# Patient Record
Sex: Male | Born: 1970
Health system: Southern US, Community
[De-identification: ages and names within clinical notes are randomized; demographics above are authoritative.]

## PROBLEM LIST (undated history)

## (undated) DIAGNOSIS — J45909 Unspecified asthma, uncomplicated: Secondary | ICD-10-CM

## (undated) DIAGNOSIS — R7303 Prediabetes: Secondary | ICD-10-CM

## (undated) HISTORY — DX: Prediabetes: R73.03

## (undated) HISTORY — DX: Unspecified asthma, uncomplicated: J45.909

## (undated) HISTORY — PX: APPENDECTOMY: SHX54

---

## 2000-08-24 ENCOUNTER — Emergency Department (HOSPITAL_COMMUNITY): Admission: EM | Admit: 2000-08-24 | Discharge: 2000-08-24 | Payer: Self-pay | Admitting: *Deleted

## 2001-09-13 ENCOUNTER — Emergency Department (HOSPITAL_COMMUNITY): Admission: EM | Admit: 2001-09-13 | Discharge: 2001-09-13 | Payer: Self-pay | Admitting: Emergency Medicine

## 2003-12-12 ENCOUNTER — Emergency Department (HOSPITAL_COMMUNITY): Admission: EM | Admit: 2003-12-12 | Discharge: 2003-12-12 | Payer: Self-pay | Admitting: Emergency Medicine

## 2004-05-30 ENCOUNTER — Ambulatory Visit (HOSPITAL_COMMUNITY): Admission: RE | Admit: 2004-05-30 | Discharge: 2004-05-30 | Payer: Self-pay | Admitting: Ophthalmology

## 2004-08-17 ENCOUNTER — Ambulatory Visit (HOSPITAL_COMMUNITY): Admission: RE | Admit: 2004-08-17 | Discharge: 2004-08-17 | Payer: Self-pay | Admitting: Specialist

## 2015-04-07 ENCOUNTER — Encounter (HOSPITAL_COMMUNITY): Payer: Self-pay | Admitting: Emergency Medicine

## 2015-04-07 ENCOUNTER — Emergency Department (HOSPITAL_COMMUNITY): Payer: Self-pay

## 2015-04-07 ENCOUNTER — Inpatient Hospital Stay (HOSPITAL_COMMUNITY)
Admission: EM | Admit: 2015-04-07 | Discharge: 2015-04-12 | DRG: 340 | Disposition: A | Discharge status: Home / Self Care | Payer: Self-pay | Attending: General Surgery | Admitting: General Surgery

## 2015-04-07 DIAGNOSIS — K3580 Unspecified acute appendicitis: Secondary | ICD-10-CM

## 2015-04-07 DIAGNOSIS — K381 Appendicular concretions: Secondary | ICD-10-CM | POA: Diagnosis present

## 2015-04-07 DIAGNOSIS — K5900 Constipation, unspecified: Secondary | ICD-10-CM | POA: Diagnosis present

## 2015-04-07 DIAGNOSIS — K352 Acute appendicitis with generalized peritonitis: Principal | ICD-10-CM | POA: Diagnosis present

## 2015-04-07 DIAGNOSIS — K37 Unspecified appendicitis: Secondary | ICD-10-CM | POA: Diagnosis present

## 2015-04-07 DIAGNOSIS — K3532 Acute appendicitis with perforation and localized peritonitis, without abscess: Secondary | ICD-10-CM | POA: Diagnosis present

## 2015-04-07 LAB — CBC WITH DIFFERENTIAL/PLATELET
Basophils Absolute: 0 10*3/uL (ref 0.0–0.1)
Basophils Relative: 0 %
Eosinophils Absolute: 0 10*3/uL (ref 0.0–0.7)
Eosinophils Relative: 0 %
HCT: 46 % (ref 39.0–52.0)
Hemoglobin: 16.1 g/dL (ref 13.0–17.0)
Lymphocytes Relative: 13 %
Lymphs Abs: 1.1 10*3/uL (ref 0.7–4.0)
MCH: 31 pg (ref 26.0–34.0)
MCHC: 35 g/dL (ref 30.0–36.0)
MCV: 88.6 fL (ref 78.0–100.0)
Monocytes Absolute: 0.5 10*3/uL (ref 0.1–1.0)
Monocytes Relative: 7 %
Neutro Abs: 6.5 10*3/uL (ref 1.7–7.7)
Neutrophils Relative %: 80 %
Platelets: 176 10*3/uL (ref 150–400)
RBC: 5.19 MIL/uL (ref 4.22–5.81)
RDW: 12.9 % (ref 11.5–15.5)
WBC: 8.1 10*3/uL (ref 4.0–10.5)

## 2015-04-07 LAB — COMPREHENSIVE METABOLIC PANEL
ALT: 22 U/L (ref 17–63)
AST: 26 U/L (ref 15–41)
Albumin: 4.2 g/dL (ref 3.5–5.0)
Alkaline Phosphatase: 58 U/L (ref 38–126)
Anion gap: 9 (ref 5–15)
BUN: 14 mg/dL (ref 6–20)
CO2: 24 mmol/L (ref 22–32)
Calcium: 9.2 mg/dL (ref 8.9–10.3)
Chloride: 102 mmol/L (ref 101–111)
Creatinine, Ser: 1.23 mg/dL (ref 0.61–1.24)
GFR calc Af Amer: >60 mL/min (ref >60)
GFR calc non Af Amer: >60 mL/min (ref >60)
Glucose, Bld: 124 mg/dL — ABNORMAL HIGH (ref 65–99)
Potassium: 3.7 mmol/L (ref 3.5–5.1)
Sodium: 135 mmol/L (ref 135–145)
Total Bilirubin: 0.6 mg/dL (ref 0.3–1.2)
Total Protein: 7.9 g/dL (ref 6.5–8.1)

## 2015-04-07 LAB — LIPASE, BLOOD: Lipase: 27 U/L (ref 22–51)

## 2015-04-07 LAB — LACTIC ACID, PLASMA: Lactic Acid, Venous: 1.9 mmol/L (ref 0.5–2.0)

## 2015-04-07 LAB — POC OCCULT BLOOD, ED: Fecal Occult Bld: POSITIVE — AB

## 2015-04-07 MED ORDER — HYDROMORPHONE HCL 1 MG/ML IJ SOLN
1.0000 mg | Freq: Once | INTRAMUSCULAR | Status: AC
  Administered 2015-04-07: 1 mg via INTRAVENOUS
  Filled 2015-04-07: qty 1

## 2015-04-07 MED ORDER — MORPHINE SULFATE (PF) 2 MG/ML IV SOLN
1.0000 mg | INTRAVENOUS | Status: DC | PRN
  Administered 2015-04-07 – 2015-04-08 (×6): 2 mg via INTRAVENOUS
  Administered 2015-04-08: 4 mg via INTRAVENOUS
  Filled 2015-04-07: qty 2
  Filled 2015-04-07 (×6): qty 1

## 2015-04-07 MED ORDER — ONDANSETRON HCL 4 MG/2ML IJ SOLN
4.0000 mg | Freq: Once | INTRAMUSCULAR | Status: AC
  Administered 2015-04-07: 4 mg via INTRAVENOUS
  Filled 2015-04-07: qty 2

## 2015-04-07 MED ORDER — SODIUM CHLORIDE 0.9 % IV BOLUS (SEPSIS)
1000.0000 mL | Freq: Once | INTRAVENOUS | Status: AC
  Administered 2015-04-07: 1000 mL via INTRAVENOUS

## 2015-04-07 MED ORDER — ONDANSETRON 4 MG PO TBDP
4.0000 mg | ORAL_TABLET | Freq: Four times a day (QID) | ORAL | Status: DC | PRN
  Administered 2015-04-08: 4 mg via ORAL
  Filled 2015-04-07: qty 1

## 2015-04-07 MED ORDER — IOHEXOL 300 MG/ML  SOLN
25.0000 mL | Freq: Once | INTRAMUSCULAR | Status: AC | PRN
  Administered 2015-04-07: 25 mL via ORAL

## 2015-04-07 MED ORDER — METRONIDAZOLE IN NACL 5-0.79 MG/ML-% IV SOLN
500.0000 mg | Freq: Three times a day (TID) | INTRAVENOUS | Status: DC
Start: 2015-04-07 — End: 2015-04-12
  Administered 2015-04-07 – 2015-04-12 (×14): 500 mg via INTRAVENOUS
  Filled 2015-04-07 (×15): qty 100

## 2015-04-07 MED ORDER — IOHEXOL 300 MG/ML  SOLN
100.0000 mL | Freq: Once | INTRAMUSCULAR | Status: AC | PRN
  Administered 2015-04-07: 100 mL via INTRAVENOUS

## 2015-04-07 MED ORDER — KCL IN DEXTROSE-NACL 20-5-0.9 MEQ/L-%-% IV SOLN
INTRAVENOUS | Status: DC
Start: 2015-04-07 — End: 2015-04-12
  Administered 2015-04-07 – 2015-04-08 (×2): via INTRAVENOUS
  Administered 2015-04-09: 100 mL/h via INTRAVENOUS
  Administered 2015-04-10: 12:00:00 via INTRAVENOUS
  Administered 2015-04-10: 100 mL/h via INTRAVENOUS
  Administered 2015-04-11: 1000 mL via INTRAVENOUS
  Administered 2015-04-12: 07:00:00 via INTRAVENOUS
  Filled 2015-04-07 (×9): qty 1000

## 2015-04-07 MED ORDER — ONDANSETRON HCL 4 MG/2ML IJ SOLN
4.0000 mg | Freq: Four times a day (QID) | INTRAMUSCULAR | Status: DC | PRN
  Administered 2015-04-08 (×2): 4 mg via INTRAVENOUS
  Filled 2015-04-07: qty 2

## 2015-04-07 MED ORDER — FENTANYL CITRATE (PF) 100 MCG/2ML IJ SOLN
100.0000 ug | Freq: Once | INTRAMUSCULAR | Status: AC
  Administered 2015-04-07: 100 ug via INTRAVENOUS
  Filled 2015-04-07: qty 2

## 2015-04-07 MED ORDER — DEXTROSE 5 % IV SOLN
2.0000 g | Freq: Every day | INTRAVENOUS | Status: DC
  Administered 2015-04-07 – 2015-04-11 (×5): 2 g via INTRAVENOUS
  Filled 2015-04-07 (×5): qty 2

## 2015-04-07 MED ORDER — PANTOPRAZOLE SODIUM 40 MG IV SOLR
40.0000 mg | Freq: Every day | INTRAVENOUS | Status: DC
  Administered 2015-04-07 – 2015-04-10 (×4): 40 mg via INTRAVENOUS
  Filled 2015-04-07 (×5): qty 40

## 2015-04-07 NOTE — Progress Notes (Signed)
EDCM spoke to patient at bedside. Patient confirms he does not have a pcp or insurance living in [place].  EDCM provided patient with contact information to [facility], informed patient of services there.  EDCM also provided patient with list of pcps who accept self pay patients, list of discount pharmacies and websites needymeds.org and GoodRX.com for medication assistance, phone number to inquire about the orange card, phone number to inquire about [employer], phone number to inquire about the Affordable Care Act, financial resources in the community such as local churches, [employer], [employer]ries, and dental assistance for uninsured patients.  Patient thankful for resources.  No further EDCM needs at this time. 

## 2015-04-07 NOTE — ED Notes (Signed)
Two unsuccessful IV attempts by this writer.  

## 2015-04-07 NOTE — H&P (Signed)
Shane Fleming is an 44 y.o. male.   Chief Complaint: abdominal pain HPI: The pt is a 44yo bm who presents with abdominal pain that started Monday night. He had one episode of vomiting yesterday. He says he had a fever at home. CT shows appendicitis but no abscess or perforation.  History reviewed. No pertinent past medical history.  History reviewed. No pertinent past surgical history.  No family history on file. Social History:  reports that he has never smoked. He does not have any smokeless tobacco history on file. He reports that he does not drink alcohol or use illicit drugs.  Allergies: No Known Allergies   (Not in a hospital admission)  Results for orders placed or performed during the hospital encounter of 04/07/15 (from the past 48 hour(s))  POC occult blood, ED Provider will collect     Status: Abnormal   Collection Time: 04/07/15  6:15 PM  Result Value Ref Range   Fecal Occult Bld POSITIVE (A) NEGATIVE  CBC with Differential     Status: None   Collection Time: 04/07/15  7:04 PM  Result Value Ref Range   WBC 8.1 4.0 - 10.5 K/uL   RBC 5.19 4.22 - 5.81 MIL/uL   Hemoglobin 16.1 13.0 - 17.0 g/dL   HCT 46.0 39.0 - 52.0 %   MCV 88.6 78.0 - 100.0 fL   MCH 31.0 26.0 - 34.0 pg   MCHC 35.0 30.0 - 36.0 g/dL   RDW 12.9 11.5 - 15.5 %   Platelets 176 150 - 400 K/uL   Neutrophils Relative % 80 %   Neutro Abs 6.5 1.7 - 7.7 K/uL   Lymphocytes Relative 13 %   Lymphs Abs 1.1 0.7 - 4.0 K/uL   Monocytes Relative 7 %   Monocytes Absolute 0.5 0.1 - 1.0 K/uL   Eosinophils Relative 0 %   Eosinophils Absolute 0.0 0.0 - 0.7 K/uL   Basophils Relative 0 %   Basophils Absolute 0.0 0.0 - 0.1 K/uL  Comprehensive metabolic panel     Status: Abnormal   Collection Time: 04/07/15  7:04 PM  Result Value Ref Range   Sodium 135 135 - 145 mmol/L   Potassium 3.7 3.5 - 5.1 mmol/L   Chloride 102 101 - 111 mmol/L   CO2 24 22 - 32 mmol/L   Glucose, Bld 124 (H) 65 - 99 mg/dL   BUN 14 6 - 20 mg/dL    Creatinine, Ser 1.23 0.61 - 1.24 mg/dL   Calcium 9.2 8.9 - 10.3 mg/dL   Total Protein 7.9 6.5 - 8.1 g/dL   Albumin 4.2 3.5 - 5.0 g/dL   AST 26 15 - 41 U/L   ALT 22 17 - 63 U/L   Alkaline Phosphatase 58 38 - 126 U/L   Total Bilirubin 0.6 0.3 - 1.2 mg/dL   GFR calc non Af Amer >60 >60 mL/min   GFR calc Af Amer >60 >60 mL/min    Comment: (NOTE) The eGFR has been calculated using the CKD EPI equation. This calculation has not been validated in all clinical situations. eGFR's persistently <60 mL/min signify possible Chronic Kidney Disease.    Anion gap 9 5 - 15  Lactic acid, plasma     Status: None   Collection Time: 04/07/15  7:04 PM  Result Value Ref Range   Lactic Acid, Venous 1.9 0.5 - 2.0 mmol/L  Lipase, blood     Status: None   Collection Time: 04/07/15  7:04 PM  Result Value Ref Range  Lipase 27 22 - 51 U/L   Ct Abdomen Pelvis W Contrast  04/07/2015   CLINICAL DATA:  Constipation for 4 days now with bilateral lower abdominal quadrant pain.  EXAM: CT ABDOMEN AND PELVIS WITH CONTRAST  TECHNIQUE: Multidetector CT imaging of the abdomen and pelvis was performed using the standard protocol following bolus administration of intravenous contrast.  CONTRAST:  58mL OMNIPAQUE IOHEXOL 300 MG/ML SOLN, 161mL OMNIPAQUE IOHEXOL 300 MG/ML SOLN  COMPARISON:  None.  FINDINGS: Normal hepatic contour. There is a minimal amount of focal fatty infiltration adjacent to the fissure for ligamentum teres. No discrete hepatic lesions. Normal appearance of the gallbladder given degree distention. No radiopaque gallstones. No intra or extrahepatic biliary duct dilatation. No ascites.  There is symmetric enhancement and excretion of the bilateral kidneys. No definite renal stones on this postcontrast examination. No discrete renal lesions. No urinary obstruction or perinephric stranding. Normal appearance of the bilateral adrenal glands, pancreas and spleen.  Ingested enteric contrast extends to the level of the  cecum. There is a large (at least 11 x 5 mm) appendicolith within the mid aspect of the appendix. This finding is associated with periappendiceal dilatation and adjacent mesenteric stranding. There is a small amount of fluid seen within the pelvic cul-de-sac (image 80, series 2) without definable/drainable fluid collection. No pneumoperitoneum, pneumatosis or portal venous gas. Moderate colonic stool burden without evidence of enteric obstruction.  Normal caliber the abdominal aorta. The major branch vessels of the abdominal aorta appear widely patent on this non CTA examination. Incidental note is made of a early bifurcation of the right renal artery.  No bulky retroperitoneal, mesenteric, pelvic or inguinal lymphadenopathy.  Normal appearance of the pelvic organs. Normal appearance of the urinary bladder given degree distention.  Limited visualization of lower thorax demonstrates minimal dependent subpleural ground-glass atelectasis. No focal airspace opacities. No pleural effusion.  Normal heart size.  No pericardial effusion.  No acute or aggressive osseous abnormalities.  Tiny mesenteric fat containing periumbilical hernia.  IMPRESSION: Findings compatible with acute uncomplicated appendicitis secondary to a large (approximately 11 mm) appendicolith within the mid aspect of the appendix. There is adjacent mesenteric stranding and small amount of fluid within pelvic cul-de-sac without evidence of perforation or definable/drainable fluid collection.   Electronically Signed   By: Sandi Mariscal M.D.   On: 04/07/2015 21:02    Review of Systems  Constitutional: Positive for fever.  HENT: Negative.   Eyes: Negative.   Respiratory: Negative.   Cardiovascular: Negative.   Gastrointestinal: Positive for nausea, vomiting and abdominal pain.  Genitourinary: Negative.   Musculoskeletal: Negative.   Skin: Negative.   Neurological: Negative.   Endo/Heme/Allergies: Negative.   Psychiatric/Behavioral: Negative.      Blood pressure 120/63, pulse 97, temperature 98.8 F (37.1 C), temperature source Oral, resp. rate 22, weight 77.111 kg (170 lb), SpO2 95 %. Physical Exam  Constitutional: He is oriented to person, place, and time. He appears well-developed and well-nourished.  HENT:  Head: Normocephalic and atraumatic.  Eyes: Conjunctivae and EOM are normal. Pupils are equal, round, and reactive to light.  Neck: Normal range of motion. Neck supple.  Cardiovascular: Normal rate, regular rhythm and normal heart sounds.   Respiratory: Effort normal and breath sounds normal.  GI: Soft.  There is mild diffuse tenderness, worse in RLQ  Musculoskeletal: Normal range of motion.  Neurological: He is alert and oriented to person, place, and time.  Skin: Skin is warm and dry.  Psychiatric: He has a normal mood  and affect. His behavior is normal.     Assessment/Plan The patient appears to have acute appendicitis. Because of the risk of perforation and sepsis I think he would benefit from having his appendix removed. I have discussed with him the risks and benefits of surgery as well as some of the technical aspects and he understands and wishes to proceed. Will admit and start on IV abx. Plan for surgery in am  TOTH III,Takao Lizer S 04/07/2015, 10:00 PM

## 2015-04-07 NOTE — ED Notes (Addendum)
Pt presents from home via EMS for constipation x4 days. C/o RLQ & LLQ abdominal pain, tender to palpation.  Last Vs 128/86, 98hr, [facility]

## 2015-04-07 NOTE — ED Provider Notes (Signed)
CSN: [id number]     Arrival date & time 04/07/15  1745 History   First MD Initiated Contact with Patient 04/07/15 1748     Chief Complaint  Patient presents with  . Abdominal Pain  . Constipation     (Consider location/radiation/quality/duration/timing/severity/associated sxs/prior Treatment) Patient is a 44 y.o. male presenting with abdominal pain.  Abdominal Pain Pain location:  LLQ and RLQ Pain quality: sharp   Pain radiates to:  Does not radiate Pain severity:  Mild Onset quality:  Gradual Duration:  2 days Timing:  Constant Progression:  Worsening Chronicity:  New Relieved by:  None tried Worsened by:  Nothing tried Ineffective treatments:  None tried Associated symptoms: constipation and nausea   Associated symptoms: no chills, no fever and no vomiting     History reviewed. No pertinent past medical history. History reviewed. No pertinent past surgical history. No family history on file. Social History  Substance Use Topics  . Smoking status: Never Smoker   . Smokeless tobacco: None  . Alcohol Use: No    Review of Systems  Constitutional: Negative for fever and chills.  Eyes: Negative for pain.  Gastrointestinal: Positive for nausea, abdominal pain and constipation. Negative for vomiting.  Endocrine: Negative for polydipsia and polyuria.  All other systems reviewed and are negative.     Allergies  Review of patient's allergies indicates no known allergies.  Home Medications   Prior to Admission medications   Not on File   BP 116/79 mmHg  Pulse 107  Temp(Src) 97.5 F (36.4 C) (Oral)  Resp 18  Ht  (1.803 m)  Wt 170 lb (77.111 kg)  BMI 23.72 kg/m2  SpO2 95% Physical Exam  Constitutional: He is oriented to person, place, and time. He appears well-developed and well-nourished.  HENT:  Head: Normocephalic and atraumatic.  Eyes: Conjunctivae and EOM are normal.  Neck: Normal range of motion. Neck supple.  Cardiovascular: Normal rate and  regular rhythm.   Pulmonary/Chest: Effort normal. No respiratory distress.  Abdominal: Soft. There is no tenderness.  Genitourinary: Guaiac positive stool.  Musculoskeletal: Normal range of motion. He exhibits no edema or tenderness.  Neurological: He is alert and oriented to person, place, and time.  Skin: Skin is warm and dry.  Nursing note and vitals reviewed.   ED Course  Procedures (including critical care time) Labs Review Labs Reviewed  COMPREHENSIVE METABOLIC PANEL - Abnormal; Notable for the following:    Glucose, Bld 124 (*)    All other components within normal limits  POC OCCULT BLOOD, ED - Abnormal; Notable for the following:    Fecal Occult Bld POSITIVE (*)    All other components within normal limits  CBC WITH DIFFERENTIAL/PLATELET  LACTIC ACID, PLASMA  LIPASE, BLOOD    Imaging Review Ct Abdomen Pelvis W Contrast  04/07/2015   CLINICAL DATA:  Constipation for 4 days now with bilateral lower abdominal quadrant pain.  EXAM: CT ABDOMEN AND PELVIS WITH CONTRAST  TECHNIQUE: Multidetector CT imaging of the abdomen and pelvis was performed using the standard protocol following bolus administration of intravenous contrast.  CONTRAST:  25mL OMNIPAQUE IOHEXOL 300 MG/ML SOLN, OMNIPAQUE IOHEXOL 300 MG/ML SOLN  COMPARISON:  None.  FINDINGS: Normal hepatic contour. There is a minimal amount of focal fatty infiltration adjacent to the fissure for ligamentum teres. No discrete hepatic lesions. Normal appearance of the gallbladder given degree distention. No radiopaque gallstones. No intra or extrahepatic biliary duct dilatation. No ascites.  There is symmetric enhancement and excretion  of the bilateral kidneys. No definite renal stones on this postcontrast examination. No discrete renal lesions. No urinary obstruction or perinephric stranding. Normal appearance of the bilateral adrenal glands, pancreas and spleen.  Ingested enteric contrast extends to the level of the cecum. There is  a large (at least 11 x 5 mm) appendicolith within the mid aspect of the appendix. This finding is associated with periappendiceal dilatation and adjacent mesenteric stranding. There is a small amount of fluid seen within the pelvic cul-de-sac (image 80, series 2) without definable/drainable fluid collection. No pneumoperitoneum, pneumatosis or portal venous gas. Moderate colonic stool burden without evidence of enteric obstruction.  Normal caliber the abdominal aorta. The major branch vessels of the abdominal aorta appear widely patent on this non CTA examination. Incidental note is made of a early bifurcation of the right renal artery.  No bulky retroperitoneal, mesenteric, pelvic or inguinal lymphadenopathy.  Normal appearance of the pelvic organs. Normal appearance of the urinary bladder given degree distention.  Limited visualization of lower thorax demonstrates minimal dependent subpleural ground-glass atelectasis. No focal airspace opacities. No pleural effusion.  Normal heart size.  No pericardial effusion.  No acute or aggressive osseous abnormalities.  Tiny mesenteric fat containing periumbilical hernia.  IMPRESSION: Findings compatible with acute uncomplicated appendicitis secondary to a large (approximately 11 mm) appendicolith within the mid aspect of the appendix. There is adjacent mesenteric stranding and small amount of fluid within pelvic cul-de-sac without evidence of perforation or definable/drainable fluid collection.   Electronically Signed   By: Simonne Come M.D.   On: 04/07/2015 21:02   I have personally reviewed and evaluated these images and lab results as part of my medical decision-making.   EKG Interpretation None      MDM   Final diagnoses:  Acute appendicitis, unspecified acute appendicitis type    44 yo M w/ acute appendicitis. Fluids/pain meds provided. Surgery consulted and admitted.      Marily Memos, MD 04/08/15 [phone]

## 2015-04-07 NOTE — ED Notes (Signed)
Bed: ZO10 Expected date:  Expected time:  Means of arrival:  Comments: EMS -Constipation

## 2015-04-07 NOTE — ED Notes (Signed)
IV  Team is in room collect labs

## 2015-04-08 ENCOUNTER — Encounter (HOSPITAL_COMMUNITY): Payer: Self-pay | Admitting: Anesthesiology

## 2015-04-08 ENCOUNTER — Encounter (HOSPITAL_COMMUNITY): Admission: EM | Disposition: A | Discharge status: Home / Self Care | Payer: Self-pay | Source: Home / Self Care

## 2015-04-08 ENCOUNTER — Observation Stay (HOSPITAL_COMMUNITY): Payer: Self-pay | Admitting: Anesthesiology

## 2015-04-08 DIAGNOSIS — K3532 Acute appendicitis with perforation and localized peritonitis, without abscess: Secondary | ICD-10-CM | POA: Diagnosis present

## 2015-04-08 HISTORY — PX: LAPAROSCOPIC APPENDECTOMY: SHX408

## 2015-04-08 LAB — BASIC METABOLIC PANEL
Anion gap: 9 (ref 5–15)
BUN: 18 mg/dL (ref 6–20)
CHLORIDE: 105 mmol/L (ref 101–111)
CO2: 23 mmol/L (ref 22–32)
Calcium: 8.3 mg/dL — ABNORMAL LOW (ref 8.9–10.3)
Creatinine, Ser: 1.4 mg/dL — ABNORMAL HIGH (ref 0.61–1.24)
GFR calc Af Amer: >60 mL/min (ref >60)
GFR, EST NON AFRICAN AMERICAN: 59 mL/min — AB (ref >60)
GLUCOSE: 120 mg/dL — AB (ref 65–99)
POTASSIUM: 3.8 mmol/L (ref 3.5–5.1)
Sodium: 137 mmol/L (ref 135–145)

## 2015-04-08 LAB — CBC
HEMATOCRIT: 42.1 % (ref 39.0–52.0)
Hemoglobin: 14.3 g/dL (ref 13.0–17.0)
MCH: 30.4 pg (ref 26.0–34.0)
MCHC: 34 g/dL (ref 30.0–36.0)
MCV: 89.4 fL (ref 78.0–100.0)
Platelets: 171 10*3/uL (ref 150–400)
RBC: 4.71 MIL/uL (ref 4.22–5.81)
RDW: 13 % (ref 11.5–15.5)
WBC: 7.8 10*3/uL (ref 4.0–10.5)

## 2015-04-08 LAB — SURGICAL PCR SCREEN
MRSA, PCR: NEGATIVE
Staphylococcus aureus: NEGATIVE

## 2015-04-08 SURGERY — APPENDECTOMY, LAPAROSCOPIC
Anesthesia: General

## 2015-04-08 MED ORDER — LACTATED RINGERS IV SOLN
INTRAVENOUS | Status: DC | PRN
  Administered 2015-04-08: 1000 mL
  Administered 2015-04-08 (×2): via INTRAVENOUS

## 2015-04-08 MED ORDER — FENTANYL CITRATE (PF) 250 MCG/5ML IJ SOLN
INTRAMUSCULAR | Status: AC
  Filled 2015-04-08: qty 5

## 2015-04-08 MED ORDER — PHENYLEPHRINE 40 MCG/ML (10ML) SYRINGE FOR IV PUSH (FOR BLOOD PRESSURE SUPPORT)
PREFILLED_SYRINGE | INTRAVENOUS | Status: AC
  Filled 2015-04-08: qty 10

## 2015-04-08 MED ORDER — PHENYLEPHRINE HCL 10 MG/ML IJ SOLN
INTRAMUSCULAR | Status: DC | PRN
  Administered 2015-04-08 (×4): 80 ug via INTRAVENOUS

## 2015-04-08 MED ORDER — DEXAMETHASONE SODIUM PHOSPHATE 10 MG/ML IJ SOLN
INTRAMUSCULAR | Status: DC | PRN
  Administered 2015-04-08: 10 mg via INTRAVENOUS

## 2015-04-08 MED ORDER — LIDOCAINE HCL (CARDIAC) 20 MG/ML IV SOLN
INTRAVENOUS | Status: DC | PRN
  Administered 2015-04-08: 50 mg via INTRAVENOUS

## 2015-04-08 MED ORDER — OXYCODONE-ACETAMINOPHEN 5-325 MG PO TABS
1.0000 | ORAL_TABLET | ORAL | Status: DC | PRN
  Administered 2015-04-10 – 2015-04-12 (×4): 1 via ORAL
  Filled 2015-04-08 (×5): qty 1

## 2015-04-08 MED ORDER — METOCLOPRAMIDE HCL 5 MG/ML IJ SOLN
INTRAMUSCULAR | Status: AC
  Filled 2015-04-08: qty 2

## 2015-04-08 MED ORDER — FENTANYL CITRATE (PF) 100 MCG/2ML IJ SOLN: 25.0000 ug | INTRAMUSCULAR | Status: DC | PRN

## 2015-04-08 MED ORDER — ROCURONIUM BROMIDE 100 MG/10ML IV SOLN
INTRAVENOUS | Status: DC | PRN
  Administered 2015-04-08: 5 mg via INTRAVENOUS
  Administered 2015-04-08: 35 mg via INTRAVENOUS

## 2015-04-08 MED ORDER — PROMETHAZINE HCL 25 MG/ML IJ SOLN: 6.2500 mg | INTRAMUSCULAR | Status: DC | PRN

## 2015-04-08 MED ORDER — LIDOCAINE HCL (CARDIAC) 20 MG/ML IV SOLN
INTRAVENOUS | Status: AC
  Filled 2015-04-08: qty 10

## 2015-04-08 MED ORDER — MIDAZOLAM HCL 2 MG/2ML IJ SOLN
INTRAMUSCULAR | Status: AC
  Filled 2015-04-08: qty 2

## 2015-04-08 MED ORDER — BUPIVACAINE-EPINEPHRINE (PF) 0.25% -1:200000 IJ SOLN
INTRAMUSCULAR | Status: AC
  Filled 2015-04-08: qty 30

## 2015-04-08 MED ORDER — MEPERIDINE HCL 50 MG/ML IJ SOLN: 6.2500 mg | INTRAMUSCULAR | Status: DC | PRN

## 2015-04-08 MED ORDER — LACTATED RINGERS IV SOLN: INTRAVENOUS | Status: DC

## 2015-04-08 MED ORDER — DEXAMETHASONE SODIUM PHOSPHATE 10 MG/ML IJ SOLN
INTRAMUSCULAR | Status: AC
  Filled 2015-04-08: qty 1

## 2015-04-08 MED ORDER — FENTANYL CITRATE (PF) 100 MCG/2ML IJ SOLN
INTRAMUSCULAR | Status: DC | PRN
  Administered 2015-04-08 (×3): 50 ug via INTRAVENOUS
  Administered 2015-04-08: 100 ug via INTRAVENOUS

## 2015-04-08 MED ORDER — MIDAZOLAM HCL 5 MG/5ML IJ SOLN
INTRAMUSCULAR | Status: DC | PRN
  Administered 2015-04-08: 2 mg via INTRAVENOUS

## 2015-04-08 MED ORDER — BUPIVACAINE-EPINEPHRINE 0.25% -1:200000 IJ SOLN
INTRAMUSCULAR | Status: DC | PRN
  Administered 2015-04-08: 14 mL

## 2015-04-08 MED ORDER — SUGAMMADEX SODIUM 200 MG/2ML IV SOLN
INTRAVENOUS | Status: DC | PRN
  Administered 2015-04-08: 200 mg via INTRAVENOUS

## 2015-04-08 MED ORDER — PROPOFOL 10 MG/ML IV BOLUS
INTRAVENOUS | Status: DC | PRN
  Administered 2015-04-08: 180 mg via INTRAVENOUS

## 2015-04-08 MED ORDER — SUCCINYLCHOLINE CHLORIDE 20 MG/ML IJ SOLN
INTRAMUSCULAR | Status: DC | PRN
  Administered 2015-04-08: 100 mg via INTRAVENOUS

## 2015-04-08 SURGICAL SUPPLY — 39 items
APPLIER CLIP 5 13 M/L LIGAMAX5 (MISCELLANEOUS)
APPLIER CLIP ROT 10 11.4 M/L (STAPLE)
APR CLP MED LRG 11.4X10 (STAPLE)
APR CLP MED LRG 5 ANG JAW (MISCELLANEOUS)
BAG SPEC RTRVL LRG 6X4 10 (ENDOMECHANICALS) ×1
CABLE HIGH FREQUENCY MONO STRZ (ELECTRODE) IMPLANT
CHLORAPREP W/TINT 26ML (MISCELLANEOUS) ×3 IMPLANT
CLIP APPLIE 5 13 M/L LIGAMAX5 (MISCELLANEOUS) IMPLANT
CLIP APPLIE ROT 10 11.4 M/L (STAPLE) IMPLANT
COVER SURGICAL LIGHT HANDLE (MISCELLANEOUS) ×2 IMPLANT
CUTTER FLEX LINEAR 45M (STAPLE) ×2 IMPLANT
DECANTER SPIKE VIAL GLASS SM (MISCELLANEOUS) ×3 IMPLANT
DRAPE LAPAROSCOPIC ABDOMINAL (DRAPES) ×5 IMPLANT
ELECT REM PT RETURN 9FT ADLT (ELECTROSURGICAL) ×3
ELECTRODE REM PT RTRN 9FT ADLT (ELECTROSURGICAL) ×1 IMPLANT
EVACUATOR SILICONE 100CC (DRAIN) ×2 IMPLANT
GLOVE BIOGEL PI IND STRL 7.5 (GLOVE) ×1 IMPLANT
GLOVE BIOGEL PI INDICATOR 7.5 (GLOVE) ×2
GLOVE ECLIPSE 7.5 STRL STRAW (GLOVE) ×3 IMPLANT
GOWN STRL REUS W/TWL XL LVL3 (GOWN DISPOSABLE) ×6 IMPLANT
KIT BASIN OR (CUSTOM PROCEDURE TRAY) ×3 IMPLANT
LIQUID BAND (GAUZE/BANDAGES/DRESSINGS) IMPLANT
POUCH SPECIMEN RETRIEVAL 10MM (ENDOMECHANICALS) ×3 IMPLANT
RELOAD 45 VASCULAR/THIN (ENDOMECHANICALS) IMPLANT
RELOAD STAPLE 45 2.5 WHT GRN (ENDOMECHANICALS) IMPLANT
RELOAD STAPLE 45 3.5 BLU ETS (ENDOMECHANICALS) IMPLANT
RELOAD STAPLE TA45 3.5 REG BLU (ENDOMECHANICALS) ×3 IMPLANT
SCISSORS LAP 5X35 DISP (ENDOMECHANICALS) ×3 IMPLANT
SET IRRIG TUBING LAPAROSCOPIC (IRRIGATION / IRRIGATOR) ×3 IMPLANT
SHEARS HARMONIC ACE PLUS 36CM (ENDOMECHANICALS) ×3 IMPLANT
SLEEVE XCEL OPT CAN 5 100 (ENDOMECHANICALS) ×3 IMPLANT
SUT ETHILON 2 0 PS N (SUTURE) ×2 IMPLANT
SUT MNCRL AB 4-0 PS2 18 (SUTURE) ×3 IMPLANT
TOWEL OR 17X26 10 PK STRL BLUE (TOWEL DISPOSABLE) ×3 IMPLANT
TRAY FOLEY W/METER SILVER 14FR (SET/KITS/TRAYS/PACK) ×1 IMPLANT
TRAY FOLEY W/METER SILVER 16FR (SET/KITS/TRAYS/PACK) ×3 IMPLANT
TRAY LAPAROSCOPIC (CUSTOM PROCEDURE TRAY) ×3 IMPLANT
TROCAR BLADELESS OPT 5 100 (ENDOMECHANICALS) ×3 IMPLANT
TROCAR XCEL BLUNT TIP 100MML (ENDOMECHANICALS) ×3 IMPLANT

## 2015-04-08 NOTE — Anesthesia Postprocedure Evaluation (Signed)
  Anesthesia Post-op Note  Patient: Shane Fleming  Procedure(s) Performed: Procedure(s) (LRB): APPENDECTOMY LAPAROSCOPIC (N/A)  Patient Location: PACU  Anesthesia Type: General  Level of Consciousness: awake and alert   Airway and Oxygen Therapy: Patient Spontanous Breathing  Post-op Pain: mild  Post-op Assessment: Post-op Vital signs reviewed, Patient's Cardiovascular Status Stable, Respiratory Function Stable, Patent Airway and No signs of Nausea or vomiting  Last Vitals:  Filed Vitals:   04/08/15 1237  BP: 116/71  Pulse: 91  Temp: 37 C  Resp: 15    Post-op Vital Signs: stable   Complications: No apparent anesthesia complications

## 2015-04-08 NOTE — Op Note (Signed)
Preoperative Diagnosis: abd pain appendicitis  Postoprative Diagnosis: Perforated appendicitis with localized peritonitis  Procedure: Procedure(s): APPENDECTOMY LAPAROSCOPIC   Surgeon: Glenna Fellows T   Assistants: None  Anesthesia:  General endotracheal anesthesia  Indications: Patient is a 44 year old male who presents with 5 days of persistent and worsening lower abdominal pain right greater than left. CT scan has shown evidence of appendicitis without apparent perforation. He has somewhat diffuse lower abdominal tenderness greater on the right. White blood count is normal and vital signs within normal limits. We've recommended proceeding with emergency laparoscopic appendectomy. I discussed the indications and nature of the procedure as well as risks of anesthetic complications, bleeding, infection or possible need for open procedure. The patient understands and agrees to proceed.  Procedure Detail:  Patient was brought to the operating room, placed in the supine position on the operating table, and general endotracheal anesthesia induced. He had received broad spectrum preoperative IV antibiotics. PAS were in place. Foley catheter was placed. The abdomen was widely sterilely prepped and draped. Patient timeout was performed and correct procedure verified. Access was obtained with an open Hassan technique with a 12 mm trocar at the umbilicus through a mattress suture of 0 Vicryl. Pneumoperitoneum was established. Under direct vision 5 mm trochars were placed in the right upper quadrant and the left lower quadrant. The tip of the cecum and base of the appendix were identified which appeared relatively normal. The appendix was densely adherent along the posterior lateral pelvic side wall and as we exposed this there was a fair amount of thick purulent fluid and an obviously necrotic and perforated area at the mid appendix. There was an appendicolith already lying adjacent to the appendix which  was grasped and removed completely. I was able to mobilize the distal appendix up out of the pelvis but the mesial appendix was very thick and fixed down posteriorly. I therefore decided to remove the appendix retrograde. The mesial appendix was dissected away from the appendix at its base with the Harmonic scalpel. I then divided the appendix at its base across the tip of the cecum with a single firing of the Endo GIA 45 mm blue load stapler. The staple line was intact and without bleeding. I was then able to elevate the proximal appendix and expose the mesial appendix which was sequentially divided with the Harmonic scalpel working distally. As I approach the area of the perforation I found the appendix was completely necrotic and actually separated in this area. The proximal one half of the appendix was freed and placed in an Endo Catch bag and brought out through the umbilical incision. I then exposed the distal appendix and mesoappendix and divided the Muse appendix with the Harmonic scalpel and the distal half of the appendix was completely removed and placed in Endo Catch bag and brought out through the umbilicus. There was some frankly purulent fluid between loops of terminal ileum in the pelvis. All these inflammatory lesions were completely broken up and the fluid suctioned and the abdomen was irrigated with several liters of saline until clear. There was no bleeding. No evidence of trocar injury or other problems. A 19 Blake closed suction drain was placed in the right pelvis where the appendix had been lying and was brought out through the right upper quadrant 5 mm trocar site. All CO2 was evacuated and trochars removed and the mattress suture secured at the umbilicus. Skin incisions were closed with subcutaneous Monocryl and Dermabond. Sponge needle and instrument counts were correct.  Findings: Acute appendicitis with perforation and localized peritonitis  Estimated Blood Loss:  Minimal          Drains: 19 Blake in right upper quadrant with distal and then right pelvis  Blood Given: none          Specimens: Appendix and appendicolith        Complications:  * No complications entered in OR log *         Disposition: PACU - hemodynamically stable.         Condition: stable

## 2015-04-08 NOTE — Anesthesia Preprocedure Evaluation (Signed)
Anesthesia Evaluation  Patient identified by MRN, date of birth, ID band Patient awake    Reviewed: Allergy & Precautions, NPO status , Patient's Chart, lab work & pertinent test results  Airway Mallampati: II  TM Distance: >3 FB Neck ROM: Full    Dental no notable dental hx.    Pulmonary neg pulmonary ROS,    Pulmonary exam normal breath sounds clear to auscultation       Cardiovascular negative cardio ROS Normal cardiovascular exam Rhythm:Regular Rate:Normal     Neuro/Psych negative neurological ROS  negative psych ROS   GI/Hepatic negative GI ROS, Neg liver ROS,   Endo/Other  negative endocrine ROS  Renal/GU negative Renal ROS  negative genitourinary   Musculoskeletal negative musculoskeletal ROS (+)   Abdominal   Peds negative pediatric ROS (+)  Hematology negative hematology ROS (+)   Anesthesia Other Findings   Reproductive/Obstetrics negative OB ROS                             Anesthesia Physical Anesthesia Plan  ASA: I and emergent  Anesthesia Plan: General   Post-op Pain Management:    Induction: Intravenous  Airway Management Planned: Oral ETT  Additional Equipment:   Intra-op Plan:   Post-operative Plan: Extubation in OR  Informed Consent: I have reviewed the patients History and Physical, chart, labs and discussed the procedure including the risks, benefits and alternatives for the proposed anesthesia with the patient or authorized representative who has indicated his/her understanding and acceptance.   Dental advisory given  Plan Discussed with: CRNA  Anesthesia Plan Comments:        Anesthesia Quick Evaluation  

## 2015-04-08 NOTE — Progress Notes (Signed)
Patient ID: Shane Fleming, male   DOB: 05-Jan-1971, 44 y.o.   MRN: [id number] Day of Surgery  Subjective: Right lower quadrant pain unchanged.  Objective: Vital signs in last 24 hours: Temp:  [97.5 F (36.4 C)-99.5 F (37.5 C)] 99.4 F (37.4 C) (10/06 0740) Pulse Rate:  [81-108] 108 (10/06 0740) Resp:  [17-24] 18 (10/06 0740) BP: (95-143)/(60-81) 110/78 mmHg (10/06 0740) SpO2:  [92 %-98 %] 97 % (10/06 0740) Weight:  [77.111 kg (170 lb)] 77.111 kg (170 lb) (10/05 2300) Last BM Date: 04/04/15  Intake/Output from previous day: 10/05 0701 - 10/06 0700 In: 623.3 [I.V.:623.3] Out: -  Intake/Output this shift:    General appearance: alert, cooperative and no distress Resp: clear to auscultation bilaterally GI: ttender across lower abdomen greater on the right than the left  Lab Results:   Recent Labs  04/07/15 1904 04/08/15 0730  WBC 8.1 7.8  HGB 16.1 14.3  HCT 46.0 42.1  PLT 176 171   BMET  Recent Labs  04/07/15 1904 04/08/15 0730  NA 135 137  K 3.7 3.8  CL 102 105  CO2 24 23  GLUCOSE 124* 120*  BUN 14 18  CREATININE 1.23 1.40*  CALCIUM 9.2 8.3*     Studies/Results: Ct Abdomen Pelvis W Contrast  04/07/2015   CLINICAL DATA:  Constipation for 4 days now with bilateral lower abdominal quadrant pain.  EXAM: CT ABDOMEN AND PELVIS WITH CONTRAST  TECHNIQUE: Multidetector CT imaging of the abdomen and pelvis was performed using the standard protocol following bolus administration of intravenous contrast.  CONTRAST:  25mL OMNIPAQUE IOHEXOL 300 MG/ML SOLN, OMNIPAQUE IOHEXOL 300 MG/ML SOLN  COMPARISON:  None.  FINDINGS: Normal hepatic contour. There is a minimal amount of focal fatty infiltration adjacent to the fissure for ligamentum teres. No discrete hepatic lesions. Normal appearance of the gallbladder given degree distention. No radiopaque gallstones. No intra or extrahepatic biliary duct dilatation. No ascites.  There is symmetric enhancement and excretion of the  bilateral kidneys. No definite renal stones on this postcontrast examination. No discrete renal lesions. No urinary obstruction or perinephric stranding. Normal appearance of the bilateral adrenal glands, pancreas and spleen.  Ingested enteric contrast extends to the level of the cecum. There is a large (at least 11 x 5 mm) appendicolith within the mid aspect of the appendix. This finding is associated with periappendiceal dilatation and adjacent mesenteric stranding. There is a small amount of fluid seen within the pelvic cul-de-sac (image 80, series 2) without definable/drainable fluid collection. No pneumoperitoneum, pneumatosis or portal venous gas. Moderate colonic stool burden without evidence of enteric obstruction.  Normal caliber the abdominal aorta. The major branch vessels of the abdominal aorta appear widely patent on this non CTA examination. Incidental note is made of a early bifurcation of the right renal artery.  No bulky retroperitoneal, mesenteric, pelvic or inguinal lymphadenopathy.  Normal appearance of the pelvic organs. Normal appearance of the urinary bladder given degree distention.  Limited visualization of lower thorax demonstrates minimal dependent subpleural ground-glass atelectasis. No focal airspace opacities. No pleural effusion.  Normal heart size.  No pericardial effusion.  No acute or aggressive osseous abnormalities.  Tiny mesenteric fat containing periumbilical hernia.  IMPRESSION: Findings compatible with acute uncomplicated appendicitis secondary to a large (approximately 11 mm) appendicolith within the mid aspect of the appendix. There is adjacent mesenteric stranding and small amount of fluid within pelvic cul-de-sac without evidence of perforation or definable/drainable fluid collection.   Electronically Signed   By: Jonny Ruiz  Watts M.D.   On: 04/07/2015 21:02    Anti-infectives: Anti-infectives    Start     Dose/Rate Route Frequency Ordered Stop   04/07/15 2359   metroNIDAZOLE (FLAGYL) IVPB 500 mg     500 mg 100 mL/hr over 60 Minutes Intravenous Every 8 hours 04/07/15 2306     04/07/15 2315  cefTRIAXone (ROCEPHIN) 2 g in dextrose 5 % 50 mL IVPB     2 g 100 mL/hr over 30 Minutes Intravenous Daily at bedtime 04/07/15 2306        Assessment/Plan: Acute appendicitis. I personally reviewed the imaging. We have recommended proceeding with laparoscopic appendectomy. I discussed the nature of the procedure and indication with the patient and risks of anesthetic complications, bleeding, infection or possible need for open surgery. All his questions were answered. He agrees to proceed.       Yulianna Folse T 04/08/2015

## 2015-04-08 NOTE — Transfer of Care (Signed)
Immediate Anesthesia Transfer of Care Note  Patient: Shane Fleming  Procedure(s) Performed: Procedure(s): APPENDECTOMY LAPAROSCOPIC (N/A)  Patient Location: PACU  Anesthesia Type:General  Level of Consciousness:  sedated, patient cooperative and responds to stimulation  Airway & Oxygen Therapy:Patient Spontanous Breathing and Patient connected to face mask oxgen  Post-op Assessment:  Report given to PACU RN and Post -op Vital signs reviewed and stable  Post vital signs:  Reviewed and stable  Last Vitals:  Filed Vitals:   04/08/15 0740  BP: 110/78  Pulse: 108  Temp: 37.4 C  Resp: 18    Complications: No apparent anesthesia complications

## 2015-04-08 NOTE — Anesthesia Procedure Notes (Signed)
Procedure Name: Intubation Date/Time: 04/08/2015 9:03 AM Performed by: Ademide Schaberg, Nuala Alpha Pre-anesthesia Checklist: Patient identified, Emergency Drugs available, Suction available, Patient being monitored and Timeout performed Patient Re-evaluated:Patient Re-evaluated prior to inductionOxygen Delivery Method: Circle system utilized Preoxygenation: Pre-oxygenation with 100% oxygen Intubation Type: IV induction Ventilation: Mask ventilation without difficulty Laryngoscope Size: Mac and 4 Grade View: Grade II Tube type: Oral Tube size: 7.5 mm Number of attempts: 1 Airway Equipment and Method: Stylet Placement Confirmation: ETT inserted through vocal cords under direct vision,  positive ETCO2,  CO2 detector and breath sounds checked- equal and bilateral Secured at: 21 cm Tube secured with: Tape Dental Injury: Teeth and Oropharynx as per pre-operative assessment

## 2015-04-09 LAB — BASIC METABOLIC PANEL
Anion gap: 6 (ref 5–15)
BUN: 17 mg/dL (ref 6–20)
CHLORIDE: 110 mmol/L (ref 101–111)
CO2: 23 mmol/L (ref 22–32)
CREATININE: 1.14 mg/dL (ref 0.61–1.24)
Calcium: 8.4 mg/dL — ABNORMAL LOW (ref 8.9–10.3)
GFR calc Af Amer: >60 mL/min (ref >60)
GLUCOSE: 132 mg/dL — AB (ref 65–99)
POTASSIUM: 3.5 mmol/L (ref 3.5–5.1)
SODIUM: 139 mmol/L (ref 135–145)

## 2015-04-09 LAB — CBC
HCT: 36.5 % — ABNORMAL LOW (ref 39.0–52.0)
Hemoglobin: 12.6 g/dL — ABNORMAL LOW (ref 13.0–17.0)
MCH: 30.8 pg (ref 26.0–34.0)
MCHC: 34.5 g/dL (ref 30.0–36.0)
MCV: 89.2 fL (ref 78.0–100.0)
PLATELETS: 180 10*3/uL (ref 150–400)
RBC: 4.09 MIL/uL — AB (ref 4.22–5.81)
RDW: 13.2 % (ref 11.5–15.5)
WBC: 7.3 10*3/uL (ref 4.0–10.5)

## 2015-04-09 MED ORDER — SODIUM CHLORIDE 0.9 % IV SOLN
25.0000 mg | Freq: Once | INTRAVENOUS | Status: AC
  Administered 2015-04-09: 25 mg via INTRAVENOUS
  Filled 2015-04-09: qty 1

## 2015-04-09 NOTE — Progress Notes (Signed)
Patient ID: Shane Fleming, male   DOB: [birth year], 44 y.o.   MRN: [id number] 1 Day Post-Op  Subjective: Pt with some nausea.  Hasn't taken any clear liquids yet today.  Having some incontinence with diarrhea.  Pain is present but fairly well controlled.  Objective: Vital signs in last 24 hours: Temp:  [98.2 F (36.8 C)-98.9 F (37.2 C)] 98.2 F (36.8 C) (10/07 0554) Pulse Rate:  [78-92] 79 (10/07 0554) Resp:  [14-16] 16 (10/07 0554) BP: (96-116)/(56-74) 96/60 mmHg (10/07 0554) SpO2:  [97 %-98 %] 97 % (10/07 0554) Last BM Date: 04/05/15  Intake/Output from previous day: 10/06 0701 - 10/07 0700 In: 3428.9 [P.O.:960; I.V.:2468.9] Out: 695 [Urine:550; Drains:90; Blood:25] Intake/Output this shift:    PE: Abd: soft, but distended some, absent BS, JP drain with serosang output, incisions c/d/i Heart: regular Lungs: CTAB  Lab Results:   Recent Labs  04/08/15 0730 04/09/15 0505  WBC 7.8 7.3  HGB 14.3 12.6*  HCT 42.1 36.5*  PLT 171 180   BMET  Recent Labs  04/08/15 0730 04/09/15 0505  NA 137 139  K 3.8 3.5  CL 105 110  CO2 23 23  GLUCOSE 120* 132*  BUN 18 17  CREATININE 1.40* 1.14  CALCIUM 8.3* 8.4*   PT/INR No results for input(s): LABPROT, INR in the last 72 hours. CMP     Component Value Date/Time   NA 139 04/09/2015 0505   K 3.5 04/09/2015 0505   CL 110 04/09/2015 0505   CO2 23 04/09/2015 0505   GLUCOSE 132* 04/09/2015 0505   BUN 17 04/09/2015 0505   CREATININE 1.14 04/09/2015 0505   CALCIUM 8.4* 04/09/2015 0505   PROT 7.9 04/07/2015 1904   ALBUMIN 4.2 04/07/2015 1904   AST 26 04/07/2015 1904   ALT 22 04/07/2015 1904   ALKPHOS 58 04/07/2015 1904   BILITOT 0.6 04/07/2015 1904   GFRNONAA >60 04/09/2015 0505   GFRAA >60 04/09/2015 0505   Lipase     Component Value Date/Time   LIPASE 27 04/07/2015 1904       Studies/Results: Ct Abdomen Pelvis W Contrast  04/07/2015   CLINICAL DATA:  Constipation for 4 days now with bilateral lower  abdominal quadrant pain.  EXAM: CT ABDOMEN AND PELVIS WITH CONTRAST  TECHNIQUE: Multidetector CT imaging of the abdomen and pelvis was performed using the standard protocol following bolus administration of intravenous contrast.  CONTRAST:  25mL OMNIPAQUE IOHEXOL 300 MG/ML SOLN, OMNIPAQUE IOHEXOL 300 MG/ML SOLN  COMPARISON:  None.  FINDINGS: Normal hepatic contour. There is a minimal amount of focal fatty infiltration adjacent to the fissure for ligamentum teres. No discrete hepatic lesions. Normal appearance of the gallbladder given degree distention. No radiopaque gallstones. No intra or extrahepatic biliary duct dilatation. No ascites.  There is symmetric enhancement and excretion of the bilateral kidneys. No definite renal stones on this postcontrast examination. No discrete renal lesions. No urinary obstruction or perinephric stranding. Normal appearance of the bilateral adrenal glands, pancreas and spleen.  Ingested enteric contrast extends to the level of the cecum. There is a large (at least 11 x 5 mm) appendicolith within the mid aspect of the appendix. This finding is associated with periappendiceal dilatation and adjacent mesenteric stranding. There is a small amount of fluid seen within the pelvic cul-de-sac (image 80, series 2) without definable/drainable fluid collection. No pneumoperitoneum, pneumatosis or portal venous gas. Moderate colonic stool burden without evidence of enteric obstruction.  Normal caliber the abdominal aorta. The major branch vessels  of the abdominal aorta appear widely patent on this non CTA examination. Incidental note is made of a early bifurcation of the right renal artery.  No bulky retroperitoneal, mesenteric, pelvic or inguinal lymphadenopathy.  Normal appearance of the pelvic organs. Normal appearance of the urinary bladder given degree distention.  Limited visualization of lower thorax demonstrates minimal dependent subpleural ground-glass atelectasis. No focal  airspace opacities. No pleural effusion.  Normal heart size.  No pericardial effusion.  No acute or aggressive osseous abnormalities.  Tiny mesenteric fat containing periumbilical hernia.  IMPRESSION: Findings compatible with acute uncomplicated appendicitis secondary to a large (approximately 11 mm) appendicolith within the mid aspect of the appendix. There is adjacent mesenteric stranding and small amount of fluid within pelvic cul-de-sac without evidence of perforation or definable/drainable fluid collection.   Electronically Signed   By: Simonne Come M.D.   On: 04/07/2015 21:02    Anti-infectives: Anti-infectives    Start     Dose/Rate Route Frequency Ordered Stop   04/07/15 2359  metroNIDAZOLE (FLAGYL) IVPB 500 mg     500 mg 100 mL/hr over 60 Minutes Intravenous Every 8 hours 04/07/15 2306     04/07/15 2315  cefTRIAXone (ROCEPHIN) 2 g in dextrose 5 % 50 mL IVPB     2 g 100 mL/hr over 30 Minutes Intravenous Daily at bedtime 04/07/15 2306         Assessment/Plan  POD 1, s/p lap appy for perforated appendicitis  -patient having some diarrhea, but distended with absent BS and nausea.  Suspect given extent of perforation that he will develop post op ileus -no appetite.  Will continue clear liquids, but would not advance yet -cont abx therapy D2 Rocephin/Flagyl -mobilize and pulm toilet  LOS: 1 day    Delquan Poucher E 04/09/2015, 11:29 AM Pager: [phone]

## 2015-04-10 MED ORDER — INFLUENZA VAC SPLIT QUAD 0.5 ML IM SUSY
0.5000 mL | PREFILLED_SYRINGE | INTRAMUSCULAR | Status: AC
  Administered 2015-04-11: 0.5 mL via INTRAMUSCULAR
  Filled 2015-04-10 (×2): qty 0.5

## 2015-04-10 NOTE — Progress Notes (Signed)
Patient ID: Shane Fleming, male   DOB: 12/24/1970, 44 y.o.   MRN: [id number] 2 Days Post-Op  Subjective: Says he feels much better today. Further nausea. Tolerating clear liquids. Diarrhea has stopped. Denies pain.  Objective: Vital signs in last 24 hours: Temp:  [98.1 F (36.7 C)-99.3 F (37.4 C)] 98.1 F (36.7 C) (10/08 0610) Pulse Rate:  [76-89] 89 (10/08 0610) Resp:  [16-18] 18 (10/08 0610) BP: (92-120)/(60-84) 120/84 mmHg (10/08 0610) SpO2:  [98 %-100 %] 98 % (10/08 0610) Last BM Date: 04/09/15  Intake/Output from previous day: 10/07 0701 - 10/08 0700 In: 3260 [P.O.:120; I.V.:3140] Out: 110 [Drains:110] Intake/Output this shift: Total I/O In: 425 [I.V.:425] Out: 10 [Drains:10]  General appearance: alert, cooperative and no distress GI: normal findings: soft, non-tender and nondistended Incision/Wound: clean and dry. JP drainage slightly cloudy serosanguineous  Lab Results:   Recent Labs  04/08/15 0730 04/09/15 0505  WBC 7.8 7.3  HGB 14.3 12.6*  HCT 42.1 36.5*  PLT 171 180   BMET  Recent Labs  04/08/15 0730 04/09/15 0505  NA 137 139  K 3.8 3.5  CL 105 110  CO2 23 23  GLUCOSE 120* 132*  BUN 18 17  CREATININE 1.40* 1.14  CALCIUM 8.3* 8.4*     Studies/Results: No results found.  Anti-infectives: Anti-infectives    Start     Dose/Rate Route Frequency Ordered Stop   04/07/15 2359  metroNIDAZOLE (FLAGYL) IVPB 500 mg     500 mg 100 mL/hr over 60 Minutes Intravenous Every 8 hours 04/07/15 2306     04/07/15 2315  cefTRIAXone (ROCEPHIN) 2 g in dextrose 5 % 50 mL IVPB     2 g 100 mL/hr over 30 Minutes Intravenous Daily at bedtime 04/07/15 2306        Assessment/Plan: s/p Procedure(s): APPENDECTOMY LAPAROSCOPIC Perforated appendicitis. Continue IV antibiotics today. Advance diet. Doing well.   LOS: 2 days    [name] T 04/10/2015

## 2015-04-11 MED ORDER — PANTOPRAZOLE SODIUM 40 MG PO TBEC
40.0000 mg | DELAYED_RELEASE_TABLET | Freq: Every day | ORAL | Status: DC
  Administered 2015-04-11 – 2015-04-12 (×2): 40 mg via ORAL
  Filled 2015-04-11 (×2): qty 1

## 2015-04-11 NOTE — Progress Notes (Signed)
Patient ID: Shane Fleming, male   DOB: 28-Sep-1970, 44 y.o.   MRN: [id number] 3 Days Post-Op  Subjective: Continues to feel well. Denies abdominal pain. Just sore around his umbilical incision.  Objective: Vital signs in last 24 hours: Temp:  [98.5 F (36.9 C)-98.9 F (37.2 C)] 98.5 F (36.9 C) (10/09 0435) Pulse Rate:  [72-83] 81 (10/09 0435) Resp:  [16] 16 (10/09 0435) BP: (108-127)/(71-81) 108/71 mmHg (10/09 0435) SpO2:  [98 %-100 %] 100 % (10/09 0435) Last BM Date: 04/09/15  Intake/Output from previous day: 10/08 0701 - 10/09 0700 In: 2140.8 [P.O.:420; I.V.:1570.8; IV Piggyback:150] Out: 47.5 [Drains:47.5] Intake/Output this shift:    General appearance: alert, cooperative and no distress GI: soft and minimal periumbilical incisional tenderness. Nondistended. Incision/Wound: clean and dry. JP drainage scant and serous sanguinous  Lab Results:   Recent Labs  04/09/15 0505  WBC 7.3  HGB 12.6*  HCT 36.5*  PLT 180   BMET  Recent Labs  04/09/15 0505  NA 139  K 3.5  CL 110  CO2 23  GLUCOSE 132*  BUN 17  CREATININE 1.14  CALCIUM 8.4*     Studies/Results: No results found.  Anti-infectives: Anti-infectives    Start     Dose/Rate Route Frequency Ordered Stop   04/07/15 2359  metroNIDAZOLE (FLAGYL) IVPB 500 mg     500 mg 100 mL/hr over 60 Minutes Intravenous Every 8 hours 04/07/15 2306     04/07/15 2315  cefTRIAXone (ROCEPHIN) 2 g in dextrose 5 % 50 mL IVPB     2 g 100 mL/hr over 30 Minutes Intravenous Daily at bedtime 04/07/15 2306        Assessment/Plan: s/p Procedure(s): APPENDECTOMY LAPAROSCOPIC Perforated appendicitis. Continue IV antibiotics today. Anticipate discharge tomorrow   LOS: 3 days    Shane Fleming T 04/11/2015

## 2015-04-12 MED ORDER — CIPROFLOXACIN HCL 500 MG PO TABS: 500.0000 mg | ORAL_TABLET | Freq: Two times a day (BID) | ORAL | Status: DC

## 2015-04-12 MED ORDER — METRONIDAZOLE 500 MG PO TABS: 500.0000 mg | ORAL_TABLET | Freq: Three times a day (TID) | ORAL | Status: DC

## 2015-04-12 MED ORDER — OXYCODONE-ACETAMINOPHEN 5-325 MG PO TABS: 1.0000 | ORAL_TABLET | ORAL | Status: DC | PRN

## 2015-04-12 NOTE — Discharge Summary (Signed)
Patient ID: Shane Fleming MRN: [id number] DOB/AGE: [birth year] 44 y.o.  Admit date: 04/07/2015 Discharge date: 04/12/2015  Procedures: lap appy for perforated appendicitis  Consults: None  Reason for Admission:The pt is a 44yo bm who presents with abdominal pain that started Monday night. He had one episode of vomiting yesterday. He says he had a fever at home. CT shows appendicitis but no abscess or perforation.   Admission Diagnoses:  1. Acute appendicitis, possible perforation  Hospital Course: The patient was admitted and started on IV Rocephin and Flagyl.  The following day he was taken to the OR where he underwent the above procedure.  He tolerated this well, but was noted to have perforated appendicitis.  A JP drain was placed.  On POD 1, he was taking few clear liquids and having a bit of an ileus.  He was passing diarrhea though and somewhat incontinent.  He was left on clear liquids.  The following day, he was feeling much better.  His diet was able to be advanced as tolerated and his abdominal distention continued to improve.  On POD 4, he was tolerating a regular diet and pain was controlled with oral pain meds.  He was stable for dc home at that time and his JP drain was removed prior to discharge.  PE: Abd: soft, appropriately tender, mild bloating, +BS, JP with minimal serous drainage, incisions c/d/i  Discharge Diagnoses:  Active Problems:   Appendicitis   Acute perforated appendicitis s/p lap appy  Discharge Medications:   Medication List    TAKE these medications        ciprofloxacin 500 MG tablet  Commonly known as:  CIPRO  Take 1 tablet (500 mg total) by mouth 2 (two) times daily.     metroNIDAZOLE 500 MG tablet  Commonly known as:  FLAGYL  Take 1 tablet (500 mg total) by mouth 3 (three) times daily.     oxyCODONE-acetaminophen 5-325 MG tablet  Commonly known as:  PERCOCET/ROXICET  Take 1-2 tablets by mouth every 4 (four) hours as needed for moderate pain.         Discharge Instructions:     Follow-up Information    Follow up with LIEPINS, ANDY, PA-C On 04/21/2015.   Specialty:  Surgery   Why:  [facility], Your appointment is at 10:30 AM, be at the office at 10 AM for check in.   Contact information:   [address] [place] [address] [phone]       Signed: Letha Cape 04/12/2015, 12:58 PM

## 2015-04-12 NOTE — Discharge Instructions (Signed)
[facility] ______[facility] °LAPAROSCOPIC SURGERY: POST OP INSTRUCTIONS °Always review your discharge instruction sheet given to you by the facility where your surgery was performed. °IF YOU HAVE DISABILITY OR FAMILY LEAVE FORMS, YOU MUST BRING THEM TO THE OFFICE FOR PROCESSING.   °DO NOT GIVE THEM TO YOUR DOCTOR. ° °1. A prescription for pain medication may be given to you upon discharge.  Take your pain medication as prescribed, if needed.  If narcotic pain medicine is not needed, then you may take acetaminophen (Tylenol) or ibuprofen (Advil) as needed. °2. Take your usually prescribed medications unless otherwise directed. °3. If you need a refill on your pain medication, please contact your pharmacy.  They will contact our office to request authorization. Prescriptions will not be filled after 5pm or on week-ends. °4. You should follow a light diet the first few days after arrival home, such as soup and crackers, etc.  Be sure to include lots of fluids daily. °5. Most patients will experience some swelling and bruising in the area of the incisions.  Ice packs will help.  Swelling and bruising can take several days to resolve.  °6. It is common to experience some constipation if taking pain medication after surgery.  Increasing fluid intake and taking a stool softener (such as Colace) will usually help or prevent this problem from occurring.  A mild laxative (Milk of Magnesia or Miralax) should be taken according to package instructions if there are no bowel movements after 48 hours. °7. Unless discharge instructions indicate otherwise, you may remove your bandages 24-48 hours after surgery, and you may shower at that time.  You may have steri-strips (small skin tapes) in place directly over the incision.  These strips should be left on the skin for 7-10 days.  If your surgeon used skin glue on the incision, you may shower in 24 hours.  The glue will flake off over the next 2-3 weeks.  Any sutures or  staples will be removed at the office during your follow-up visit. °8. ACTIVITIES:  You may resume regular (light) daily activities beginning the next day--such as daily self-care, walking, climbing stairs--gradually increasing activities as tolerated.  You may have sexual intercourse when it is comfortable.  Refrain from any heavy lifting or straining until approved by your doctor. °a. You may drive when you are no longer taking prescription pain medication, you can comfortably wear a seatbelt, and you can safely maneuver your car and apply brakes. °b. RETURN TO WORK:  __________________________________________________________ °9. You should see your doctor in the office for a follow-up appointment approximately 2-3 weeks after your surgery.  Make sure that you call for this appointment within a day or two after you arrive home to insure a convenient appointment time. °10. OTHER INSTRUCTIONS: __________________________________________________________________________________________________________________________ __________________________________________________________________________________________________________________________ °WHEN TO CALL YOUR DOCTOR: °1. Fever over 101.0 °2. Inability to urinate °3. Continued bleeding from incision. °4. Increased pain, redness, or drainage from the incision. °5. Increasing abdominal pain ° °The clinic staff is available to answer your questions during regular business hours.  Please don’t hesitate to call and ask to speak to one of the nurses for clinical concerns.  If you have a medical emergency, go to the nearest emergency room or call 911.  A surgeon from [facility] is always on call at the hospital. °[address], [address], [place], [place]  [address] ? [address] [address], [place], [place]   [address] °[phone] ? [phone] ? FAX [phone] °Web site:   [email or web address] °

## 2016-07-03 DIAGNOSIS — J329 Chronic sinusitis, unspecified: Secondary | ICD-10-CM

## 2016-07-03 HISTORY — DX: Chronic sinusitis, unspecified: J32.9

## 2017-05-10 ENCOUNTER — Telehealth: Payer: Self-pay

## 2017-05-10 NOTE — Telephone Encounter (Signed)
Called pt. At both number on file and left VM regarding a sooner appt. For 05/11/17. Stated on the VM to call back to confirm appt.

## 2017-05-11 ENCOUNTER — Encounter: Payer: Self-pay | Admitting: Nurse Practitioner

## 2017-05-11 ENCOUNTER — Ambulatory Visit: Payer: Self-pay | Attending: Nurse Practitioner | Admitting: Nurse Practitioner

## 2017-05-11 VITALS — BP 113/71 | HR 103 | Temp 97.8°F | Resp 18 | Ht 71.0 in | Wt 153.2 lb

## 2017-05-11 DIAGNOSIS — Z79891 Long term (current) use of opiate analgesic: Secondary | ICD-10-CM | POA: Insufficient documentation

## 2017-05-11 DIAGNOSIS — R05 Cough: Secondary | ICD-10-CM | POA: Insufficient documentation

## 2017-05-11 DIAGNOSIS — Z23 Encounter for immunization: Secondary | ICD-10-CM

## 2017-05-11 DIAGNOSIS — Z79899 Other long term (current) drug therapy: Secondary | ICD-10-CM | POA: Insufficient documentation

## 2017-05-11 DIAGNOSIS — R059 Cough, unspecified: Secondary | ICD-10-CM

## 2017-05-11 DIAGNOSIS — Z9889 Other specified postprocedural states: Secondary | ICD-10-CM | POA: Insufficient documentation

## 2017-05-11 MED ORDER — LORATADINE 10 MG PO TABS
10.0000 mg | ORAL_TABLET | Freq: Every day | ORAL | 11 refills | Status: DC
Start: 2017-05-11 — End: 2017-06-05

## 2017-05-11 MED ORDER — FLUTICASONE PROPIONATE 50 MCG/ACT NA SUSP: 1.0000 | Freq: Every day | NASAL | 6 refills | Status: DC

## 2017-05-11 MED FILL — FLUTICASONE PROP 50 MCG SPR: 50 | 30 days supply | Qty: 16 | Fill #0

## 2017-05-11 NOTE — Progress Notes (Signed)
Assessment & Plan:  Shane Fleming was seen today for follow-up.  Diagnoses and all orders for this visit:  Cough in adult patient -     fluticasone (FLONASE) 50 MCG/ACT nasal spray; Place 1 spray daily into both nostrils. -     loratadine (CLARITIN) 10 MG tablet; Take 1 tablet (10 mg total) daily by mouth. -     QuantiFERON-TB Gold Plus -     Basic Metabolic Panel -     CBC -     Lipid panel -     HIV antibody (with reflex)    Subjective:   Chief Complaint  Patient presents with  . Follow-up    Patient stated that he has been coughing for the past 2 months and would like to know if there is anything that can help.   HPI Shane Fleming 46 y.o. male presents to office today with persistent cough.   Cough Patient complains of persistant cough. .  Symptoms began 2 months ago.  The cough is without wheezing, dyspnea or hemoptysis, productive of green/yellow sputum, worsening over time and is aggravated by  hot showers.  Associated symptoms include:fever. Patient does not have new pets. Patient does not have a history of asthma. Patient does not have a history of environmental allergens. Patient has not had recent travel. Patient does not have a history of smoking. Patient  has not had a previous Chest X-ray since 2006. Patient has not had a PPD done. He has been OTC cough med and drinking plenty of liquids. He lives alone in an apartment and has not had any contact with sick individuals. 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  He does endorse weight loss due to poor appetite over the past few months. He denies any other PMH.   History reviewed. No pertinent past medical history.  Past Surgical History:  Procedure Laterality Date  . APPENDECTOMY      History reviewed. No pertinent family history.  Social History   Socioeconomic History  . Marital status: Single    Spouse name: Not on file  . Number of children: Not on file   . Years of education: Not on file  . Highest education level: Not on file  Social Needs  . Financial resource strain: Not on file  . Food insecurity - worry: Not on file  . Food insecurity - inability: Not on file  . Transportation needs - medical: Not on file  . Transportation needs - non-medical: Not on file  Occupational History  . Not on file  Tobacco Use  . Smoking status: Never Smoker  . Smokeless tobacco: Never Used  Substance and Sexual Activity  . Alcohol use: No  . Drug use: No  . Sexual activity: Not Currently  Other Topics Concern  . Not on file  Social History Narrative  . Not on file    Outpatient Medications Prior to Visit  Medication Sig Dispense Refill  . ciprofloxacin (CIPRO) 500 MG tablet Take 1 tablet (500 mg total) by mouth 2 (two) times daily. (Patient not taking: Reported on 05/11/2017) 14 tablet 0  . metroNIDAZOLE (FLAGYL) 500 MG tablet Take 1 tablet (500 mg total) by mouth 3 (three) times daily. (Patient not taking: Reported on 05/11/2017) 21 tablet 0  . oxyCODONE-acetaminophen (PERCOCET/ROXICET) 5-325 MG tablet Take 1-2 tablets by mouth every 4 (four) hours as needed for moderate pain. (Patient not taking: Reported on 05/11/2017) 40 tablet 0  No facility-administered medications prior to visit.     No Known Allergies  Review of Systems  Constitutional: Positive for chills, fever and weight loss. Negative for diaphoresis.  HENT: Positive for congestion (nasal).   Eyes: Negative.  Negative for blurred vision and double vision.  Respiratory: Positive for cough and sputum production. Negative for hemoptysis and wheezing.   Cardiovascular: Negative.  Negative for chest pain, palpitations, leg swelling and PND.  Gastrointestinal: Negative.  Negative for abdominal pain, blood in stool, constipation, diarrhea, heartburn, melena, nausea and vomiting.  Musculoskeletal: Negative.  Negative for myalgias.  Skin: Negative.   Neurological: Negative.  Negative for  dizziness, sensory change, focal weakness and headaches.  Endo/Heme/Allergies: Positive for environmental allergies.  Psychiatric/Behavioral: Positive for depression. Negative for hallucinations, memory loss, substance abuse and suicidal ideas. The patient has insomnia. The patient is not nervous/anxious.        Objective:    Physical Exam  Constitutional: He is oriented to person, place, and time. He appears well-developed and well-nourished. He is cooperative.  HENT:  Head: Normocephalic and atraumatic.  Eyes: EOM are normal.  Neck: Normal range of motion.  Cardiovascular: Regular rhythm, normal heart sounds and intact distal pulses. Tachycardia present. Exam reveals no gallop and no friction rub.  No murmur heard. Pulmonary/Chest: Effort normal and breath sounds normal. No accessory muscle usage. Tachypnea noted. No respiratory distress. He has no decreased breath sounds. He has no wheezes. He has no rhonchi. He has no rales. He exhibits no tenderness.  Abdominal: Soft. Bowel sounds are normal.  Musculoskeletal: Normal range of motion. He exhibits no edema.  Neurological: He is alert and oriented to person, place, and time. Coordination normal.  Skin: Skin is warm and dry.  Psychiatric: He has a normal mood and affect. His behavior is normal. Judgment and thought content normal. Thought content is not paranoid and not delusional. He expresses no homicidal and no suicidal ideation. He expresses no suicidal plans and no homicidal plans.  Nursing note and vitals reviewed.   BP 113/71 (BP Location: Left Arm, Patient Position: Sitting, Cuff Size: Normal)   Pulse (!) 103   Temp 97.8 F (36.6 C) (Oral)   Resp 18   Ht 5\' 11"  (1.803 m)   Wt 153 lb 3.2 oz (69.5 kg)   SpO2 97%   BMI 21.37 kg/m  Wt Readings from Last 3 Encounters:  05/11/17 153 lb 3.2 oz (69.5 kg)  04/07/15 169 lb 16 oz (77.1 kg)         Patient has been counseled extensively about nutrition and exercise as well as  the importance of adherence with medications and regular follow-up. The patient was given clear instructions to go to ER or return to medical center if symptoms don't improve, worsen or new problems develop. The patient verbalized understanding.   Follow-up: Return in about 2 weeks (around 05/25/2017) for f/u cough and flu vaccine .   Claiborne RiggZelda W Naomi Castrogiovanni, FNP-BC [facility] [place], [place] [phone]   05/11/2017, 10:57 AM

## 2017-05-11 NOTE — Patient Instructions (Addendum)

## 2017-05-14 NOTE — Telephone Encounter (Signed)
Patient is informed about his lab result and understood it.   Patient verified DOB.

## 2017-05-14 NOTE — Telephone Encounter (Signed)
-----   Message from Claiborne RiggZelda W Fleming, NP sent at 05/14/2017  1:46 PM EST ----- HIV is negative. Still awaiting a few additional labs. Your lipid panel is normal and your CBC does not show anemia. Electrolytes are normal as well as kidney function

## 2017-05-15 LAB — CBC
Hematocrit: 43.7 % (ref 37.5–51.0)
Hemoglobin: 15.1 g/dL (ref 13.0–17.7)
MCH: 28.7 pg (ref 26.6–33.0)
MCHC: 34.6 g/dL (ref 31.5–35.7)
MCV: 83 fL (ref 79–97)
Platelets: 322 10*3/uL (ref 150–379)
RBC: 5.27 x10E6/uL (ref 4.14–5.80)
RDW: 14.7 % (ref 12.3–15.4)
WBC: 6.9 10*3/uL (ref 3.4–10.8)

## 2017-05-15 LAB — LIPID PANEL
CHOL/HDL RATIO: 3.3 ratio (ref 0.0–5.0)
Cholesterol, Total: 142 mg/dL (ref 100–199)
HDL: 43 mg/dL (ref >39)
LDL CALC: 85 mg/dL (ref 0–99)
TRIGLYCERIDES: 68 mg/dL (ref 0–149)
VLDL CHOLESTEROL CAL: 14 mg/dL (ref 5–40)

## 2017-05-15 LAB — BASIC METABOLIC PANEL
BUN / CREAT RATIO: 16 (ref 9–20)
BUN: 20 mg/dL (ref 6–24)
CHLORIDE: 100 mmol/L (ref 96–106)
CO2: 25 mmol/L (ref 20–29)
Calcium: 9.9 mg/dL (ref 8.7–10.2)
Creatinine, Ser: 1.27 mg/dL (ref 0.76–1.27)
GFR calc Af Amer: 77 mL/min/{1.73_m2} (ref >59)
GFR calc non Af Amer: 66 mL/min/{1.73_m2} (ref >59)
Glucose: 106 mg/dL — ABNORMAL HIGH (ref 65–99)
POTASSIUM: 3.5 mmol/L (ref 3.5–5.2)
Sodium: 142 mmol/L (ref 134–144)

## 2017-05-15 LAB — QUANTIFERON-TB GOLD PLUS
QuantiFERON Mitogen Value: 0.37 IU/mL
QuantiFERON Nil Value: 0.04 IU/mL
QuantiFERON TB1 Ag Value: 0.1 IU/mL
QuantiFERON TB2 Ag Value: 0.09 IU/mL
QuantiFERON-TB Gold Plus: UNDETERMINED — AB

## 2017-05-15 LAB — HIV ANTIBODY (ROUTINE TESTING W REFLEX): HIV SCREEN 4TH GENERATION: NONREACTIVE

## 2017-05-16 ENCOUNTER — Inpatient Hospital Stay: Payer: Self-pay

## 2017-05-17 ENCOUNTER — Telehealth: Payer: Self-pay

## 2017-05-17 NOTE — Telephone Encounter (Signed)
-----   Message from Claiborne RiggZelda W Fleming, NP sent at 05/17/2017 12:04 AM EST ----- Lab studies do not show TB at this time. Will discuss in 2 weeks additional testing that may need to be performed.

## 2017-05-17 NOTE — Telephone Encounter (Signed)
Patient informed on lab result. Patient is aware to come back in two weeks to do additional testing.   Patient verified DOB.

## 2017-06-05 ENCOUNTER — Encounter: Payer: Self-pay | Admitting: Nurse Practitioner

## 2017-06-05 ENCOUNTER — Ambulatory Visit: Payer: Self-pay | Attending: Nurse Practitioner | Admitting: Nurse Practitioner

## 2017-06-05 VITALS — BP 108/68 | HR 102 | Temp 98.1°F | Resp 14 | Ht 71.0 in | Wt 160.4 lb

## 2017-06-05 DIAGNOSIS — Z7951 Long term (current) use of inhaled steroids: Secondary | ICD-10-CM | POA: Insufficient documentation

## 2017-06-05 DIAGNOSIS — R05 Cough: Secondary | ICD-10-CM | POA: Insufficient documentation

## 2017-06-05 DIAGNOSIS — R053 Chronic cough: Secondary | ICD-10-CM

## 2017-06-05 DIAGNOSIS — Z23 Encounter for immunization: Secondary | ICD-10-CM

## 2017-06-05 MED ORDER — LORATADINE 10 MG PO TABS: 10.0000 mg | ORAL_TABLET | Freq: Every day | ORAL | 11 refills | Status: DC

## 2017-06-05 MED ORDER — FLUTICASONE PROPIONATE 50 MCG/ACT NA SUSP: 1.0000 | Freq: Every day | NASAL | 6 refills | Status: DC

## 2017-06-05 MED FILL — FLUTICASONE PROP 50 MCG SPR: 50 | 60 days supply | Qty: 16 | Fill #0

## 2017-06-05 NOTE — Progress Notes (Signed)
Assessment & Plan:  Shane Fleming was seen today for follow-up.  Diagnoses and all orders for this visit:  Chronic coughing -     loratadine (CLARITIN) 10 MG tablet; Take 1 tablet (10 mg total) by mouth daily. -     DG Chest 2 View; Future -     fluticasone (FLONASE) 50 MCG/ACT nasal spray; Place 1 spray into both nostrils daily.  Immunization due -     Flu Vaccine QUAD 6+ mos PF IM (Fluarix Quad PF)    Patient has been counseled on age-appropriate routine health concerns for screening and prevention. These are reviewed and up-to-date. Referrals have been placed accordingly. Immunizations are up-to-date or declined.    Subjective:   Chief Complaint  Patient presents with  . Follow-up    Patient is here for a follow-up. Patient stated that his cough has not improve.   HPI Shane Fleming 46 y.o. male presents to office today for follow up to cough.     COUGH Unfortunately he reports he was not given his claritin at his last office visit so he has only been using flonase. He was prescribed claritin to ascertain if his cough was related to allergies. He will start claritin today. Cough is still persistent. Productive of yellow and brown phlegm. Symptoms began 3-4 months ago. Cough described as without wheezing, dyspnea or hemoptysis. Patient denies chills, dyspnea, fever and nonproductive cough. Associated symptoms include nasal congestion.  Patient does not have a history of bronchiolitis, chronic lung disease, croup, pneumonia and wheezing. Current treatments have included flonase, with poor improvement.  Patient denies have tobacco smoke exposure. Will order cxray today as cough is productive and persistent. He may need prn inhaler.       Review of Systems  Constitutional: Positive for malaise/fatigue. Negative for chills, diaphoresis, fever and weight loss.  HENT: Positive for congestion. Negative for ear pain, nosebleeds, sinus pain and sore throat.   Eyes: Negative.     Respiratory: Positive for cough, sputum production and shortness of breath. Negative for hemoptysis and wheezing.   Cardiovascular: Negative.  Negative for chest pain, orthopnea and PND.  Gastrointestinal: Negative for abdominal pain, constipation, diarrhea, heartburn, nausea and vomiting.  Musculoskeletal: Negative.   Skin: Negative.   Neurological: Negative.  Negative for dizziness, weakness and headaches.  Endo/Heme/Allergies: Negative.   Psychiatric/Behavioral: Negative.     History reviewed. No pertinent past medical history.  Past Surgical History:  Procedure Laterality Date  . APPENDECTOMY    . LAPAROSCOPIC APPENDECTOMY N/A 04/08/2015   Procedure: APPENDECTOMY LAPAROSCOPIC;  Surgeon: Glenna FellowsBenjamin Hoxworth, MD;  Location: [facility];  Service: General;  Laterality: N/A;    History reviewed. No pertinent family history.  Social History Reviewed with no changes to be made today.   Outpatient Medications Prior to Visit  Medication Sig Dispense Refill  . fluticasone (FLONASE) 50 MCG/ACT nasal spray Place 1 spray daily into both nostrils. 16 g 6  . loratadine (CLARITIN) 10 MG tablet Take 1 tablet (10 mg total) daily by mouth. (Patient not taking: Reported on 06/05/2017) 30 tablet 11   No facility-administered medications prior to visit.     No Known Allergies     Objective:    BP 108/68 (BP Location: Left Arm, Patient Position: Sitting, Cuff Size: Normal)   Pulse (!) 102   Temp 98.1 F (36.7 C) (Oral)   Resp 14   Ht 5\' 11"  (1.803 m)   Wt 160 lb 6.4 oz (72.8 kg)   SpO2 96%  BMI 22.37 kg/m  Wt Readings from Last 3 Encounters:  06/05/17 160 lb 6.4 oz (72.8 kg)  05/11/17 153 lb 3.2 oz (69.5 kg)  04/07/15 169 lb 16 oz (77.1 kg)    Physical Exam  Constitutional: He is oriented to person, place, and time. He appears well-developed and well-nourished. He is cooperative.  HENT:  Head: Normocephalic and atraumatic.  Eyes: EOM are normal.  Neck: Normal range of motion.   Cardiovascular: Regular rhythm, normal heart sounds and intact distal pulses. Tachycardia present. Exam reveals no gallop and no friction rub.  No murmur heard. Pulmonary/Chest: Effort normal. No tachypnea. No respiratory distress. He has no decreased breath sounds. He has no wheezes. He has no rhonchi. He has no rales. He exhibits no tenderness.  Abdominal: Soft. Bowel sounds are normal.  Musculoskeletal: Normal range of motion. He exhibits no edema.  Neurological: He is alert and oriented to person, place, and time. Coordination normal.  Skin: Skin is warm and dry.  Psychiatric: He has a normal mood and affect. His behavior is normal. Judgment and thought content normal.  Nursing note and vitals reviewed.        Patient has been counseled extensively about nutrition and exercise as well as the importance of adherence with medications and regular follow-up. The patient was given clear instructions to go to ER or return to medical center if symptoms don't improve, worsen or new problems develop. The patient verbalized understanding.   Follow-up: Return in about 4 weeks (around 07/03/2017) for f/u cough.   Claiborne RiggZelda W Darrick Greenlaw, FNP-BC [facility] [place], [place] [phone]   06/05/2017, 3:09 PM

## 2017-06-05 NOTE — Progress Notes (Signed)
flu

## 2017-06-06 ENCOUNTER — Other Ambulatory Visit: Payer: Self-pay | Admitting: Nurse Practitioner

## 2017-06-06 ENCOUNTER — Ambulatory Visit (HOSPITAL_COMMUNITY)
Admission: RE | Admit: 2017-06-06 | Discharge: 2017-06-06 | Disposition: A | Discharge status: Home / Self Care | Payer: Self-pay | Source: Ambulatory Visit | Attending: Nurse Practitioner | Admitting: Nurse Practitioner

## 2017-06-06 DIAGNOSIS — R053 Chronic cough: Secondary | ICD-10-CM

## 2017-06-06 DIAGNOSIS — R05 Cough: Secondary | ICD-10-CM | POA: Insufficient documentation

## 2017-06-06 DIAGNOSIS — J181 Lobar pneumonia, unspecified organism: Secondary | ICD-10-CM

## 2017-06-06 DIAGNOSIS — R918 Other nonspecific abnormal finding of lung field: Secondary | ICD-10-CM | POA: Insufficient documentation

## 2017-06-06 DIAGNOSIS — J189 Pneumonia, unspecified organism: Secondary | ICD-10-CM

## 2017-06-06 MED ORDER — DOXYCYCLINE HYCLATE 100 MG PO TABS: 100.0000 mg | ORAL_TABLET | Freq: Two times a day (BID) | ORAL | 0 refills | Status: AC

## 2017-06-07 ENCOUNTER — Telehealth: Payer: Self-pay

## 2017-06-07 MED FILL — ?DOXYCYCLINE 100MG TABLET: 100 | 7 days supply | Qty: 14 | Fill #0

## 2017-06-07 NOTE — Telephone Encounter (Signed)
Patient informed on x-ray result. Patient aware to pick up Rx from the pharmacy. Will schedule a CT scan for patient.    Patient verified DOB.

## 2017-06-07 NOTE — Telephone Encounter (Signed)
-----   Message from Claiborne RiggZelda W Fleming, NP sent at 06/06/2017 11:39 PM EST ----- Please let patient know his cxray shows bilateral PNA. I have ordered a CT scan for additional testing. His antibiotics have been sent to the [facility] pharmacy.

## 2017-06-13 ENCOUNTER — Encounter (HOSPITAL_COMMUNITY): Payer: Self-pay

## 2017-06-13 ENCOUNTER — Ambulatory Visit (HOSPITAL_COMMUNITY)
Admission: RE | Admit: 2017-06-13 | Discharge: 2017-06-13 | Disposition: A | Discharge status: Home / Self Care | Payer: Self-pay | Source: Ambulatory Visit | Attending: Nurse Practitioner | Admitting: Nurse Practitioner

## 2017-06-13 DIAGNOSIS — J189 Pneumonia, unspecified organism: Secondary | ICD-10-CM

## 2017-06-13 DIAGNOSIS — J181 Lobar pneumonia, unspecified organism: Secondary | ICD-10-CM | POA: Insufficient documentation

## 2017-06-13 DIAGNOSIS — R918 Other nonspecific abnormal finding of lung field: Secondary | ICD-10-CM | POA: Insufficient documentation

## 2017-07-04 ENCOUNTER — Ambulatory Visit: Payer: Self-pay | Attending: Nurse Practitioner | Admitting: Nurse Practitioner

## 2017-07-04 ENCOUNTER — Encounter: Payer: Self-pay | Admitting: Nurse Practitioner

## 2017-07-04 VITALS — BP 103/67 | HR 97 | Temp 98.1°F | Resp 16 | Ht 71.0 in | Wt 158.0 lb

## 2017-07-04 DIAGNOSIS — R053 Chronic cough: Secondary | ICD-10-CM

## 2017-07-04 DIAGNOSIS — R05 Cough: Secondary | ICD-10-CM | POA: Insufficient documentation

## 2017-07-04 DIAGNOSIS — Z9889 Other specified postprocedural states: Secondary | ICD-10-CM | POA: Insufficient documentation

## 2017-07-04 DIAGNOSIS — R918 Other nonspecific abnormal finding of lung field: Secondary | ICD-10-CM | POA: Insufficient documentation

## 2017-07-04 DIAGNOSIS — Z79899 Other long term (current) drug therapy: Secondary | ICD-10-CM | POA: Insufficient documentation

## 2017-07-04 NOTE — Patient Instructions (Signed)
Cough, Adult  Coughing is a reflex that clears your throat and your airways. Coughing helps to heal and protect your lungs. It is normal to cough occasionally, but a cough that happens with other symptoms or lasts a long time may be a sign of a condition that needs treatment. A cough may last only 2-3 weeks (acute), or it may last longer than 8 weeks (chronic).  What are the causes?  Coughing is commonly caused by:   Breathing in substances that irritate your lungs.   A viral or bacterial respiratory infection.   Allergies.   Asthma.   Postnasal drip.   Smoking.   Acid backing up from the stomach into the esophagus (gastroesophageal reflux).   Certain medicines.   Chronic lung problems, including COPD (or rarely, lung cancer).   Other medical conditions such as heart failure.    Follow these instructions at home:  Pay attention to any changes in your symptoms. Take these actions to help with your discomfort:   Take medicines only as told by your health care provider.  ? If you were prescribed an antibiotic medicine, take it as told by your health care provider. Do not stop taking the antibiotic even if you start to feel better.  ? Talk with your health care provider before you take a cough suppressant medicine.   Drink enough fluid to keep your urine clear or pale yellow.   If the air is dry, use a cold steam vaporizer or humidifier in your bedroom or your home to help loosen secretions.   Avoid anything that causes you to cough at work or at home.   If your cough is worse at night, try sleeping in a semi-upright position.   Avoid cigarette smoke. If you smoke, quit smoking. If you need help quitting, ask your health care provider.   Avoid caffeine.   Avoid alcohol.   Rest as needed.    Contact a health care provider if:   You have new symptoms.   You cough up pus.   Your cough does not get better after 2-3 weeks, or your cough gets worse.   You cannot control your cough with suppressant  medicines and you are losing sleep.   You develop pain that is getting worse or pain that is not controlled with pain medicines.   You have a fever.   You have unexplained weight loss.   You have night sweats.  Get help right away if:   You cough up blood.   You have difficulty breathing.   Your heartbeat is very fast.  This information is not intended to replace advice given to you by your health care provider. Make sure you discuss any questions you have with your health care provider.  Document Released: 12/16/2010 Document Revised: 11/25/2015 Document Reviewed: 08/26/2014  Elsevier Interactive Patient Education  2018 [employer]

## 2017-07-04 NOTE — Progress Notes (Signed)
Assessment & Plan:  Shane Fleming was seen today for follow-up.  Diagnoses and all orders for this visit:  Chronic cough -     Ambulatory referral to Pulmonology Continue claritin as prescribed.   Patient has been counseled on age-appropriate routine health concerns for screening and prevention. These are reviewed and up-to-date. Referrals have been placed accordingly. Immunizations are up-to-date or declined.    Subjective:   Chief Complaint  Patient presents with  . Follow-up    Patient is here for a follow-up on cough. Patient stated his cough is getting better and his chest dosen't hurt when he coughs.   HPI Shane Fleming 47 y.o. male presents to office today for follow up to chronic cough. He was recently treated for B/L PNA with antibiotics last month 06-06-2017. Today he reports decreased episodes of coughing however there is still a productive cough that is present at times. Due to recent CT results and persistent productive cough and fatigue, will refer to pulmonology.   CT Chest Wo Contrast COMPARISON:  11/08/2010 Lungs/Pleura: The lungs are well aerated bilaterally. Some patchy infiltrative changes are again noted throughout both lungs with areas of mucous plugging. Some small nodules are identified, the largest of these is noted in the right upper lobe best seen on image number 69 of series 4. The infiltrative changes are related in part to some mucous plugging. The nodular changes are likely postinflammatory in nature. No sizable effusion or pneumothorax is noted. A few scattered calcifications are noted likely related to prior granulomatous disease.  IMPRESSION: 06-14-2017 post antibiotic treatment.  Patchy bilateral infiltrates are identified without significant consolidation. Some associated nodularity likely postinflammatory in nature is seen. The largest of these measures approximately 6 mm. Non-contrast chest CT at 3-6 months is recommended. If the nodules are  stable at time of repeat CT, then future CT at 18-24 months (from today's scan) is considered optional for low-risk patients, but is recommended for high-risk patients.    Review of Systems  Constitutional: Positive for malaise/fatigue.  HENT: Negative.   Respiratory: Positive for cough, sputum production and wheezing. Negative for hemoptysis and shortness of breath.   Cardiovascular: Negative.  Negative for chest pain, orthopnea and leg swelling.  Gastrointestinal: Negative.  Negative for abdominal pain, heartburn, nausea and vomiting.  Genitourinary: Negative.   Musculoskeletal: Negative for back pain.  Neurological: Negative.  Negative for dizziness, tingling, focal weakness and headaches.  Psychiatric/Behavioral: Negative.     History reviewed. No pertinent past medical history.  Past Surgical History:  Procedure Laterality Date  . APPENDECTOMY    . LAPAROSCOPIC APPENDECTOMY N/A 04/08/2015   Procedure: APPENDECTOMY LAPAROSCOPIC;  Surgeon: Glenna Fellows, MD;  Location: [facility];  Service: General;  Laterality: N/A;    History reviewed. No pertinent family history.  Social History Reviewed with no changes to be made today.   Outpatient Medications Prior to Visit  Medication Sig Dispense Refill  . fluticasone (FLONASE) 50 MCG/ACT nasal spray Place 1 spray into both nostrils daily. 16 g 6  . loratadine (CLARITIN) 10 MG tablet Take 1 tablet (10 mg total) by mouth daily. 30 tablet 11   No facility-administered medications prior to visit.     No Known Allergies     Objective:    BP 103/67 (BP Location: Left Arm, Patient Position: Sitting, Cuff Size: Normal)   Pulse 97   Temp 98.1 F (36.7 C) (Oral)   Resp 16   Ht 5\' 11"  (1.803 m)   Wt 158 lb (71.7  kg)   SpO2 96%   BMI 22.04 kg/m  Wt Readings from Last 3 Encounters:  07/04/17 158 lb (71.7 kg)  06/05/17 160 lb 6.4 oz (72.8 kg)  05/11/17 153 lb 3.2 oz (69.5 kg)    Physical Exam  Constitutional: He is oriented to  person, place, and time. He appears well-developed and well-nourished. He is cooperative.  HENT:  Head: Normocephalic and atraumatic.  Eyes: EOM are normal.  Neck: Normal range of motion.  Cardiovascular: Normal rate, regular rhythm, normal heart sounds and intact distal pulses. Exam reveals no gallop and no friction rub.  No murmur heard. Pulmonary/Chest: Effort normal. No accessory muscle usage. Tachypnea noted. No respiratory distress. He has decreased breath sounds in the right upper field. He has wheezes in the right middle field and the right lower field. He has rhonchi in the right middle field and the right lower field. He has no rales. He exhibits no tenderness.  Abdominal: Soft. Bowel sounds are normal.  Musculoskeletal: Normal range of motion. He exhibits no edema.  Neurological: He is alert and oriented to person, place, and time. Coordination normal.  Skin: Skin is warm and dry.  Psychiatric: He has a normal mood and affect. His behavior is normal. Judgment and thought content normal.  Nursing note and vitals reviewed.     Patient has been counseled extensively about nutrition and exercise as well as the importance of adherence with medications and regular follow-up. The patient was given clear instructions to go to ER or return to medical center if symptoms don't improve, worsen or new problems develop. The patient verbalized understanding.   Follow-up: Return for Needs appointment with financial representative.as soon as possible. Claiborne Rigg.   Zelda W Fleming, FNP-BC [facility] [place], [place] [phone]   07/04/2017, 12:01 PM

## 2017-07-24 ENCOUNTER — Other Ambulatory Visit (INDEPENDENT_AMBULATORY_CARE_PROVIDER_SITE_OTHER): Payer: Self-pay

## 2017-07-24 ENCOUNTER — Ambulatory Visit (INDEPENDENT_AMBULATORY_CARE_PROVIDER_SITE_OTHER): Payer: Self-pay | Admitting: Internal Medicine

## 2017-07-24 ENCOUNTER — Encounter: Payer: Self-pay | Admitting: Internal Medicine

## 2017-07-24 VITALS — BP 120/80 | HR 109 | Ht 71.0 in | Wt 156.8 lb

## 2017-07-24 DIAGNOSIS — R918 Other nonspecific abnormal finding of lung field: Secondary | ICD-10-CM | POA: Insufficient documentation

## 2017-07-24 DIAGNOSIS — R05 Cough: Secondary | ICD-10-CM

## 2017-07-24 DIAGNOSIS — R053 Chronic cough: Secondary | ICD-10-CM | POA: Insufficient documentation

## 2017-07-24 DIAGNOSIS — R059 Cough, unspecified: Secondary | ICD-10-CM

## 2017-07-24 LAB — CBC WITH DIFFERENTIAL/PLATELET
BASOS PCT: 0.6 % (ref 0.0–3.0)
Basophils Absolute: 0 10*3/uL (ref 0.0–0.1)
Eosinophils Absolute: 0.4 10*3/uL (ref 0.0–0.7)
Eosinophils Relative: 5.4 % — ABNORMAL HIGH (ref 0.0–5.0)
HEMATOCRIT: 43.8 % (ref 39.0–52.0)
Hemoglobin: 14.7 g/dL (ref 13.0–17.0)
LYMPHS PCT: 44.6 % (ref 12.0–46.0)
Lymphs Abs: 2.9 10*3/uL (ref 0.7–4.0)
MCHC: 33.7 g/dL (ref 30.0–36.0)
MCV: 85.6 fl (ref 78.0–100.0)
MONOS PCT: 9.3 % (ref 3.0–12.0)
Monocytes Absolute: 0.6 10*3/uL (ref 0.1–1.0)
NEUTROS ABS: 2.6 10*3/uL (ref 1.4–7.7)
Neutrophils Relative %: 40.1 % — ABNORMAL LOW (ref 43.0–77.0)
PLATELETS: 355 10*3/uL (ref 150.0–400.0)
RBC: 5.11 Mil/uL (ref 4.22–5.81)
RDW: 14.8 % (ref 11.5–15.5)
WBC: 6.6 10*3/uL (ref 4.0–10.5)

## 2017-07-24 LAB — BASIC METABOLIC PANEL
BUN: 14 mg/dL (ref 6–23)
CO2: 30 meq/L (ref 19–32)
Calcium: 9.5 mg/dL (ref 8.4–10.5)
Chloride: 101 mEq/L (ref 96–112)
Creatinine, Ser: 1.26 mg/dL (ref 0.40–1.50)
GFR: 78.21 mL/min (ref >60.00)
Glucose, Bld: 115 mg/dL — ABNORMAL HIGH (ref 70–99)
POTASSIUM: 3.6 meq/L (ref 3.5–5.1)
SODIUM: 138 meq/L (ref 135–145)

## 2017-07-24 LAB — SEDIMENTATION RATE: Sed Rate: 30 mm/hr — ABNORMAL HIGH (ref 0–15)

## 2017-07-24 NOTE — Progress Notes (Signed)
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 Pulmonary Consult    Referred by Dr Bertram Denver. Pt c/o non prod cough x 5 months "bothers me day and night".  He states he has also had some bloody nasal d/c.   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 No unusual exposure hx or h/o childhood pna/ asthma or knowledge of premature birth.  Sleeping ok flat without nocturnal  or early am exacerbation  of respiratory  c/o's or need for noct saba. Also denies any obvious fluctuation of symptoms with weather or environmental changes or other aggravating or alleviating factors except as outlined above   Current Allergies, Complete Past Medical History, Past Surgical History, Family History, and Social History were reviewed in [employer] record.  ROS  The following are not active complaints unless bolded Hoarseness, sore throat, dysphagia, dental problems, itching, sneezing,  nasal congestion or discharge of excess mucus or bloody secretions, ear ache,   fever, chills, sweats, unintended wt loss or wt  gain, classically pleuritic or exertional cp,  orthopnea pnd or leg swelling, presyncope, palpitations, abdominal pain, anorexia, nausea, vomiting, diarrhea  or change in bowel habits or change in bladder habits, change in stools or change in urine, dysuria, hematuria,  rash, arthralgias, visual complaints, headache, numbness, weakness or ataxia or problems with walking or coordination,  change in mood/affect or memory.        Current Meds  Medication Sig  . fluticasone (FLONASE) 50 MCG/ACT nasal spray Place 1 spray into both nostrils daily.        Review of Systems     Objective:   Physical Exam    amb stoic bm nad / English is marginally adequate   Wt Readings from Last 3 Encounters:  07/24/17 156 lb 12.8 oz (71.1 kg)  07/04/17 158 lb (71.7 kg)  06/05/17 160 lb 6.4 oz (72.8 kg)     Vital signs reviewed - Note on arrival 02 sats  95% on RA   HEENT: nl dentition, turbinates bilaterally, and oropharynx. Nl external ear canals without cough reflex   NECK :  without JVD/Nodes/TM/ nl carotid upstrokes bilaterally   LUNGS: no acc muscle use,  Nl contour chest which is clear to A and P bilaterally without cough on insp or exp maneuvers   CV:  RRR  no s3 or murmur or increase in P2, and no edema   ABD:  soft and nontender with nl inspiratory excursion in the supine position. No bruits  or organomegaly appreciated, bowel sounds nl  MS:  Nl gait/ ext warm without deformities, calf tenderness, cyanosis or clubbing No obvious joint restrictions   SKIN: warm and dry without lesions    NEURO:  alert, approp, nl sensorium with  no motor or cerebellar deficits apparent.     CXR PA and Lateral:   07/24/2017 :    I personally reviewed images and agree with radiology impression as follows:    did not go as rec   Labs ordered/ reviewed:      Chemistry      Component Value Date/Time   NA 138 07/24/2017 1110   NA 142 05/11/2017 1012   K 3.6 07/24/2017 1110   CL 101 07/24/2017  1110   CO2 30 07/24/2017 1110   BUN 14 07/24/2017 1110   BUN 20 05/11/2017 1012   CREATININE 1.26 07/24/2017 1110      Component Value Date/Time   CALCIUM 9.5 07/24/2017 1110                             Lab Results  Component Value Date   WBC 6.6 07/24/2017   HGB 14.7 07/24/2017   HCT 43.8 07/24/2017   MCV 85.6 07/24/2017   PLT 355.0 07/24/2017       EOS                      0.4       07/24/2017        Lab Results  Component Value Date   ESRSEDRATE 30 (H) 07/24/2017       Quant TB  :  Indeterminate         Assessment:

## 2017-07-24 NOTE — Assessment & Plan Note (Addendum)
Allergy profile 07/24/2017 >  Eos 0. /  IgE  Pending  - Sinus CT ordered  The most common causes of chronic cough in immunocompetent adults include the following: upper airway cough syndrome (UACS), previously referred to as postnasal drip syndrome (PNDS), which is caused by variety of rhinosinus conditions; (2) asthma; (3) GERD; (4) chronic bronchitis from cigarette smoking or other inhaled environmental irritants; (5) nonasthmatic eosinophilic bronchitis; and (6) bronchiectasis.   These conditions, singly or in combination, have accounted for up to 94% of the causes of chronic cough in prospective studies.   Other conditions have constituted no >6% of the causes in prospective studies These have included bronchogenic carcinoma, chronic interstitial pneumonia, sarcoidosis, left ventricular failure, ACEI-induced cough, and aspiration from a condition associated with pharyngeal dysfunction.    Chronic cough is often simultaneously caused by more than one condition. A single cause has been found from 38 to 82% of the time, multiple causes from 18 to 62%. Multiply caused cough has been the result of three diseases up to 42% of the time.   Of the three most common causes of  Sub-acute or recurrent or chronic cough, only one (GERD)  can actually contribute to/ trigger  the other two (asthma and post nasal drip syndrome)  and perpetuate the cylce of cough.  While not intuitively obvious, many patients with chronic low grade reflux do not cough until there is a primary insult that disturbs the protective epithelial barrier and exposes sensitive nerve endings.   This is typically viral but can be direct physical injury such as with an endotracheal tube.   The point is that once this occurs, it is difficult to eliminate the cycle  using anything but a maximally effective acid suppression regimen at least in the short run, accompanied by an appropriate diet to address non acid GERD and control / eliminate the  cough itself for at least 3 days.   rec max rx for gerd/ control cyclical cough with delsym and f/u in 2 weeks with all meds in hand using a trust but verify approach to confirm accurate Medication  Reconciliation The principal here is that until we are certain that the  patients are doing what we've asked, it makes no sense to ask them to do more.

## 2017-07-24 NOTE — Patient Instructions (Addendum)
Try prilosec otc 20mg   Take 30-60 min before first meal of the day and Pepcid ac (famotidine) 20 mg one @  bedtime until cough is completely gone for at least a week without the need for cough suppression  Stop flonase   For cough > delsym 2 teaspoons every 12 hours as needed   GERD (REFLUX)  is an extremely common cause of respiratory symptoms just like yours , many times with no obvious heartburn at all.    It can be treated with medication, but also with lifestyle changes including elevation of the head of your bed (ideally with 6 inch  bed blocks),  Smoking cessation, avoidance of late meals, excessive alcohol, and avoid fatty foods, chocolate, peppermint, colas, red wine, and acidic juices such as orange juice.  NO MINT OR MENTHOL PRODUCTS SO NO COUGH DROPS   USE SUGARLESS CANDY INSTEAD (Jolley ranchers or Stover's or Life Savers) or even ice chips will also do - the key is to swallow to prevent all throat clearing. NO OIL BASED VITAMINS - use powdered substitutes.   Please see patient coordinator before you leave today  to schedule ct sinus  Please remember to go to the lab and x-ray department downstairs in the basement  for your tests - we will call you with the results when they are available.     Please schedule a follow up office visit in 2  weeks  with all medications /inhalers/ solutions in hand so we can verify exactly what you are taking. This includes all medications from all doctors and over the counters     Essayez de prendre du prilosec otc 20 mg 30  60 minutes avant le premier repas de la journe et du Pepcid ac (famotidine)  raison de 20 mg une heure au coucher, jusqu' ce que la toux disparaisse compltement pendant au moins une semaine sans qu'il soit ncessaire de supprimer les effets de la toux.  Arrtez flonase  Pour la toux> delsym 2 cuillres  caf toutes les 12 heures selon les besoins  GERD (REFLUX) est une cause extrmement frquente de symptmes  respiratoires, tout comme le vtre, souvent sans aucune brlure d'estomac vidente.  Il peut tre trait avec des ONEOKmdicaments, mais aussi avec des changements de mode de vie comprenant l'lvation de la tte de votre lit (idalement avec des blocs de 6 pouces), le renoncement au tabac, l'vitement des repas tardifs, l'alcool excessif, et d'viter les aliments gras, le chocolat, la menthe poivre, les colas , vin rouge et jus acides tels [employer]Martque le jus d'orange. AUCUN PRODUIT DE MENTHE OU DE MENTHOL DONC TOUT AUCUNE CHUTE UTILISEZ SUCARLESS CANDY INSTEAD (leveurs Jolley ou Stover ou Life Savers) ou mme des clats de glace feront galement l'affaire - l'essentiel est d'avaler pour prvenir tout dgagement de gorge. SANS VITAMINES  BASE D'HUILE - utilisez des substituts en poudre.  Veuillez consulter le coordinateur des patients avant votre dpart aujourd'hui pour planifier la prise en charge des sinus.  N'oubliez pas de vous rendre au laboratoire et au dpartement de radiographie au sous-sol pour vos tests. Nous vous contacterons avec les rsultats ds qu'ils seront disponibles.   Veuillez planifier une visite de suivi dans 2 semaines avec tous les mdicaments / inhalateurs / solutions en main afin que nous puissions vrifier exactement ce que vous prenez. Cela inclut tous les mdicaments de tous les mdecins et [employer]ur les comptoirs

## 2017-07-25 ENCOUNTER — Encounter: Payer: Self-pay | Admitting: Internal Medicine

## 2017-07-25 LAB — RESPIRATORY ALLERGY PROFILE REGION II ~~LOC~~
Allergen, A. alternata, m6: <0.1 kU/L
Allergen, Cedar tree, t12: <0.1 kU/L
Allergen, Comm Silver Birch, t9: <0.1 kU/L
Allergen, D pternoyssinus,d7: <0.1 kU/L
Allergen, Mulberry, t76: <0.1 kU/L
Allergen, P. notatum, m1: <0.1 kU/L
Aspergillus fumigatus, m3: <0.1 kU/L
BERMUDA GRASS: 0.11 kU/L — AB
CLADOSPORIUM HERBARUM (M2) IGE: <0.1 kU/L
CLASS: 0
CLASS: 0
CLASS: 0
CLASS: 0
CLASS: 0
CLASS: 0
CLASS: 0
CLASS: 0
CLASS: 0
CLASS: 0
CLASS: 1
COMMON RAGWEED (SHORT) (W1) IGE: <0.1 kU/L
Cat Dander: <0.1 kU/L
Class: 0
Class: 0
Class: 0
Class: 0
Class: 0
Class: 0
Class: 0
Class: 0
Class: 0
Class: 0
Class: 0
Class: 0
Class: 3
D. farinae: <0.1 kU/L
Dog Dander: <0.1 kU/L
Elm IgE: 0.47 kU/L — ABNORMAL HIGH
IgE (Immunoglobulin E), Serum: 114 kU/L (ref <=114)
Johnson Grass: <0.1 kU/L
Pecan/Hickory Tree IgE: 13.1 kU/L — ABNORMAL HIGH
Rough Pigweed  IgE: <0.1 kU/L
Sheep Sorrel IgE: <0.1 kU/L

## 2017-07-25 LAB — INTERPRETATION:

## 2017-07-25 NOTE — Assessment & Plan Note (Addendum)
See CT chest 06/13/17  Patchy bilateral infiltrates are identified without significant consolidation. Some associated nodularity likely postinflammatory in nature is seen. The largest of these measures approximately 6 mm. Non-contrast chest CT at 3-6 months is recommended - 07/24/2017   ESR = 30   ddx includes granomatous dz esp sarcoid/ nodular boop/ collagen vasc dz and pulmonary vasculitis esp early WG - strongly doubt tb or ca.  pt did  Not complete the w/u but will start with sinus CT and rx for gerd to see to what extent the cough is upper vs lower airway in etiology ie may have little to do with the changes on cxr/ct which may be chronic and unrelated. F/u in 2 weeks to regroup re further w/u.   Total time devoted to counseling  > 50 % of initial 45 min office visit:  review case with pt/ discussion of options/alternatives/ personally creating written customized instructions  in presence of pt  then going over those specific  Instructions directly with the pt including how to use all of the meds but in particular covering each new medication in detail and the difference between the maintenance= "automatic" meds and the prns using an action plan format for the latter (If this problem/symptom => do that organization reading Left to right).  Please see AVS from this visit for a full list of these instructions which I personally wrote for this pt and  are unique to this visit.

## 2017-07-27 ENCOUNTER — Ambulatory Visit (INDEPENDENT_AMBULATORY_CARE_PROVIDER_SITE_OTHER)
Admission: RE | Admit: 2017-07-27 | Discharge: 2017-07-27 | Disposition: A | Discharge status: Home / Self Care | Payer: Self-pay | Source: Ambulatory Visit | Attending: Internal Medicine | Admitting: Internal Medicine

## 2017-07-27 ENCOUNTER — Telehealth: Payer: Self-pay | Admitting: Internal Medicine

## 2017-07-27 DIAGNOSIS — R05 Cough: Secondary | ICD-10-CM

## 2017-07-27 DIAGNOSIS — R059 Cough, unspecified: Secondary | ICD-10-CM

## 2017-07-27 DIAGNOSIS — R918 Other nonspecific abnormal finding of lung field: Secondary | ICD-10-CM

## 2017-07-27 MED ORDER — AMOXICILLIN-POT CLAVULANATE 875-125 MG PO TABS: 1.0000 | ORAL_TABLET | Freq: Two times a day (BID) | ORAL | 0 refills | Status: DC

## 2017-07-27 NOTE — Progress Notes (Signed)
LMTCB

## 2017-07-27 NOTE — Telephone Encounter (Signed)
Notes recorded by Nyoka CowdenWert, Michael B, MD on 07/27/2017 at 11:00 AM EST Call patient : Study is c/w sinusitis rec Augmentin 875 mg take one pill twice daily X 10 days - take at breakfast and supper with large glass of water. It would help reduce the usual side effects (diarrhea and yeast infections) if you ate cultured yogurt at lunch.   Spoke with patient. He is aware of results. Medication has been called into pharmacy.   Nothing else needed at time of call.

## 2017-08-09 ENCOUNTER — Ambulatory Visit (INDEPENDENT_AMBULATORY_CARE_PROVIDER_SITE_OTHER): Payer: Self-pay | Admitting: Internal Medicine

## 2017-08-09 ENCOUNTER — Other Ambulatory Visit (INDEPENDENT_AMBULATORY_CARE_PROVIDER_SITE_OTHER): Payer: Self-pay

## 2017-08-09 ENCOUNTER — Encounter: Payer: Self-pay | Admitting: Internal Medicine

## 2017-08-09 VITALS — BP 112/70 | HR 105 | Ht 71.0 in | Wt 155.8 lb

## 2017-08-09 DIAGNOSIS — R053 Chronic cough: Secondary | ICD-10-CM

## 2017-08-09 DIAGNOSIS — R918 Other nonspecific abnormal finding of lung field: Secondary | ICD-10-CM

## 2017-08-09 DIAGNOSIS — R059 Cough, unspecified: Secondary | ICD-10-CM

## 2017-08-09 DIAGNOSIS — R05 Cough: Secondary | ICD-10-CM

## 2017-08-09 LAB — URINALYSIS
Bilirubin Urine: NEGATIVE
HGB URINE DIPSTICK: NEGATIVE
Ketones, ur: NEGATIVE
Leukocytes, UA: NEGATIVE
NITRITE: NEGATIVE
SPECIFIC GRAVITY, URINE: 1.015 (ref 1.000–1.030)
TOTAL PROTEIN, URINE-UPE24: NEGATIVE
URINE GLUCOSE: NEGATIVE
Urobilinogen, UA: 0.2 (ref 0.0–1.0)
pH: 6 (ref 5.0–8.0)

## 2017-08-09 MED ORDER — AMOXICILLIN-POT CLAVULANATE 875-125 MG PO TABS: 1.0000 | ORAL_TABLET | Freq: Two times a day (BID) | ORAL | 0 refills | Status: DC

## 2017-08-09 NOTE — Patient Instructions (Addendum)
Augmentin 875 mg take one pill twice daily  X 10 days - take at breakfast and supper with large glass of water.  It would help reduce the usual side effects (diarrhea and yeast infections) if you ate cultured yogurt at lunch.   Please see patient coordinator before you leave today  to schedule sinus CT at 2 weeks and return here after it   Please remember to go to the  x-ray department downstairs in the basement  for your tests - we will call you with the results when they are available. Add:  Did not go to xray, needs one on return       Augmentin 875 mg prend un comprim deux fois par jour X 10 jours - prenez au petit-djeuner et le dner [employer]c un grand verre d'eau. Cela aiderait  rduire les effets secondaires habituels (diarrhe et infections  levures) si vous mangiez du yaourt Automotive engineercultiv au djeuner.  Veuillez consulter le coordinateur des patients avant votre dpart aujourd'hui pour Chief of Staffplanifier le scanner sinusal  2 semaines et revenir [employer] aprs.   N'oubliez pas de vous rendre au service de radiologie au sous-sol, au sous-sol, pour vos tests. Nous vous contacterons avec les rsultats ds qu'ils seront disponibles.

## 2017-08-09 NOTE — Assessment & Plan Note (Signed)
See CT chest 06/13/17  Patchy bilateral infiltrates are identified without significant consolidation. Some associated nodularity likely postinflammatory in nature is seen. The largest of these measures approximately 6 mm. Non-contrast chest CT at 3-6 months is recommended - 07/24/2017   ESR = 30  Quant TB  07/24/17   :  Indeterminate   Improving with rx for augmentin > f/u cxr in 2 weeks

## 2017-08-09 NOTE — Progress Notes (Signed)
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 Pulmonary Consult    Referred by Dr Bertram DenverZelda Fleming. Pt c/o non prod cough x 5 months "bothers me day and night".  He states he has also had some bloody nasal d/c.   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  Cough Worse at hs immediately mostly dry and says present 24/7 but never coughed during ov  rec Try prilosec otc 20mg   Take 30-60 min before first meal of the day and Pepcid ac (famotidine) 20 mg one @  bedtime until cough is completely gone for at least a week without the need for cough suppression Stop flonase  For cough > delsym 2 teaspoons every 12 hours as needed  GERD (REFLUX)  Please see patient coordinator before you leave today  to schedule ct sinus> acute sinusitis > rx 10 days augmetin    08/09/2017  F/u ov/Shane Fleming re:  Cough since summer 2018  Chief Complaint  Patient presents with  . Follow-up    Breathing has improved to his normal baseline. He is coughing less, but has not had a cough-free day.   doe =MMRC1 = can walk nl pace, flat grade, can't hurry or go uphills or steps s sob   Sleep ok  Cough wore hs and in am with mucopurulent secretions, same from nose / but better p 10 d augmentin  Unclear as to whether ever receive     No obvious day to day or daytime variability or assoc  mucus  plugs or hemoptysis or cp or chest tightness, subjective wheeze or overt sinus or hb symptoms. No unusual exposure hx or h/o childhood pna/ asthma or knowledge of premature birth.    Also denies any obvious fluctuation of symptoms with weather or environmental changes or other aggravating or alleviating factors except as outlined above   Current Allergies, Complete Past Medical History, Past Surgical History, Family History, and Social History were reviewed in [facility].  ROS  The following are not active complaints unless bolded Hoarseness, sore throat, dysphagia, dental problems, itching, sneezing,  nasal congestion or discharge of excess mucus or purulent secretions, ear ache,   fever, chills, sweats, unintended wt loss or wt gain, classically pleuritic or exertional cp,  orthopnea pnd or leg swelling, presyncope, palpitations, abdominal pain, anorexia, nausea, vomiting, diarrhea  or change in bowel habits or change in bladder habits, change in stools or change in urine, dysuria, hematuria,  rash, arthralgias, visual complaints, headache, numbness, weakness or ataxia or problems with walking or coordination,  change in mood/affect or memory.        Current Meds  Medication Sig  . dextromethorphan (DELSYM) 30 MG/5ML liquid Take by mouth 2 (two) times daily as needed for cough.  .Marland Kitchen  famotidine (PEPCID) 20 MG tablet Take 20 mg by mouth at bedtime.  Marland Kitchen omeprazole (PRILOSEC OTC) 20 MG tablet Take 20 mg by mouth daily before breakfast.                        Objective:   Physical Exam   pleasant amb bm nad    08/09/2017         155   07/24/17 156 lb 12.8 oz (71.1 kg)  07/04/17 158 lb (71.7 kg)  06/05/17 160 lb 6.4 oz (72.8 kg)    Vital signs reviewed - Note on arrival 02 sats  98% on RA     HEENT: nl dentition, turbinates bilaterally, and oropharynx. Nl external ear canals without cough reflex   NECK :  without JVD/Nodes/TM/ nl carotid upstrokes  bilaterally   LUNGS: no acc muscle use,  Nl contour chest which is clear to A and P bilaterally without cough on insp or exp maneuvers   CV:  RRR  no s3 or murmur or increase in P2, and no edema   ABD:  soft and nontender with nl inspiratory excursion in the supine position. No bruits or organomegaly appreciated, bowel sounds nl  MS:  Nl gait/ ext warm without deformities, calf tenderness, cyanosis or clubbing No obvious joint restrictions   SKIN: warm and dry without lesions    NEURO:  alert, approp, nl sensorium with  no motor or cerebellar deficits apparent.        CXR PA and Lateral:   08/09/2017 :    I personally reviewed images and agree with radiology impression as follows:   Did not go to xray as req    Labs   Reviewed 08/09/2017       Chemistry      Component Value Date/Time   NA 138 07/24/2017 1110   NA 142 05/11/2017 1012   K 3.6 07/24/2017 1110   CL 101 07/24/2017 1110   CO2 30 07/24/2017 1110   BUN 14 07/24/2017 1110   BUN 20 05/11/2017 1012   CREATININE 1.26 07/24/2017 1110      Component Value Date/Time   CALCIUM 9.5 07/24/2017 1110                             Lab Results  Component Value Date   WBC 6.6 07/24/2017   HGB 14.7 07/24/2017   HCT 43.8 07/24/2017   MCV 85.6 07/24/2017   PLT 355.0 07/24/2017       EOS                      0.4       07/24/2017        Lab Results  Component Value Date   ESRSEDRATE 30 (H) 07/24/2017       Quant TB  07/24/17   :  Indeterminate    U/a 08/09/2017     Neg blood/ protein        Assessment:

## 2017-08-09 NOTE — Assessment & Plan Note (Signed)
Allergy profile 07/24/2017 >  Eos 0.4 /  IgE  114  RAST trees/grass - Sinus CT 07/27/2017  Right maxillary sinus fluid suggests a component of acute sinusitis > rec augmentin x 10 days> improved symptoms 08/09/2017 > repeat 10 day then ov with sinus CT

## 2017-08-24 ENCOUNTER — Encounter: Payer: Self-pay | Admitting: Internal Medicine

## 2017-08-24 ENCOUNTER — Other Ambulatory Visit: Payer: Self-pay

## 2017-08-24 ENCOUNTER — Ambulatory Visit (INDEPENDENT_AMBULATORY_CARE_PROVIDER_SITE_OTHER)
Admission: RE | Admit: 2017-08-24 | Discharge: 2017-08-24 | Disposition: A | Discharge status: Home / Self Care | Payer: Self-pay | Source: Ambulatory Visit | Attending: Internal Medicine | Admitting: Internal Medicine

## 2017-08-24 ENCOUNTER — Ambulatory Visit (INDEPENDENT_AMBULATORY_CARE_PROVIDER_SITE_OTHER): Payer: Self-pay | Admitting: Internal Medicine

## 2017-08-24 VITALS — BP 118/78 | HR 92 | Ht 71.0 in | Wt 160.0 lb

## 2017-08-24 DIAGNOSIS — R053 Chronic cough: Secondary | ICD-10-CM

## 2017-08-24 DIAGNOSIS — R918 Other nonspecific abnormal finding of lung field: Secondary | ICD-10-CM

## 2017-08-24 DIAGNOSIS — R059 Cough, unspecified: Secondary | ICD-10-CM

## 2017-08-24 DIAGNOSIS — R05 Cough: Secondary | ICD-10-CM

## 2017-08-24 MED ORDER — AMOXICILLIN-POT CLAVULANATE 875-125 MG PO TABS: 1.0000 | ORAL_TABLET | Freq: Two times a day (BID) | ORAL | 0 refills | Status: AC

## 2017-08-24 NOTE — Progress Notes (Signed)
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 Pulmonary Consult    Referred by Dr Bertram Denver. Pt c/o non prod cough x 5 months "bothers me day and night".  He states he has also had some bloody nasal d/c.   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  Cough Worse at hs immediately mostly dry and says present 24/7 but never coughed during ov  rec Try prilosec otc 20mg   Take 30-60 min before first meal of the day and Pepcid ac (famotidine) 20 mg one @  bedtime until cough is completely gone for at least a week without the need for cough suppression Stop flonase  For cough > delsym 2 teaspoons every 12 hours as needed  GERD (REFLUX)  Please see patient coordinator before you leave today  to schedule ct sinus> acute sinusitis > rx 10 days augmetin    08/09/2017  F/u ov/Jaeliana Lococo re:  Cough since summer 2018  Chief Complaint  Patient presents with  . Follow-up    Breathing has improved to his normal baseline. He is coughing less, but has not had a cough-free day.   doe =MMRC1 = can walk nl pace, flat grade, can't hurry or go uphills or steps s sob   Sleep ok  Cough wore hs and in am with mucopurulent secretions, same from nose / but better p 10 d augmentin  Unclear as to whether ever received  rec Augmentin 875 mg take one pill twice daily  X 10 days - take  at breakfast and supper with large glass of water.  It would help reduce the usual side effects (diarrhea and yeast infections) if you ate cultured yogurt at lunch.   schedule sinus CT at 2 weeks > still acute sinusitis  Please remember to go to the  x-ray department downstairs in the basement  for your tests - we will call you with the results when they are available. Add:  Did not go to xray, needs one on return     08/24/2017  f/u ov/Demiya Magno re: cough / ? ILD assoc with sinusitis improved on augmentin Chief Complaint  Patient presents with  . Follow-up    CT results, continues to have productive cough (yellow)   Dyspnea:  Not a problem Cough: better but still yellow mucus exp in am  Sleep: not waking him up    No obvious day to day or daytime variability or assoc  mucus plugs or hemoptysis or cp or chest tightness, subjective wheeze or overt sinus or hb symptoms. No unusual exposure hx or h/o childhood pna/ asthma or knowledge of premature birth.  Sleeping ok flat without nocturnal   exacerbation  of respiratory  c/o's or need for noct saba. Also denies any obvious fluctuation of  symptoms with weather or environmental changes or other aggravating or alleviating factors except as outlined above   Current Allergies, Complete Past Medical History, Past Surgical History, Family History, and Social History were reviewed in [facility] electronic medical record.  ROS  The following are not active complaints unless bolded Hoarseness, sore throat, dysphagia, dental problems, itching, sneezing,  nasal congestion or discharge of excess mucus or purulent secretions, ear ache,   fever, chills, sweats, unintended wt loss or wt gain, classically pleuritic or exertional cp,  orthopnea pnd or leg swelling, presyncope, palpitations, abdominal pain, anorexia, nausea, vomiting, diarrhea  or change in bowel habits or change in bladder habits, change in stools or change in urine, dysuria, hematuria,  rash,  arthralgias, visual complaints, headache, numbness, weakness or ataxia or problems with walking or coordination,  change in mood/affect or memory.        Current Meds  Medication Sig  . dextromethorphan (DELSYM) 30 MG/5ML liquid Take by mouth 2 (two) times daily as needed for cough.  . famotidine (PEPCID) 20 MG tablet Take 20 mg by mouth at bedtime.  Marland Kitchen. omeprazole (PRILOSEC OTC) 20 MG tablet Take 20 mg by mouth daily before breakfast.  .                      Objective:   Physical Exam   pleasant amb bm nad   08/26/2017        160   08/09/2017         155   07/24/17 156 lb 12.8 oz (71.1 kg)  07/04/17 158 lb (71.7 kg)  06/05/17 160 lb 6.4 oz (72.8 kg)    Vital signs reviewed - Note on arrival 02 sats  98% on RA     HEENT: nl dentition, turbinates bilaterally, and oropharynx. Nl external ear canals without cough reflex   NECK :  without JVD/Nodes/TM/ nl carotid upstrokes bilaterally   LUNGS: no acc muscle use,  Nl contour chest which is clear to A and P bilaterally without cough on insp or exp maneuvers   CV:  RRR  no s3 or murmur or increase in P2, and no edema   ABD:  soft and nontender with nl inspiratory excursion in the supine position. No bruits or organomegaly appreciated, bowel sounds nl  MS:  Nl gait/ ext warm without deformities, calf tenderness, cyanosis or clubbing No obvious joint restrictions   SKIN: warm and dry without lesions    NEURO:  alert, approp, nl sensorium with  no motor or cerebellar deficits apparent.     CXR PA and Lateral:   08/24/2017 :    I personally reviewed images and agree with radiology impression as follows:   Improvement of patchy bibasilar opacities. Persistent airway thickening and interstitial densities bilaterally which may reflect chronic atypical infection or other chronic interstitial disease.       Labs   Reviewed 08/24/2017        Chemistry      Component Value Date/Time   NA 138 07/24/2017 1110   NA 142  05/11/2017 1012   K 3.6 07/24/2017 1110   CL 101 07/24/2017 1110   CO2 30 07/24/2017 1110   BUN 14 07/24/2017 1110   BUN 20 05/11/2017 1012   CREATININE 1.26 07/24/2017 1110      Component Value Date/Time   CALCIUM 9.5 07/24/2017 1110  Lab Results  Component Value Date   WBC 6.6 07/24/2017   HGB 14.7 07/24/2017   HCT 43.8 07/24/2017   MCV 85.6 07/24/2017   PLT 355.0 07/24/2017       EOS                      0.4       07/24/2017        Lab Results  Component Value Date   ESRSEDRATE 30 (H) 07/24/2017                  Assessment:

## 2017-08-24 NOTE — Patient Instructions (Addendum)
Please remember to go to the  x-ray department downstairs in the basement  for your tests - we will call you with the results when they are available.  Augmentin 875 mg take one pill twice daily  X 10 days - take at breakfast and supper with large glass of water.  It would help reduce the usual side effects (diarrhea and yeast infections) if you ate cultured yogurt at lunch.    Please see patient coordinator before you leave today  to schedule ent eval   Please schedule a follow up office visit in 6 weeks, call sooner if needed     N'oubliez pas de vous rendre au service de radiologie au sous-sol, au sous-sol, pour vos tests. Nous vous contacterons avec les rsultats ds qu'ils seront disponibles. Augmentin 875 mg prend un comprim deux fois par jour X 10 jours - prenez au petit-djeuner et le dner [employer] un grand verre d'eau. Cela aiderait  rduire les effets secondaires habituels (diarrhe et infections  levures) si vous mangiez du yaourt [employer] au djeuner. Veuillez consulter le coordinateur des patients avant votre dpart aujourd'hui pour Research scientist (life sciences)planifier votre valuation. Veuillez planifier une visite de suivi dans 6 semaines, appelez plus tt si

## 2017-08-26 ENCOUNTER — Encounter: Payer: Self-pay | Admitting: Internal Medicine

## 2017-08-26 NOTE — Assessment & Plan Note (Signed)
See CT chest 06/13/17  Patchy bilateral infiltrates are identified without significant consolidation. Some associated nodularity likely postinflammatory in nature is seen. The largest of these measures approximately 6 mm. Non-contrast chest CT at 3-6 months is recommended - 07/24/2017   ESR = 30  Quant TB  07/24/17   :  Indeterminate - pt reports rec'd BCG   Clearly better with rx for sinusitis so will address this first then return in 6 weeks

## 2017-08-26 NOTE — Assessment & Plan Note (Signed)
Allergy profile 07/24/2017 >  Eos 0.4 /  IgE  114  RAST trees/grass - Sinus CT 07/27/2017  Right maxillary sinus fluid suggests a component of acute sinusitis > rec augmentin x 10 days> improved symptoms 08/09/2017 > repeat 10 day then ov with sinus CT > done 08/24/2017 still sinusitis so rec 10 more days and then ent eval

## 2017-08-27 NOTE — Progress Notes (Signed)
Spoke with pt and notified of results per Dr. Wert. Pt verbalized understanding and denied any questions. 

## 2017-09-03 DIAGNOSIS — J324 Chronic pansinusitis: Secondary | ICD-10-CM | POA: Insufficient documentation

## 2017-10-01 DIAGNOSIS — H6983 Other specified disorders of Eustachian tube, bilateral: Secondary | ICD-10-CM | POA: Insufficient documentation

## 2017-10-01 DIAGNOSIS — R42 Dizziness and giddiness: Secondary | ICD-10-CM | POA: Insufficient documentation

## 2017-10-05 ENCOUNTER — Encounter: Payer: Self-pay | Admitting: Internal Medicine

## 2017-10-05 ENCOUNTER — Ambulatory Visit (INDEPENDENT_AMBULATORY_CARE_PROVIDER_SITE_OTHER): Payer: Self-pay | Admitting: Internal Medicine

## 2017-10-05 VITALS — BP 116/72 | HR 92 | Ht 71.0 in | Wt 163.0 lb

## 2017-10-05 DIAGNOSIS — R918 Other nonspecific abnormal finding of lung field: Secondary | ICD-10-CM

## 2017-10-05 DIAGNOSIS — R05 Cough: Secondary | ICD-10-CM

## 2017-10-05 DIAGNOSIS — R053 Chronic cough: Secondary | ICD-10-CM

## 2017-10-05 MED ORDER — AMOXICILLIN-POT CLAVULANATE 875-125 MG PO TABS: 1.0000 | ORAL_TABLET | Freq: Two times a day (BID) | ORAL | 0 refills | Status: DC

## 2017-10-05 NOTE — Progress Notes (Signed)
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 Pulmonary Consult    Referred by Dr Bertram Denver. Pt c/o non prod cough x 5 months "bothers me day and night".  He states he has also had some bloody nasal d/c.   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  Cough Worse at hs immediately mostly dry and says present 24/7 but never coughed during ov  rec Try prilosec otc 20mg   Take 30-60 min before first meal of the day and Pepcid ac (famotidine) 20 mg one @  bedtime until cough is completely gone for at least a week without the need for cough suppression Stop flonase  For cough > delsym 2 teaspoons every 12 hours as needed  GERD (REFLUX)  Please see patient coordinator before you leave today  to schedule ct sinus> acute sinusitis > rx 10 days augmetin    08/09/2017  F/u ov/Shane Fleming re:  Cough since summer 2018  Chief Complaint  Patient presents with  . Follow-up    Breathing has improved to his normal baseline. He is coughing less, but has not had a cough-free day.   doe =MMRC1 = can walk nl pace, flat grade, can't hurry or go uphills or steps s sob   Sleep ok  Cough wore hs and in am with mucopurulent secretions, same from nose / but better p 10 d augmentin  Unclear as to whether ever received  rec Augmentin 875 mg take one pill twice daily  X 10 days - take  at breakfast and supper with large glass of water.  It would help reduce the usual side effects (diarrhea and yeast infections) if you ate cultured yogurt at lunch.   schedule sinus CT at 2 weeks > still acute sinusitis  Please remember to go to the  x-ray department downstairs in the basement  for your tests - we will call you with the results when they are available. Add:  Did not go to xray, needs one on return     08/24/2017  f/u ov/Shane Fleming re: cough / ? ILD assoc with sinusitis improved on augmentin Chief Complaint  Patient presents with  . Follow-up    CT results, continues to have productive cough (yellow)   Dyspnea:  Not a problem Cough: better but still yellow mucus exp in am  Sleep: not waking him up   rec  Augmentin 875 mg take one pill twice daily  X 10 days - take at breakfast and supper with large glass of water.  It would help reduce the usual side effects (diarrhea and yeast infections) if you ate cultured yogurt at lunch.  Please see patient coordinator before you leave today  to schedule ent eval       10/05/2017  f/u  ov/Shane Fleming re:  Sinusitis/ uacs  Chief Complaint  Patient presents with  . Follow-up    Cough has improved some. He c/o runny nose.   Dyspnea:  Not limited by breathing from desired activities   Cough: esp in am  Sleep: fine   No money for sinus surgery rec by Dr byers/ just finshed clinda by says augmentin worked the best                Objective:   Physical Exam  Pleasant bm nad   10/05/2017          163  08/26/2017        160   08/09/2017         155   07/24/17 156 lb 12.8 oz (71.1 kg)  07/04/17 158 lb (71.7 kg)  06/05/17 160 lb 6.4 oz (72.8 kg)       HEENT: nl dentition, and oropharynx. Nl external ear canals without cough reflex - moderate bilateral non-specific turbinate edema  / no purulent secretions    NECK :  without JVD/Nodes/TM/ nl carotid upstrokes bilaterally   LUNGS: no acc muscle use,  Nl contour chest which is clear to A  and P bilaterally without cough on insp or exp maneuvers   CV:  RRR  no s3 or murmur or increase in P2, and no edema   ABD:  soft and nontender with nl inspiratory excursion in the supine position. No bruits or organomegaly appreciated, bowel sounds nl  MS:  Nl gait/ ext warm without deformities, calf tenderness, cyanosis or clubbing No obvious joint restrictions   SKIN: warm and dry without lesions    NEURO:  alert, approp, nl sensorium with  no motor or cerebellar deficits apparent.        CXR PA and Lateral:   08/24/2017 :    I personally reviewed images and agree with radiology impression as follows:   Improvement of patchy bibasilar opacities. Persistent airway thickening and interstitial densities bilaterally which may reflect chronic atypical infection or other chronic interstitial disease.                  Assessment:

## 2017-10-05 NOTE — Patient Instructions (Addendum)
Saline spray over the counter may help  If your cough or nasal symptoms get worse > Augmentin 875 mg take one pill twice daily  X 10 days - take at breakfast and supper with large glass of water.  It would help reduce the usual side effects (diarrhea and yeast infections) if you ate cultured yogurt at lunch.   You could ask if the surgery could be done at [facility] but the surgery fee would likely be the same    Please schedule a follow up visit in 3 months but call sooner if needed with cxr on return

## 2017-10-07 ENCOUNTER — Encounter: Payer: Self-pay | Admitting: Internal Medicine

## 2017-10-07 NOTE — Assessment & Plan Note (Addendum)
Allergy profile 07/24/2017 >  Eos 0.4 /  IgE  114  RAST trees/grass - Sinus CT 07/27/2017  Right maxillary sinus fluid suggests a component of acute sinusitis > rec augmentin x 10 days> improved symptoms 08/09/2017 > repeat 10 day then ov with sinus CT > 08/24/2017 still sinusitis so rec 10 more days and ent eval > rec surgery but can't afford   Will discuss with Dr Jenne PaneBates office ? [facility]   In meantime if worsens ok to take another 10 days of augmentin    Each maintenance medication was reviewed in detail including most importantly the difference between maintenance and as needed and under what circumstances the prns are to be used.  Please see AVS for specific  Instructions which are unique to this visit and I personally typed out  which were reviewed in detail in writing with the patient and a copy provided.

## 2017-10-07 NOTE — Assessment & Plan Note (Signed)
See CT chest 06/13/17  Patchy bilateral infiltrates are identified without significant consolidation. Some associated nodularity likely postinflammatory in nature is seen. The largest of these measures approximately 6 mm. Non-contrast chest CT at 3-6 months is recommended - 07/24/2017   ESR = 30  Quant TB  07/24/17   :  Indeterminate - pt reports rec'd BCG  Discussed in detail all the  indications, usual  risks and alternatives  relative to the benefits with patient who agrees to proceed with conservative f/u with cxr in 72m

## 2017-10-08 ENCOUNTER — Telehealth: Payer: Self-pay | Admitting: Internal Medicine

## 2017-10-08 MED ORDER — AMOXICILLIN-POT CLAVULANATE 875-125 MG PO TABS: 1.0000 | ORAL_TABLET | Freq: Two times a day (BID) | ORAL | 0 refills | Status: AC

## 2017-10-08 NOTE — Telephone Encounter (Signed)
Pt states he misplaced the rx for augmentin that we gave at his visit  He is requesting that we send this to [facility] for him  Rx was sent Nothing further needed

## 2017-11-28 ENCOUNTER — Ambulatory Visit: Payer: Self-pay | Attending: Nurse Practitioner

## 2018-01-04 ENCOUNTER — Ambulatory Visit: Payer: Self-pay | Admitting: Internal Medicine

## 2019-11-04 IMAGING — CT CT CHEST W/O CM
2 of 3 series · 15 of 36 positions shown, 18 images · non-contrast
Comparison: 11/08/2010

CLINICAL DATA: Fever and productive cough for several months

EXAM:
CT CHEST WITHOUT CONTRAST
TECHNIQUE: Multidetector CT imaging of the chest was performed following the
standard protocol without IV contrast.

[Series 3: chest w/o 2mm st · axial · non-contrast · 0.75mm/px · z∈[-247,+47]mm · 12 of 173 slices shown, 15 images]
[im 13/173  mediastinal]
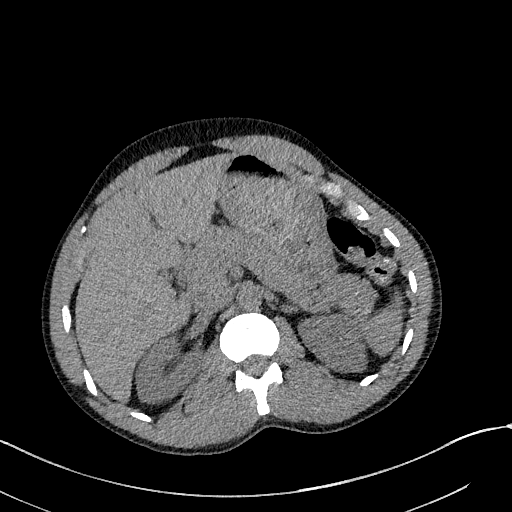
[im 13/173  lung]
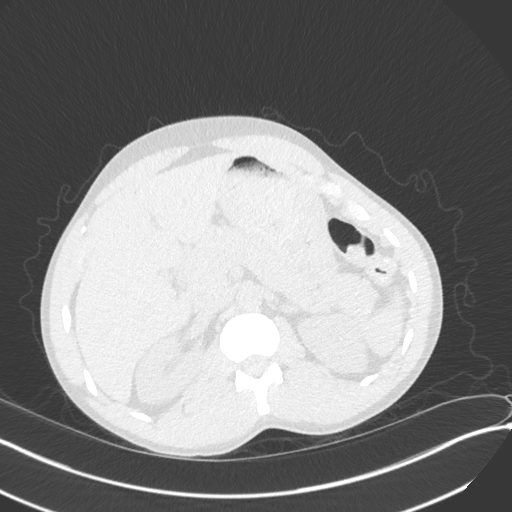
[im 26/173  lung]
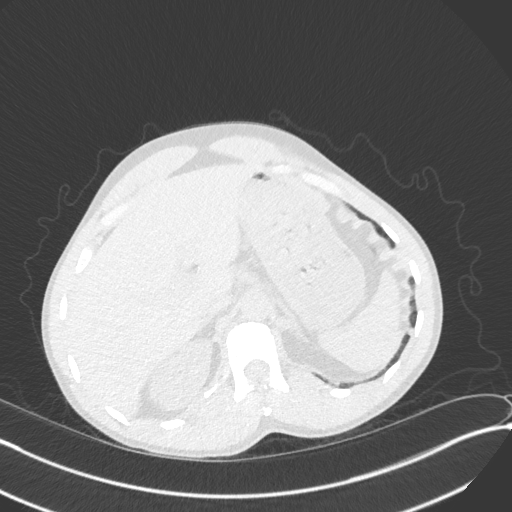
[im 39/173  lung]
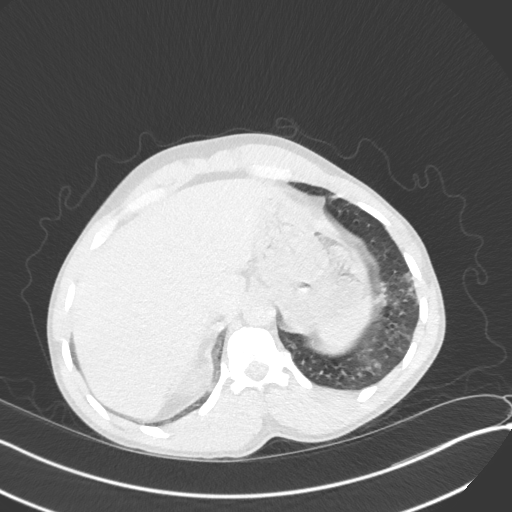
[im 51/173  lung]
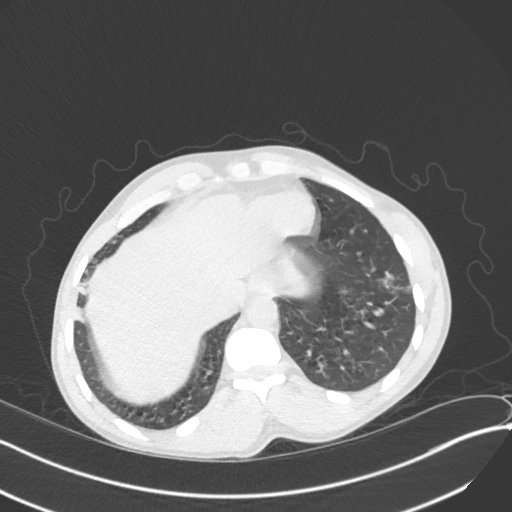
[im 64/173  mediastinal]
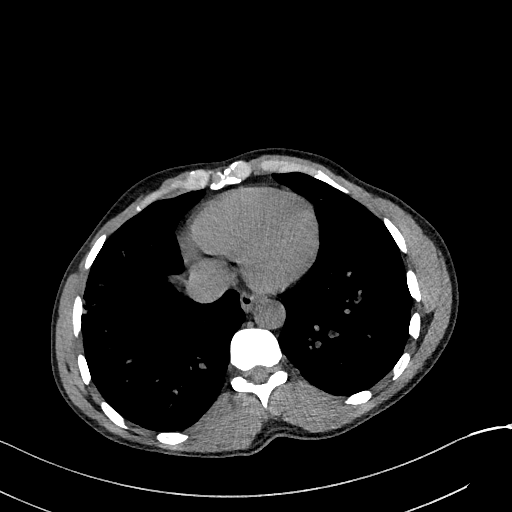
[im 64/173  lung]
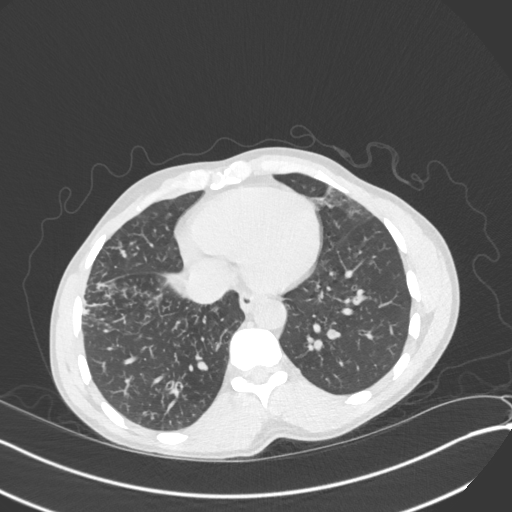
[im 77/173  lung]
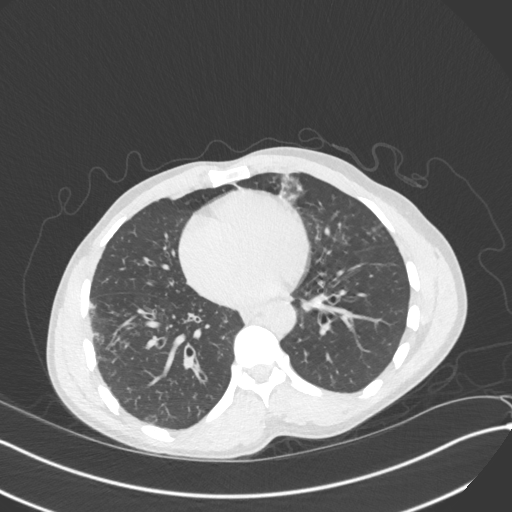
[im 96/173  lung]
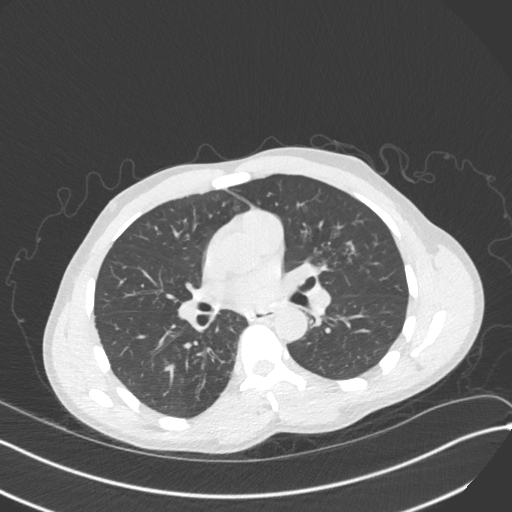
[im 109/173  lung]
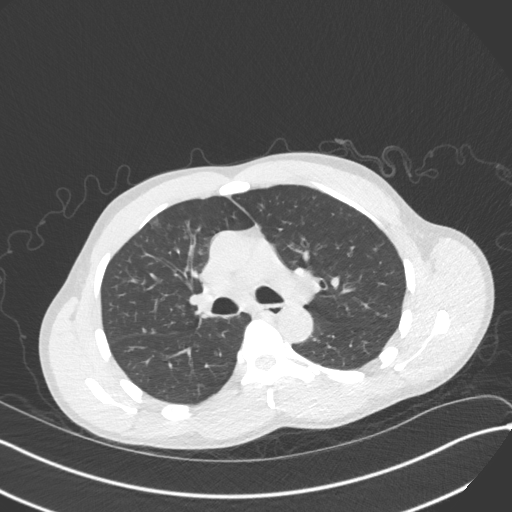
[im 122/173  mediastinal]
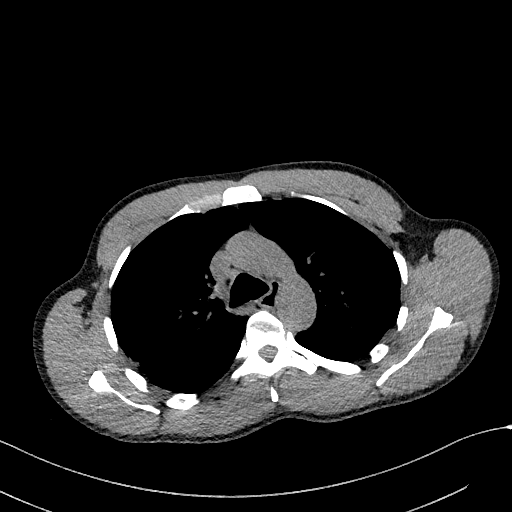
[im 122/173  lung]
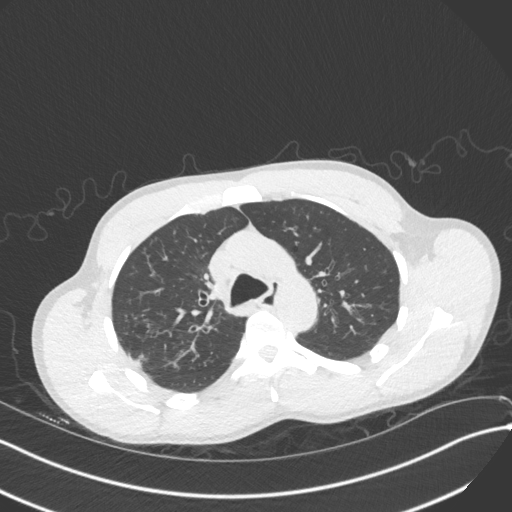
[im 134/173  lung]
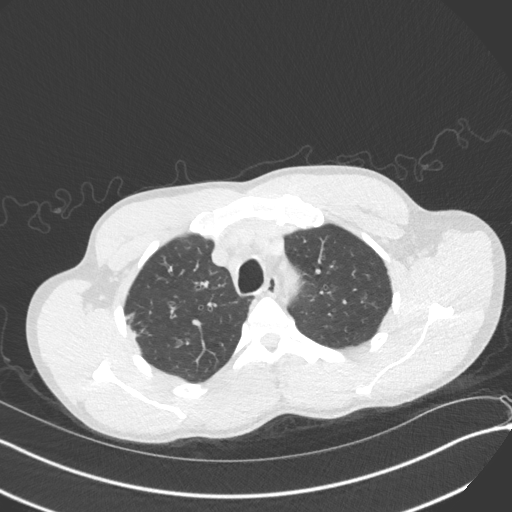
[im 147/173  lung]
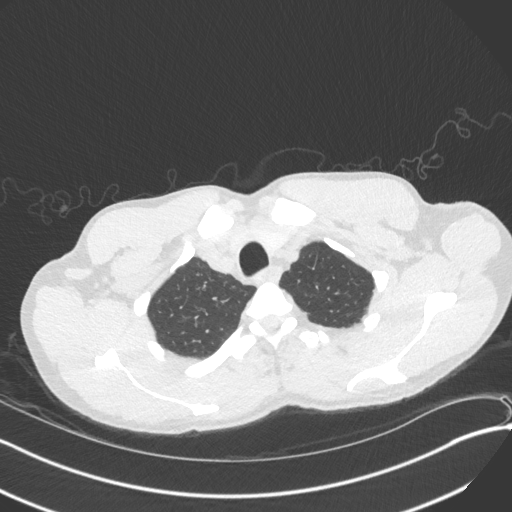
[im 160/173  lung]
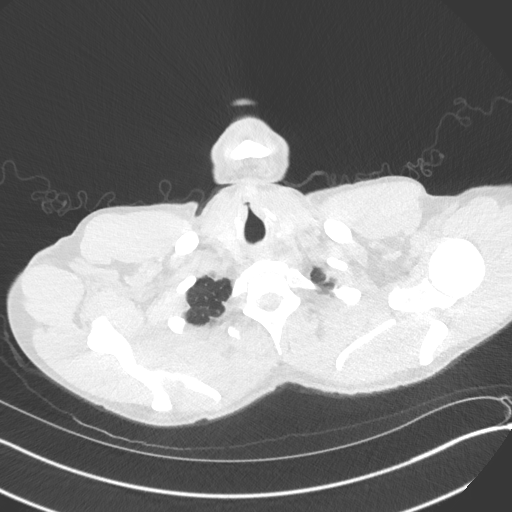

[Series 5: chest w/o 3mm st cor · coronal · non-contrast · 0.68mm/px · 3 of 84 slices shown]
[im 17/84  lung]
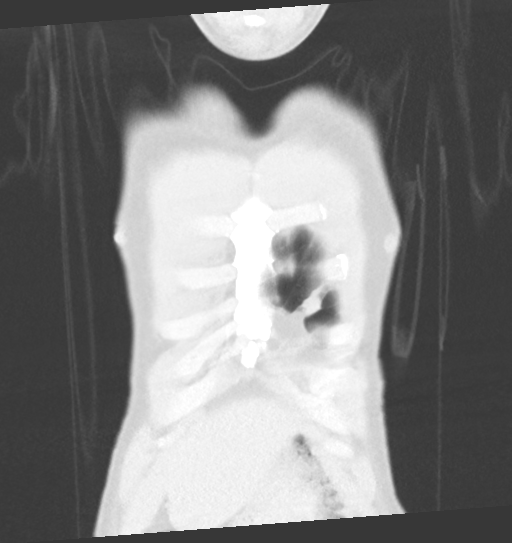
[im 34/84  lung]
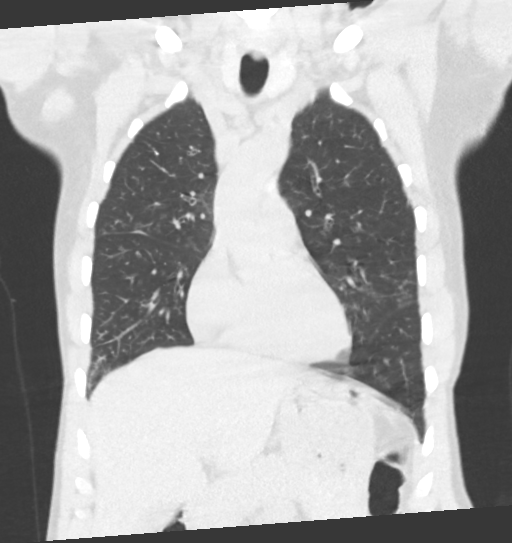
[im 50/84  lung]
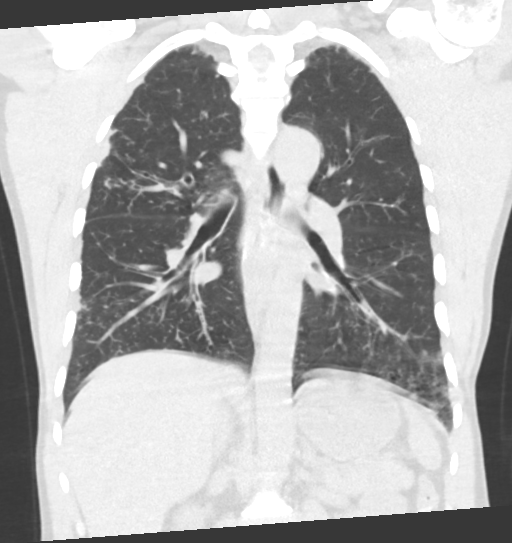

[15 of 36 positions shown; findings below may reference images not displayed]

FINDINGS: Cardiovascular: Somewhat limited due to lack of IV contrast. No
significant aneurysmal dilatation of the aorta is noted. No
atherosclerotic changes are seen. No cardiac enlargement is noted.
No coronary calcifications are seen.

Mediastinum/Nodes: The thoracic inlet is within normal limits.
Scattered small mediastinal lymph nodes are noted likely reactive in
nature. No significant hilar or mediastinal adenopathy is noted. The
esophagus is within normal limits.

Lungs/Pleura: The lungs are well aerated bilaterally. Some patchy
infiltrative changes are again noted throughout both lungs with
areas of mucous plugging. Some small nodules are identified, the
largest of these is noted in the right upper lobe best seen on image
number 69 of series 4. The infiltrative changes are related in part
to some mucous plugging. The nodular changes are likely
postinflammatory in nature. No sizable effusion or pneumothorax is
noted. A few scattered calcifications are noted likely related to
prior granulomatous disease.

Upper Abdomen: No acute abnormality.

Musculoskeletal: Degenerative changes of the thoracic spine are
noted.
IMPRESSION: Patchy bilateral infiltrates are identified without significant
consolidation. Some associated nodularity likely postinflammatory in
nature is seen. The largest of these measures approximately 6 mm.
Non-contrast chest CT at 3-6 months is recommended. If the nodules
are stable at time of repeat CT, then future CT at 18-24 months
(from today's scan) is considered optional for low-risk patients,
but is recommended for high-risk patients. This recommendation
follows the consensus statement: Guidelines for Management of
Incidental Pulmonary Nodules Detected on CT Images: From the

No other focal abnormality is noted.

## 2021-02-11 ENCOUNTER — Emergency Department (HOSPITAL_COMMUNITY): Payer: Self-pay

## 2021-02-11 ENCOUNTER — Other Ambulatory Visit: Payer: Self-pay

## 2021-02-11 ENCOUNTER — Encounter (HOSPITAL_COMMUNITY): Payer: Self-pay | Admitting: Internal Medicine

## 2021-02-11 ENCOUNTER — Inpatient Hospital Stay (HOSPITAL_COMMUNITY)
Admission: EM | Admit: 2021-02-11 | Discharge: 2021-02-18 | DRG: 177 | Disposition: A | Discharge status: Home / Self Care | Payer: Self-pay | Attending: Internal Medicine | Admitting: Internal Medicine

## 2021-02-11 DIAGNOSIS — J9622 Acute and chronic respiratory failure with hypercapnia: Secondary | ICD-10-CM | POA: Diagnosis present

## 2021-02-11 DIAGNOSIS — R0602 Shortness of breath: Secondary | ICD-10-CM

## 2021-02-11 DIAGNOSIS — J9621 Acute and chronic respiratory failure with hypoxia: Secondary | ICD-10-CM | POA: Diagnosis present

## 2021-02-11 DIAGNOSIS — J9602 Acute respiratory failure with hypercapnia: Secondary | ICD-10-CM | POA: Diagnosis present

## 2021-02-11 DIAGNOSIS — J984 Other disorders of lung: Secondary | ICD-10-CM

## 2021-02-11 DIAGNOSIS — R062 Wheezing: Secondary | ICD-10-CM

## 2021-02-11 DIAGNOSIS — E43 Unspecified severe protein-calorie malnutrition: Secondary | ICD-10-CM | POA: Insufficient documentation

## 2021-02-11 DIAGNOSIS — J150 Pneumonia due to Klebsiella pneumoniae: Principal | ICD-10-CM | POA: Diagnosis present

## 2021-02-11 DIAGNOSIS — J471 Bronchiectasis with (acute) exacerbation: Secondary | ICD-10-CM | POA: Diagnosis present

## 2021-02-11 DIAGNOSIS — J329 Chronic sinusitis, unspecified: Secondary | ICD-10-CM | POA: Diagnosis present

## 2021-02-11 DIAGNOSIS — J470 Bronchiectasis with acute lower respiratory infection: Secondary | ICD-10-CM | POA: Diagnosis present

## 2021-02-11 DIAGNOSIS — Z20822 Contact with and (suspected) exposure to covid-19: Secondary | ICD-10-CM | POA: Diagnosis present

## 2021-02-11 DIAGNOSIS — Z681 Body mass index (BMI) 19 or less, adult: Secondary | ICD-10-CM

## 2021-02-11 DIAGNOSIS — E876 Hypokalemia: Secondary | ICD-10-CM | POA: Diagnosis present

## 2021-02-11 DIAGNOSIS — R634 Abnormal weight loss: Secondary | ICD-10-CM | POA: Diagnosis present

## 2021-02-11 DIAGNOSIS — R778 Other specified abnormalities of plasma proteins: Secondary | ICD-10-CM | POA: Diagnosis present

## 2021-02-11 DIAGNOSIS — Z825 Family history of asthma and other chronic lower respiratory diseases: Secondary | ICD-10-CM

## 2021-02-11 DIAGNOSIS — R0902 Hypoxemia: Secondary | ICD-10-CM

## 2021-02-11 DIAGNOSIS — J479 Bronchiectasis, uncomplicated: Secondary | ICD-10-CM

## 2021-02-11 DIAGNOSIS — J9601 Acute respiratory failure with hypoxia: Secondary | ICD-10-CM | POA: Diagnosis present

## 2021-02-11 DIAGNOSIS — I459 Conduction disorder, unspecified: Secondary | ICD-10-CM | POA: Diagnosis present

## 2021-02-11 LAB — CBC
HCT: 46.5 % (ref 39.0–52.0)
Hemoglobin: 14.6 g/dL (ref 13.0–17.0)
MCH: 28.5 pg (ref 26.0–34.0)
MCHC: 31.4 g/dL (ref 30.0–36.0)
MCV: 90.6 fL (ref 80.0–100.0)
Platelets: 343 10*3/uL (ref 150–400)
RBC: 5.13 MIL/uL (ref 4.22–5.81)
RDW: 13.2 % (ref 11.5–15.5)
WBC: 8.7 10*3/uL (ref 4.0–10.5)
nRBC: 0 % (ref 0.0–0.2)

## 2021-02-11 LAB — CBC WITH DIFFERENTIAL/PLATELET
Abs Immature Granulocytes: 0.03 10*3/uL (ref 0.00–0.07)
Basophils Absolute: 0 10*3/uL (ref 0.0–0.1)
Basophils Relative: 0 %
Eosinophils Absolute: 0 10*3/uL (ref 0.0–0.5)
Eosinophils Relative: 0 %
HCT: 44.3 % (ref 39.0–52.0)
Hemoglobin: 14.2 g/dL (ref 13.0–17.0)
Immature Granulocytes: 0 %
Lymphocytes Relative: 5 %
Lymphs Abs: 0.4 10*3/uL — ABNORMAL LOW (ref 0.7–4.0)
MCH: 28.5 pg (ref 26.0–34.0)
MCHC: 32.1 g/dL (ref 30.0–36.0)
MCV: 89 fL (ref 80.0–100.0)
Monocytes Absolute: 0 10*3/uL — ABNORMAL LOW (ref 0.1–1.0)
Monocytes Relative: 0 %
Neutro Abs: 6.4 10*3/uL (ref 1.7–7.7)
Neutrophils Relative %: 95 %
Platelets: 352 10*3/uL (ref 150–400)
RBC: 4.98 MIL/uL (ref 4.22–5.81)
RDW: 13.3 % (ref 11.5–15.5)
WBC: 6.8 10*3/uL (ref 4.0–10.5)
nRBC: 0 % (ref 0.0–0.2)

## 2021-02-11 LAB — I-STAT ARTERIAL BLOOD GAS, ED
Acid-Base Excess: 5 mmol/L — ABNORMAL HIGH (ref 0.0–2.0)
Bicarbonate: 31.7 mmol/L — ABNORMAL HIGH (ref 20.0–28.0)
Calcium, Ion: 1.27 mmol/L (ref 1.15–1.40)
HCT: 43 % (ref 39.0–52.0)
Hemoglobin: 14.6 g/dL (ref 13.0–17.0)
O2 Saturation: 80 %
Patient temperature: 98.2
Potassium: 3.8 mmol/L (ref 3.5–5.1)
Sodium: 139 mmol/L (ref 135–145)
TCO2: 33 mmol/L — ABNORMAL HIGH (ref 22–32)
pCO2 arterial: 52.7 mmHg — ABNORMAL HIGH (ref 32.0–48.0)
pH, Arterial: 7.387 (ref 7.350–7.450)
pO2, Arterial: 45 mmHg — ABNORMAL LOW (ref 83.0–108.0)

## 2021-02-11 LAB — C-REACTIVE PROTEIN: CRP: 3 mg/dL — ABNORMAL HIGH (ref <1.0)

## 2021-02-11 LAB — COMPREHENSIVE METABOLIC PANEL
ALT: 22 U/L (ref 0–44)
AST: 29 U/L (ref 15–41)
Albumin: 3.3 g/dL — ABNORMAL LOW (ref 3.5–5.0)
Alkaline Phosphatase: 57 U/L (ref 38–126)
Anion gap: 8 (ref 5–15)
BUN: 19 mg/dL (ref 6–20)
CO2: 30 mmol/L (ref 22–32)
Calcium: 9.5 mg/dL (ref 8.9–10.3)
Chloride: 99 mmol/L (ref 98–111)
Creatinine, Ser: 1.26 mg/dL — ABNORMAL HIGH (ref 0.61–1.24)
GFR, Estimated: >60 mL/min (ref >60)
Glucose, Bld: 102 mg/dL — ABNORMAL HIGH (ref 70–99)
Potassium: 3.6 mmol/L (ref 3.5–5.1)
Sodium: 137 mmol/L (ref 135–145)
Total Bilirubin: 0.5 mg/dL (ref 0.3–1.2)
Total Protein: 8.2 g/dL — ABNORMAL HIGH (ref 6.5–8.1)

## 2021-02-11 LAB — PROCALCITONIN: Procalcitonin: <0.1 ng/mL

## 2021-02-11 LAB — TSH: TSH: 0.829 u[IU]/mL (ref 0.350–4.500)

## 2021-02-11 LAB — RESP PANEL BY RT-PCR (FLU A&B, COVID) ARPGX2
Influenza A by PCR: NEGATIVE
Influenza B by PCR: NEGATIVE
SARS Coronavirus 2 by RT PCR: NEGATIVE

## 2021-02-11 LAB — TROPONIN I (HIGH SENSITIVITY)
Troponin I (High Sensitivity): 5 ng/L (ref <18)
Troponin I (High Sensitivity): 6 ng/L (ref <18)

## 2021-02-11 LAB — HEPATITIS PANEL, ACUTE
HCV Ab: NONREACTIVE
Hep A IgM: NONREACTIVE
Hep B C IgM: NONREACTIVE
Hepatitis B Surface Ag: NONREACTIVE

## 2021-02-11 LAB — SEDIMENTATION RATE: Sed Rate: 29 mm/hr — ABNORMAL HIGH (ref 0–16)

## 2021-02-11 LAB — HIV ANTIBODY (ROUTINE TESTING W REFLEX): HIV Screen 4th Generation wRfx: NONREACTIVE

## 2021-02-11 MED ORDER — ALBUTEROL SULFATE (2.5 MG/3ML) 0.083% IN NEBU: 5.0000 mg | INHALATION_SOLUTION | Freq: Once | RESPIRATORY_TRACT | Status: AC

## 2021-02-11 MED ORDER — METHYLPREDNISOLONE SODIUM SUCC 125 MG IJ SOLR
125.0000 mg | Freq: Once | INTRAMUSCULAR | Status: AC
  Administered 2021-02-11: 125 mg via INTRAVENOUS
  Filled 2021-02-11: qty 2

## 2021-02-11 MED ORDER — ENOXAPARIN SODIUM 40 MG/0.4ML IJ SOSY
40.0000 mg | PREFILLED_SYRINGE | INTRAMUSCULAR | Status: DC
  Administered 2021-02-11 – 2021-02-17 (×7): 40 mg via SUBCUTANEOUS
  Filled 2021-02-11 (×7): qty 0.4

## 2021-02-11 MED ORDER — GUAIFENESIN ER 600 MG PO TB12
600.0000 mg | ORAL_TABLET | Freq: Two times a day (BID) | ORAL | Status: DC
  Administered 2021-02-11 – 2021-02-18 (×15): 600 mg via ORAL
  Filled 2021-02-11 (×15): qty 1

## 2021-02-11 MED ORDER — ALBUTEROL SULFATE (2.5 MG/3ML) 0.083% IN NEBU: 2.5000 mg | INHALATION_SOLUTION | RESPIRATORY_TRACT | Status: DC | PRN

## 2021-02-11 MED ORDER — ONDANSETRON HCL 4 MG PO TABS: 4.0000 mg | ORAL_TABLET | Freq: Four times a day (QID) | ORAL | Status: DC | PRN

## 2021-02-11 MED ORDER — ARFORMOTEROL TARTRATE 15 MCG/2ML IN NEBU
15.0000 ug | INHALATION_SOLUTION | Freq: Two times a day (BID) | RESPIRATORY_TRACT | Status: DC
  Administered 2021-02-11 – 2021-02-18 (×9): 15 ug via RESPIRATORY_TRACT
  Filled 2021-02-11 (×14): qty 2

## 2021-02-11 MED ORDER — PREDNISONE 20 MG PO TABS
40.0000 mg | ORAL_TABLET | Freq: Every day | ORAL | Status: DC
  Administered 2021-02-12 – 2021-02-18 (×7): 40 mg via ORAL
  Filled 2021-02-11 (×7): qty 2

## 2021-02-11 MED ORDER — ALBUTEROL SULFATE (2.5 MG/3ML) 0.083% IN NEBU
2.5000 mg | INHALATION_SOLUTION | Freq: Four times a day (QID) | RESPIRATORY_TRACT | Status: DC
  Administered 2021-02-11 – 2021-02-12 (×4): 2.5 mg via RESPIRATORY_TRACT
  Filled 2021-02-11 (×4): qty 3

## 2021-02-11 MED ORDER — ALBUTEROL SULFATE (2.5 MG/3ML) 0.083% IN NEBU
INHALATION_SOLUTION | RESPIRATORY_TRACT | Status: AC
  Administered 2021-02-11: 5 mg via RESPIRATORY_TRACT
  Filled 2021-02-11: qty 6

## 2021-02-11 MED ORDER — SODIUM CHLORIDE 0.9% FLUSH
3.0000 mL | Freq: Two times a day (BID) | INTRAVENOUS | Status: DC
  Administered 2021-02-11 – 2021-02-16 (×11): 3 mL via INTRAVENOUS

## 2021-02-11 MED ORDER — ACETAMINOPHEN 650 MG RE SUPP: 650.0000 mg | Freq: Four times a day (QID) | RECTAL | Status: DC | PRN

## 2021-02-11 MED ORDER — LACTATED RINGERS IV BOLUS
500.0000 mL | Freq: Once | INTRAVENOUS | Status: AC
  Administered 2021-02-11: 500 mL via INTRAVENOUS

## 2021-02-11 MED ORDER — ALBUTEROL SULFATE HFA 108 (90 BASE) MCG/ACT IN AERS
2.0000 | INHALATION_SPRAY | Freq: Once | RESPIRATORY_TRACT | Status: AC
  Administered 2021-02-11: 2 via RESPIRATORY_TRACT
  Filled 2021-02-11: qty 6.7

## 2021-02-11 MED ORDER — IOHEXOL 350 MG/ML SOLN
75.0000 mL | Freq: Once | INTRAVENOUS | Status: AC | PRN
  Administered 2021-02-11: 65 mL via INTRAVENOUS

## 2021-02-11 MED ORDER — ALBUTEROL SULFATE HFA 108 (90 BASE) MCG/ACT IN AERS
2.0000 | INHALATION_SPRAY | RESPIRATORY_TRACT | Status: DC | PRN
Start: 2021-02-11 — End: 2021-02-11

## 2021-02-11 MED ORDER — ONDANSETRON HCL 4 MG/2ML IJ SOLN
4.0000 mg | Freq: Four times a day (QID) | INTRAMUSCULAR | Status: DC | PRN
  Administered 2021-02-17: 4 mg via INTRAVENOUS

## 2021-02-11 MED ORDER — ACETAMINOPHEN 325 MG PO TABS: 650.0000 mg | ORAL_TABLET | Freq: Four times a day (QID) | ORAL | Status: DC | PRN

## 2021-02-11 NOTE — ED Notes (Signed)
Nurse report given to Amrah, RN. Now awaiting transport.

## 2021-02-11 NOTE — H&P (Addendum)
History and Physical    Shane Fleming OVF:[id number] DOB: Nov 29, 1970 DOA: 02/11/2021  Referring MD/NP/PA: Pattricia Boss, MD PCP: Gildardo Pounds, NP  Patient coming from: Home  Chief Complaint: Shortness of breath  I have personally briefly reviewed patient's old medical records in [facility]   HPI: Shane Fleming is a 50 y.o. male with medical history significant presents with complaints of cough and shortness of breath over the last 3 weeks.  Cough is productive with yellowish sputum production and no complaints of blood.  Notes associated symptoms of wheezing, no appetite, weight loss of approximately 32 pounds, abdominal pain with  coughing, and lightheadedness with standing.  Patient has no history of smoking, alcohol use, or illicit drug use.  Patient denies any travel outside of the [place] reporting that he has lived here in [place] for over 20 years.  Patient last had issues with a pansinusitis several years ago for which she was seen by ENT and pulmonology.  ED Course: Upon admission into the emergency department patient was seen to be afebrile with pulse 93-1 12, respiration 20-30, and O2 saturations lowest 87% on room air with improvement on 2 L of nasal cannula oxygen to greater than 92%.  ABG was obtained and revealed pH 7.387, PCO2 52.7, and PO2 45.  Labs were relatively unremarkable and near patient's baseline.  Patient had been given 500 mL of normal saline IV fluids, albuterol , and Solu-Medrol 125 mg IV.  COVID-19 and influenza screening was negative.  Review of Systems  Constitutional:  Positive for malaise/fatigue and weight loss. Negative for fever.  HENT:  Negative for ear discharge and nosebleeds.   Eyes:  Negative for photophobia and pain.  Respiratory:  Positive for cough, sputum production and shortness of breath. Negative for hemoptysis.   Cardiovascular:  Negative for chest pain and leg swelling.  Gastrointestinal:  Positive for abdominal pain (With  coughing). Negative for nausea and vomiting.  Genitourinary:  Negative for dysuria and hematuria.  Musculoskeletal:  Negative for falls.  Skin:  Negative for rash.  Neurological:  Positive for dizziness. Negative for focal weakness and loss of consciousness.  Psychiatric/Behavioral:  Negative for substance abuse.    Past Medical History:  Diagnosis Date   Sinusitis 2018    Past Surgical History:  Procedure Laterality Date   APPENDECTOMY     LAPAROSCOPIC APPENDECTOMY N/A 04/08/2015   Procedure: APPENDECTOMY LAPAROSCOPIC;  Surgeon: Excell Seltzer, MD;  Location: [facility];  Service: General;  Laterality: N/A;     reports that he has never smoked. He has never used smokeless tobacco. He reports that he does not drink alcohol and does not use drugs.  No Known Allergies  Family History  Problem Relation Age of Onset   Asthma Father     Prior to Admission medications   Not on File    Physical Exam:  Constitutional: Thin middle-age male who appears to be acutely ill Vitals:   02/11/21 1115 02/11/21 1136 02/11/21 1138 02/11/21 1155  BP: 128/87   134/87  Pulse: (!) 103 (!) 104 (!) 102 97  Resp: (!) 23 (!) 30 (!) 28 20  Temp:   98.2 F (36.8 C)   TempSrc:   Oral   SpO2: 99% (!) 87% 95% 98%   Eyes: PERRL, lids and conjunctivae normal ENMT: Mucous membranes are moist. Posterior pharynx clear of any exudate or lesions. Neck: normal, supple, no masses, no thyromegaly Respiratory: Tachypneic with expiratory wheezes scattered in both lung fields.  Patient currently  on 2 L nasal cannula oxygen with O2 saturations maintained. Cardiovascular: Tachycardic, no murmurs / rubs / gallops. No extremity edema. 2+ pedal pulses. No carotid bruits.  Abdomen: no tenderness, no masses palpated. No hepatosplenomegaly. Bowel sounds positive.  Musculoskeletal: no clubbing / cyanosis. No joint deformity upper and lower extremities. Good ROM, no contractures. Normal muscle tone.  Skin: no rashes,  lesions, ulcers. No induration Neurologic: CN 2-12 grossly intact. Sensation intact, DTR normal. Strength 5/5 in all 4.  Psychiatric: Normal judgment and insight. Alert and oriented x 3. Normal mood.     Labs on Admission: I have personally reviewed following labs and imaging studies  CBC: Recent Labs  Lab 02/11/21 0748 02/11/21 1146  WBC 8.7  --   HGB 14.6 14.6  HCT 46.5 43.0  MCV 90.6  --   PLT 343  --    Basic Metabolic Panel: Recent Labs  Lab 02/11/21 0748 02/11/21 1146  NA 137 139  K 3.6 3.8  CL 99  --   CO2 30  --   GLUCOSE 102*  --   BUN 19  --   CREATININE 1.26*  --   CALCIUM 9.5  --    GFR: CrCl cannot be calculated (Unknown ideal weight.). Liver Function Tests: Recent Labs  Lab 02/11/21 0748  AST 29  ALT 22  ALKPHOS 57  BILITOT 0.5  PROT 8.2*  ALBUMIN 3.3*   No results for input(s): LIPASE, AMYLASE in the last 168 hours. No results for input(s): AMMONIA in the last 168 hours. Coagulation Profile: No results for input(s): INR, PROTIME in the last 168 hours. Cardiac Enzymes: No results for input(s): CKTOTAL, CKMB, CKMBINDEX, TROPONINI in the last 168 hours. BNP (last 3 results) No results for input(s): PROBNP in the last 8760 hours. HbA1C: No results for input(s): HGBA1C in the last 72 hours. CBG: No results for input(s): GLUCAP in the last 168 hours. Lipid Profile: No results for input(s): CHOL, HDL, LDLCALC, TRIG, CHOLHDL, LDLDIRECT in the last 72 hours. Thyroid Function Tests: No results for input(s): TSH, T4TOTAL, FREET4, T3FREE, THYROIDAB in the last 72 hours. Anemia Panel: No results for input(s): VITAMINB12, FOLATE, FERRITIN, TIBC, IRON, RETICCTPCT in the last 72 hours. Urine analysis:    Component Value Date/Time   COLORURINE YELLOW 08/09/2017 1005   APPEARANCEUR CLEAR 08/09/2017 1005   LABSPEC 1.015 08/09/2017 1005   PHURINE 6.0 08/09/2017 1005   GLUCOSEU NEGATIVE 08/09/2017 1005   HGBUR NEGATIVE 08/09/2017 1005   BILIRUBINUR  NEGATIVE 08/09/2017 1005   KETONESUR NEGATIVE 08/09/2017 1005   UROBILINOGEN 0.2 08/09/2017 1005   NITRITE NEGATIVE 08/09/2017 1005   LEUKOCYTESUR NEGATIVE 08/09/2017 1005   Sepsis Labs: Recent Results (from the past 240 hour(s))  Resp Panel by RT-PCR (Flu A&B, Covid) Nasopharyngeal Swab     Status: None   Collection Time: 02/11/21  7:43 AM   Specimen: Nasopharyngeal Swab; Nasopharyngeal(NP) swabs in vial transport medium  Result Value Ref Range Status   SARS Coronavirus 2 by RT PCR NEGATIVE NEGATIVE Final    Comment: (NOTE) SARS-CoV-2 target nucleic acids are NOT DETECTED.  The SARS-CoV-2 RNA is generally detectable in upper respiratory specimens during the acute phase of infection. The lowest concentration of SARS-CoV-2 viral copies this assay can detect is 138 copies/mL. A negative result does not preclude SARS-Cov-2 infection and should not be used as the sole basis for treatment or other patient management decisions. A negative result may occur with  improper specimen collection/handling, submission of specimen other than nasopharyngeal  swab, presence of viral mutation(s) within the areas targeted by this assay, and inadequate number of viral copies(<138 copies/mL). A negative result must be combined with clinical observations, patient history, and epidemiological information. The expected result is Negative.  Fact Sheet for Patients:  EntrepreneurPulse.com.au  Fact Sheet for Healthcare Providers:  IncredibleEmployment.be  This test is no t yet approved or cleared by the [place] FDA and  has been authorized for detection and/or diagnosis of SARS-CoV-2 by FDA under an Emergency Use Authorization (EUA). This EUA will remain  in effect (meaning this test can be used) for the duration of the COVID-19 declaration under Section 564(b)(1) of the Act, 21 U.S.C.section 360bbb-3(b)(1), unless the authorization is terminated  or revoked  sooner.       Influenza A by PCR NEGATIVE NEGATIVE Final   Influenza B by PCR NEGATIVE NEGATIVE Final    Comment: (NOTE) The Xpert Xpress SARS-CoV-2/FLU/RSV plus assay is intended as an aid in the diagnosis of influenza from Nasopharyngeal swab specimens and should not be used as a sole basis for treatment. Nasal washings and aspirates are unacceptable for Xpert Xpress SARS-CoV-2/FLU/RSV testing.  Fact Sheet for Patients: EntrepreneurPulse.com.au  Fact Sheet for Healthcare Providers: IncredibleEmployment.be  This test is not yet approved or cleared by the [place] FDA and has been authorized for detection and/or diagnosis of SARS-CoV-2 by FDA under an Emergency Use Authorization (EUA). This EUA will remain in effect (meaning this test can be used) for the duration of the COVID-19 declaration under Section 564(b)(1) of the Act, 21 U.S.C. section 360bbb-3(b)(1), unless the authorization is terminated or revoked.  Performed at [facility], [place] [address] [place], [place] [address]      Radiological Exams on Admission: DG Chest 2 View  Result Date: 02/11/2021 CLINICAL DATA:  Productive cough for 3 weeks EXAM: CHEST - 2 VIEW COMPARISON:  Chest radiograph 08/24/2017 FINDINGS: The cardiomediastinal silhouette is within normal limits. There are patchy opacities projecting over the spine on the lateral projection which are unchanged since 2018 which may reflect scarring. Slightly increased interstitial markings throughout are unchanged. There is no focal consolidation or pulmonary edema. There is no pleural effusion or pneumothorax. There is no acute osseous abnormality. IMPRESSION: Stable chest with no radiographic evidence of acute cardiopulmonary process. Electronically Signed   By: Valetta Mole MD   On: 02/11/2021 08:40   CT Angio Chest PE W and/or Wo Contrast  Result Date: 02/11/2021 CLINICAL DATA:  PE suspected.  High probability.  EXAM: CT ANGIOGRAPHY CHEST WITH CONTRAST TECHNIQUE: Multidetector CT imaging of the chest was performed using the standard protocol during bolus administration of intravenous contrast. Multiplanar CT image reconstructions and MIPs were obtained to evaluate the vascular anatomy. CONTRAST:  26m OMNIPAQUE IOHEXOL 350 MG/ML SOLN COMPARISON:  CT chest 06/13/2017. FINDINGS: Cardiovascular: Satisfactory opacification of the pulmonary arteries to the segmental level. No evidence of pulmonary embolism. Normal heart size. No pericardial effusion. Mediastinum/Nodes: No enlarged mediastinal, hilar, or axillary lymph nodes. Thyroid gland, trachea, and esophagus demonstrate no significant findings. Lungs/Pleura: There is diffuse bronchial wall thickening. Bilateral lower lobe predominant cylindrical bronchiectasis is identified. Mucoid impaction within posterior right lower lobe segmental and subsegmental airways noted. There is diffuse bilateral upper and lower lobe central lobular nodularity with scattered areas of upper lobe predominant ground-glass attenuation. A similar finding was seen on examination from 06/13/2017 but has progressed in the interval. Scattered patchy nodular areas of airspace density are also no including: Subpleural nodular density in the  anterior right upper lobe measuring 9 mm, image 52/6. Part solid nodule within the periphery of the left upper lobe measures 7 mm, image 79/6. Subpleural nodule within the posterolateral right lower lobe measures 5 mm, image 125/6. Upper Abdomen: No acute abnormality. Musculoskeletal: No chest wall abnormality. No acute or significant osseous findings. Review of the MIP images confirms the above findings. IMPRESSION: 1. No evidence for acute pulmonary embolus. 2. Diffuse bilateral upper and lower lobe predominant centrilobular nodularity with scattered areas of ground-glass attenuation. There is also mild diffuse cylindrical type bronchiectasis with areas of lower lobe  mucoid impaction. This appears progressive when compared with CT of the chest from 06/13/2017. Imaging findings are favored to represent an chronic progressive atypical inflammatory/infectious process. MAI (Mycobacterium avium intracellulare) is not excluded. Consider referral to pulmonary medicine for further management. 3. A few scattered, nonspecific, non centrilobular nodules are noted bilaterally. Recommend follow-up imaging with repeat CT of the chest in 3-6 months to assess temporal change in the appearance of these nodules. 4. Aortic atherosclerosis. Aortic Atherosclerosis (ICD10-I70.0). Electronically Signed   By: Kerby Moors M.D.   On: 02/11/2021 12:35    EKG: Independently reviewed.  Sinus tachycardia 110 bpm  Assessment/Plan Acute respiratory failure with hypoxia and hypercapnia secondary to nodular bronchiectasis with exacerbation: Patient presents with complaints of cough and shortness of breath.  On physical exam patient with mild scattered expiratory wheezes left lung fields.  O2 saturations noted to be as low as 87% on room air with improvement on 2 L of nasal cannula oxygen.  Subsequent ABG noted normal pH with PCO2 52.7 and PO2 40.  Chest x-ray noting multiple scattered areas of groundglass attenuation bronchiectasis and mucous impaction noted.  Influenza and COVID screening were negative. -Admit to medical telemetry bed -Continue airborne precautions -Continuous pulse oximetry with nasal cannula oxygen maintain O2 saturation greater than 90% -Check sputum culture, fungus culture, sputum/stool ova parasite, and AFB smear with reflex culture -Check procalcitonin, ESR, CRP, IgE, IgG subclasses, rheumatoid factor -Flutter valve and CPT 4 times daily -Albuterol nebs 4 times daily and as needed -Brovana nebs twice daily -Prednisone 40 mg daily -Antitussives as needed -PCCM consulted, will follow-up for any further recommendations  Weight loss: Patient reports losing approximately  32 pounds over the last 3 weeks due to lack of appetite. -Check TSH and HIV -Check prealbumin tomorrow morning  Elevated gamma gap: Gamma gap elevated to 4.9. -Check hepatitis panel  DVT prophylaxis: Lovenox Code Status: Full Family Communication: Patient declined need to update family at this time Disposition Plan: Home Consults called: PCCM consult Admission status: Inpatient, require more than 2  Norval Morton MD [facility]   If 7PM-7AM, please contact night-coverage   02/11/2021, 1:10 PM

## 2021-02-11 NOTE — ED Provider Notes (Signed)
[facility] EMERGENCY DEPARTMENT Provider Note   CSN: [id number] Arrival date & time: 02/11/21  9371     History No chief complaint on file.   Shane Fleming is a 50 y.o. male.  HPI 50 year old male presents today complaining of cough, dyspnea, weight loss over 3 weeks.  He has had subjective fevers.  He has noted chills and abdominal pain.  He has had nausea but no vomiting.  He states his appetite has been poor but does not worsen the pain.  He reports he has cough productive of sputum pinkish sputum.  He is not a smoker.  Denies any history of tuberculosis and denies recently being out of the country.  He denies having chest pain     No past medical history on file.  Patient Active Problem List   Diagnosis Date Noted   Pulmonary infiltrates 07/24/2017   Chronic cough 07/24/2017   Acute perforated appendicitis 04/08/2015   Appendicitis 04/07/2015    Past Surgical History:  Procedure Laterality Date   APPENDECTOMY     LAPAROSCOPIC APPENDECTOMY N/A 04/08/2015   Procedure: APPENDECTOMY LAPAROSCOPIC;  Surgeon: Glenna Fellows, MD;  Location: [facility];  Service: General;  Laterality: N/A;       Family History  Problem Relation Age of Onset   Asthma Father     Social History   Tobacco Use   Smoking status: Never   Smokeless tobacco: Never  Substance Use Topics   Alcohol use: No   Drug use: No    Home Medications Prior to Admission medications   Not on File    Allergies    Patient has no known allergies.  Review of Systems   Review of Systems  All other systems reviewed and are negative.  Physical Exam Updated Vital Signs BP 134/87   Pulse 97   Temp 98.2 F (36.8 C) (Oral)   Resp 20   SpO2 98%   Physical Exam Vitals and nursing note reviewed.  Constitutional:      General: He is not in acute distress.    Appearance: He is ill-appearing.  HENT:     Head: Normocephalic.     Right Ear: External ear normal.     Left Ear:  External ear normal.     Nose: Nose normal.     Mouth/Throat:     Pharynx: Oropharynx is clear.  Eyes:     Extraocular Movements: Extraocular movements intact.     Pupils: Pupils are equal, round, and reactive to light.  Cardiovascular:     Rate and Rhythm: Normal rate and regular rhythm.  Pulmonary:     Effort: Respiratory distress present.     Breath sounds: Wheezing present.  Abdominal:     General: Abdomen is flat. Bowel sounds are normal.     Palpations: Abdomen is soft.  Musculoskeletal:        General: No swelling or tenderness. Normal range of motion.     Cervical back: Normal range of motion.     Right lower leg: No edema.     Left lower leg: No edema.  Skin:    General: Skin is warm and dry.     Capillary Refill: Capillary refill takes less than 2 seconds.  Neurological:     General: No focal deficit present.     Mental Status: He is alert.  Psychiatric:        Mood and Affect: Mood normal.        Behavior: Behavior normal.  ED Results / Procedures / Treatments   Labs (all labs ordered are listed, but only abnormal results are displayed) Labs Reviewed  COMPREHENSIVE METABOLIC PANEL - Abnormal; Notable for the following components:      Result Value   Glucose, Bld 102 (*)    Creatinine, Ser 1.26 (*)    Total Protein 8.2 (*)    Albumin 3.3 (*)    All other components within normal limits  I-STAT ARTERIAL BLOOD GAS, ED - Abnormal; Notable for the following components:   pCO2 arterial 52.7 (*)    pO2, Arterial 45 (*)    Bicarbonate 31.7 (*)    TCO2 33 (*)    Acid-Base Excess 5.0 (*)    All other components within normal limits  RESP PANEL BY RT-PCR (FLU A&B, COVID) ARPGX2  CBC  HIV ANTIBODY (ROUTINE TESTING W REFLEX)    EKG EKG Interpretation  Date/Time:  Friday February 11 2021 07:43:55 EDT Ventricular Rate:  110 PR Interval:  130 QRS Duration: 78 QT Interval:  322 QTC Calculation: 435 R Axis:   87 Text Interpretation: Sinus tachycardia Right  atrial enlargement Borderline ECG Confirmed by Margarita Grizzle [phone]) on 02/11/2021 8:16:14 AM  Radiology DG Chest 2 View  Result Date: 02/11/2021 CLINICAL DATA:  Productive cough for 3 weeks EXAM: CHEST - 2 VIEW COMPARISON:  Chest radiograph 08/24/2017 FINDINGS: The cardiomediastinal silhouette is within normal limits. There are patchy opacities projecting over the spine on the lateral projection which are unchanged since 2018 which may reflect scarring. Slightly increased interstitial markings throughout are unchanged. There is no focal consolidation or pulmonary edema. There is no pleural effusion or pneumothorax. There is no acute osseous abnormality. IMPRESSION: Stable chest with no radiographic evidence of acute cardiopulmonary process. Electronically Signed   By: Lesia Hausen MD   On: 02/11/2021 08:40   CT Angio Chest PE W and/or Wo Contrast  Result Date: 02/11/2021 CLINICAL DATA:  PE suspected.  High probability. EXAM: CT ANGIOGRAPHY CHEST WITH CONTRAST TECHNIQUE: Multidetector CT imaging of the chest was performed using the standard protocol during bolus administration of intravenous contrast. Multiplanar CT image reconstructions and MIPs were obtained to evaluate the vascular anatomy. CONTRAST:  80mL OMNIPAQUE IOHEXOL 350 MG/ML SOLN COMPARISON:  CT chest 06/13/2017. FINDINGS: Cardiovascular: Satisfactory opacification of the pulmonary arteries to the segmental level. No evidence of pulmonary embolism. Normal heart size. No pericardial effusion. Mediastinum/Nodes: No enlarged mediastinal, hilar, or axillary lymph nodes. Thyroid gland, trachea, and esophagus demonstrate no significant findings. Lungs/Pleura: There is diffuse bronchial wall thickening. Bilateral lower lobe predominant cylindrical bronchiectasis is identified. Mucoid impaction within posterior right lower lobe segmental and subsegmental airways noted. There is diffuse bilateral upper and lower lobe central lobular nodularity with  scattered areas of upper lobe predominant ground-glass attenuation. A similar finding was seen on examination from 06/13/2017 but has progressed in the interval. Scattered patchy nodular areas of airspace density are also no including: Subpleural nodular density in the anterior right upper lobe measuring 9 mm, image 52/6. Part solid nodule within the periphery of the left upper lobe measures 7 mm, image 79/6. Subpleural nodule within the posterolateral right lower lobe measures 5 mm, image 125/6. Upper Abdomen: No acute abnormality. Musculoskeletal: No chest wall abnormality. No acute or significant osseous findings. Review of the MIP images confirms the above findings. IMPRESSION: 1. No evidence for acute pulmonary embolus. 2. Diffuse bilateral upper and lower lobe predominant centrilobular nodularity with scattered areas of ground-glass attenuation. There is also mild diffuse cylindrical  type bronchiectasis with areas of lower lobe mucoid impaction. This appears progressive when compared with CT of the chest from 06/13/2017. Imaging findings are favored to represent an chronic progressive atypical inflammatory/infectious process. MAI (Mycobacterium avium intracellulare) is not excluded. Consider referral to pulmonary medicine for further management. 3. A few scattered, nonspecific, non centrilobular nodules are noted bilaterally. Recommend follow-up imaging with repeat CT of the chest in 3-6 months to assess temporal change in the appearance of these nodules. 4. Aortic atherosclerosis. Aortic Atherosclerosis (ICD10-I70.0). Electronically Signed   By: Signa Kell M.D.   On: 02/11/2021 12:35    Procedures Procedures   Medications Ordered in ED Medications  albuterol (PROVENTIL) (2.5 MG/3ML) 0.083% nebulizer solution 2.5 mg (has no administration in time range)  albuterol (VENTOLIN HFA) 108 (90 Base) MCG/ACT inhaler 2 puff (2 puffs Inhalation Given 02/11/21 0746)  methylPREDNISolone sodium succinate  (SOLU-MEDROL) 125 mg/2 mL injection 125 mg (125 mg Intravenous Given 02/11/21 1021)  lactated ringers bolus 500 mL (0 mLs Intravenous Stopped 02/11/21 1117)  albuterol (PROVENTIL) (2.5 MG/3ML) 0.083% nebulizer solution 5 mg (5 mg Nebulization Given 02/11/21 1024)  iohexol (OMNIPAQUE) 350 MG/ML injection 75 mL (65 mLs Intravenous Contrast Given 02/11/21 1158)    ED Course  I have reviewed the triage vital signs and the nursing notes.  Pertinent labs & imaging results that were available during my care of the patient were reviewed by me and considered in my medical decision making (see chart for details). Patient presents with acute dyspnea and abnormalities noted on CT scan Patient previously HIV negative HIV test ordered today.  Differential diagnosis includes Mycobacterium, viral infection, inflammatory lung disease, versus other infections.   MDM Rules/Calculators/A&P                           50 year old man presents today complaining of dyspnea.  On evaluation here he is diffusely wheezing.  SPO2 is 46 on room air.  He oxygenates at 98% saturation on 3 L.  CT scan obtained shows progressive disease from prior with diffuse groundglass nodules and diffuse plugging.  COVID test is negative antibiotics deferred at this time pending consultation with [place] in service.  Care discussed with Dr. Katrinka Blazing he has accepted to his service. Final Clinical Impression(s) / ED Diagnoses Final diagnoses:  Hypoxia  Diffuse lung disease  Wheezing    Rx / DC Orders ED Discharge Orders     None        Margarita Grizzle, MD 02/11/21 1326

## 2021-02-11 NOTE — Progress Notes (Signed)
Tele called and noted ST elevation 3.2, EKG obtained. MD mase aware of elevation and EKG. See new order

## 2021-02-11 NOTE — ED Notes (Signed)
Dr Katrinka Blazing wants to hold off on the blood draws until he has all the blood orders in the chart.

## 2021-02-11 NOTE — Consult Note (Addendum)
NAMEEwel Lona, MRN:  [id number], DOB:  [birth year], LOS: 0 ADMISSION DATE:  02/11/2021, CONSULTATION DATE:  02/11/21 REFERRING MD:  Fuller Plan CHIEF COMPLAINT:  cough and shortness of breath   History of Present Illness:  50yM never smoker from [place] with chronic cough on whom we are asked to consult for abnormal CT Chest. He says he's had a cough for at least 3-4 years maybe longer. It is productive. Fills up half a cup of sputum over the course of the day, yellowish. He takes no inhalers. Over the last 3 weeks devleoped worsening dyspnea, anorexia, weight loss, fatigue. No fever, drenching night sweats.   Father had asthma. No other lung issues in the family.  No other exposures elicited. Moved to [place] 20 years ago, hasn't traveled back any time recently.  Pertinent  Medical History  Never smoker Chronic cough previously followed by Dr. Melvyn Novas Chronic sinusitis   Significant Hospital Events: Including procedures, antibiotic start and stop dates in addition to other pertinent events   8/12 admitted, PCCM consulted  Interim History / Subjective:  N/a  Objective   Blood pressure 124/82, pulse 95, temperature 98.2 F (36.8 C), temperature source Oral, resp. rate (!) 32, SpO2 95 %.        Intake/Output Summary (Last 24 hours) at 02/11/2021 1519 Last data filed at 02/11/2021 1117 Gross per 24 hour  Intake 500 ml  Output --  Net 500 ml   There were no vitals filed for this visit.  Examination: General appearance: thin 50 y.o., male, NAD, conversant  Eyes: moist conjunctivae; no lid-lag; PERRL, tracking appropriately HENT: NCAT; oropharynx, MMM, no mucosal ulcerations; normal hard and soft palate Neck: Trachea midline; no lymphadenopathy, no JVD Lungs: faint inspiratory, expiratory wheeze , rhonchi with normal respiratory effort CV: tachycardic, RR no MRGs  Abdomen: Soft, non-tender; non-distended, BS present  Extremities: No peripheral edema, radial and DP pulses  present bilaterally  Skin: Normal temperature, turgor and texture; no rash Psych: Appropriate affect Neuro: Alert and oriented to person and place, no focal deficit    CTA Chest 8/12 reviewed by me and remarkable for cylindrical bronchiectasis, multiple nodules, TIB, lower lobe predominant mucoid impaction    Resolved Hospital Problem list   N/a  Assessment & Plan:   # Acute on chronic hypercapnic, acute hypoxemic respiratory failure: # Nodular bronchiectasis with mucoid impaction: pattern raises concern for mycobacterial infection whether MTB (would expect him to be a little more toxic appearing) or NTM, ABPA given pt-endorsed asthma/sinusitis and mucoid impaction/bronchial wall thickening although has no eosinophilia (although differential obtained after steroid administration in ED), viral or other atypical infectious process although had similar pattern noted on CT Chest 2018, some other kind of noninfectious bronchiolitis, less likely alternatives include schistosomiasis with pulmonary involvement (parenchymal infection can have this appearance). Doesn't sound like he has any trouble with micro/macro aspiration. Underlying etiology of cylindrical bronchiectasis also unclear unless driven by chronic infection from above.  - although no recent travel to abroad or other exposure, given prior indeterminate quantiferon would rule out transmissible TB with AFB smears x3, airborne, recheck quantiferon - Would check sputum culture, fungal culture, sputum ova/parasites, stool ova/parasites - Would check IgE, IgG subclasses, rheumatoid factor - f/u HIV - if noninvasive workup unrevealing then could pursue bronch/bal on Monday - brovana nebs bid - would schedule duonebs 4 times daily while awake - albuterol nebs prn q4h - start CPT with flutter valve 4 times daily while awake - ok to continue  prednisone 40 mg daily for now given impressive wheeze on exam, possible underlying asthma but I'm also  concerned about the less likely possibility of MTB   [place] (right click and "Reselect all SmartList Selections" daily)   Diet/type: Regular consistency (see orders) DVT prophylaxis: LMWH GI prophylaxis: N/A Lines: N/A Foley:  N/A Code Status:  full code Last date of multidisciplinary goals of care discussion [TBD]  Labs   CBC: Recent Labs  Lab 02/11/21 0748 02/11/21 1146  WBC 8.7  --   HGB 14.6 14.6  HCT 46.5 43.0  MCV 90.6  --   PLT 343  --     Basic Metabolic Panel: Recent Labs  Lab 02/11/21 0748 02/11/21 1146  NA 137 139  K 3.6 3.8  CL 99  --   CO2 30  --   GLUCOSE 102*  --   BUN 19  --   CREATININE 1.26*  --   CALCIUM 9.5  --    GFR: CrCl cannot be calculated (Unknown ideal weight.). Recent Labs  Lab 02/11/21 0748  WBC 8.7    Liver Function Tests: Recent Labs  Lab 02/11/21 0748  AST 29  ALT 22  ALKPHOS 57  BILITOT 0.5  PROT 8.2*  ALBUMIN 3.3*   No results for input(s): LIPASE, AMYLASE in the last 168 hours. No results for input(s): AMMONIA in the last 168 hours.  ABG    Component Value Date/Time   PHART 7.387 02/11/2021 1146   PCO2ART 52.7 (H) 02/11/2021 1146   PO2ART 45 (L) 02/11/2021 1146   HCO3 31.7 (H) 02/11/2021 1146   TCO2 33 (H) 02/11/2021 1146   O2SAT 80.0 02/11/2021 1146     Coagulation Profile: No results for input(s): INR, PROTIME in the last 168 hours.  Cardiac Enzymes: No results for input(s): CKTOTAL, CKMB, CKMBINDEX, TROPONINI in the last 168 hours.  HbA1C: No results found for: HGBA1C  CBG: No results for input(s): GLUCAP in the last 168 hours.  Review of Systems:   Negative except as in HPI.  Past Medical History:  He,  has no past medical history on file.   Surgical History:   Past Surgical History:  Procedure Laterality Date   APPENDECTOMY     LAPAROSCOPIC APPENDECTOMY N/A 04/08/2015   Procedure: APPENDECTOMY LAPAROSCOPIC;  Surgeon:  Excell Seltzer, MD;  Location: [facility];  Service: General;  Laterality: N/A;     Social History:   reports that he has never smoked. He has never used smokeless tobacco. He reports that he does not drink alcohol and does not use drugs.   Family History:  His family history includes Asthma in his father.   Allergies No Known Allergies   Home Medications  Prior to Admission medications   Not on File

## 2021-02-11 NOTE — ED Notes (Signed)
Transported to floor at this time. 

## 2021-02-11 NOTE — ED Triage Notes (Signed)
Pt here from home with c/o sob times 3 weeks , pt has asthma but has not had an inhaler in some time , no vaccines , sats 88-89 % on room air

## 2021-02-11 NOTE — Progress Notes (Signed)
ABG results given to Rosalia Hammers, MD.

## 2021-02-12 DIAGNOSIS — J471 Bronchiectasis with (acute) exacerbation: Secondary | ICD-10-CM

## 2021-02-12 DIAGNOSIS — R634 Abnormal weight loss: Secondary | ICD-10-CM

## 2021-02-12 DIAGNOSIS — R778 Other specified abnormalities of plasma proteins: Secondary | ICD-10-CM

## 2021-02-12 DIAGNOSIS — E876 Hypokalemia: Secondary | ICD-10-CM

## 2021-02-12 LAB — COMPREHENSIVE METABOLIC PANEL
ALT: 21 U/L (ref 0–44)
AST: 21 U/L (ref 15–41)
Albumin: 2.9 g/dL — ABNORMAL LOW (ref 3.5–5.0)
Alkaline Phosphatase: 53 U/L (ref 38–126)
Anion gap: 6 (ref 5–15)
BUN: 15 mg/dL (ref 6–20)
CO2: 32 mmol/L (ref 22–32)
Calcium: 9.3 mg/dL (ref 8.9–10.3)
Chloride: 100 mmol/L (ref 98–111)
Creatinine, Ser: 0.93 mg/dL (ref 0.61–1.24)
GFR, Estimated: >60 mL/min (ref >60)
Glucose, Bld: 138 mg/dL — ABNORMAL HIGH (ref 70–99)
Potassium: 3.4 mmol/L — ABNORMAL LOW (ref 3.5–5.1)
Sodium: 138 mmol/L (ref 135–145)
Total Bilirubin: 0.5 mg/dL (ref 0.3–1.2)
Total Protein: 7.7 g/dL (ref 6.5–8.1)

## 2021-02-12 LAB — RPR: RPR Ser Ql: NONREACTIVE

## 2021-02-12 LAB — PREALBUMIN: Prealbumin: 19.5 mg/dL (ref 18–38)

## 2021-02-12 LAB — RHEUMATOID FACTOR: Rheumatoid fact SerPl-aCnc: 10.5 IU/mL (ref <14.0)

## 2021-02-12 LAB — IGG: IgG (Immunoglobin G), Serum: 2464 mg/dL — ABNORMAL HIGH (ref 603–1613)

## 2021-02-12 LAB — IGG 4: IgG, Subclass 4: 106 mg/dL — ABNORMAL HIGH (ref 2–96)

## 2021-02-12 LAB — MAGNESIUM: Magnesium: 2.2 mg/dL (ref 1.7–2.4)

## 2021-02-12 MED ORDER — SODIUM CHLORIDE 0.9 % IV SOLN
1.0000 g | INTRAVENOUS | Status: DC
  Administered 2021-02-14 – 2021-02-17 (×4): 1 g via INTRAVENOUS
  Filled 2021-02-12 (×7): qty 10

## 2021-02-12 MED ORDER — POTASSIUM CHLORIDE CRYS ER 20 MEQ PO TBCR
40.0000 meq | EXTENDED_RELEASE_TABLET | Freq: Once | ORAL | Status: AC
  Administered 2021-02-12: 40 meq via ORAL
  Filled 2021-02-12: qty 2

## 2021-02-12 MED ORDER — DOXYCYCLINE HYCLATE 100 MG PO TABS
100.0000 mg | ORAL_TABLET | Freq: Two times a day (BID) | ORAL | Status: DC
  Administered 2021-02-12 – 2021-02-18 (×13): 100 mg via ORAL
  Filled 2021-02-12 (×13): qty 1

## 2021-02-12 NOTE — Progress Notes (Signed)
Tele called and informed that patient had an episoded of 3rd degree heart block. Messaged md on-call. Will continue to monitor.

## 2021-02-12 NOTE — Progress Notes (Addendum)
PROGRESS NOTE  Shane Fleming GUY:[id number] DOB: [birth year] DOA: 02/11/2021 PCP: Gildardo Pounds, NP   LOS: 1 day   Brief narrative: Shane Fleming is a 50 y.o. male with medical history of sinusitis presented to the hospital with cough and shortness of breath for 3 weeks with productive sputum wheezing and weight loss of around 32 pounds.  Denied any history of smoking.  He has not traveled recently outside of the [place] and has been living in [place] for 20 years and has history of pansinusitis.  He has been evaluated by ENT and pulmonary in the past.  In the ED, patient was noted to be hypoxic with 87% pulse ox on room air which improved with supplemental oxygen.  Labs were unremarkable.  He was given normal saline IV fluids albuterol and Solu-Medrol and patient was admitted to hospital.    Assessment/Plan:  Principal Problem:   Acute respiratory failure with hypoxia and hypercapnia (HCC) Active Problems:   Bronchiectasis (HCC)   Weight loss   Elevated total protein   Acute respiratory failure with hypoxia and hypercapnia  secondary to bronchiectasis with exacerbation: With productive greenish sputum.  Was initially hypoxic and required supplemental oxygen.  Chest x-ray noting multiple scattered areas of groundglass attenuation bronchiectasis and mucous impaction noted.  Influenza and COVID was negative. Check sputum culture, fungus culture, sputum/stool ova parasite, and AFB smear with reflex culture.  Procalcitonin was less than 0.10.  ESR and CRP mildly elevated. IgE, IgG subclasses pending., rheumatoid factor 10.5.  Continue flutter valve incentive spirometry albuterol Brovana nebs and prednisone.  PCCM on board will follow recommendations  Reported heart block in telemetry..  EKG showed sinus tachycardia.  We will continue to monitor closely.  Replace electrolytes.  Weight loss:  Unintentional due to lack of appetite.  Reported 32 pound weight loss over the last 3 weeks.  TSH  within normal limits.  Diabetes and hepatitis panel negative.  Hypokalemia.  We will replenish.  Check levels in a.m.  Chronic sinusitis.  Continue symptomatic treatment.   DVT prophylaxis: enoxaparin (LOVENOX) injection 40 mg Start: 02/11/21 2000   Code Status: Full code  Family Communication: None  Status is: Inpatient  Remains inpatient appropriate because:Ongoing diagnostic testing needed not appropriate for outpatient work up, IV treatments appropriate due to intensity of illness or inability to take PO, and Inpatient level of care appropriate due to severity of illness  Dispo: The patient is from: Home              Anticipated d/c is to: Home              Patient currently is not medically stable to d/c.   Difficult to place patient No  Consultants: Pulmonary  Procedures: None  Anti-infectives:  None  Anti-infectives (From admission, onward)    None      Subjective: Today, patient was seen and examined at bedside.  Complains of greenish productive sputum.  No chest pain or hemoptysis.  No fever or chills.  Complains of cough day and night.  Objective: Vitals:   02/12/21 0930 02/12/21 1102  BP: 102/73   Pulse:    Resp:    Temp: 98.2 F (36.8 C)   SpO2: 94% 90%   No intake or output data in the 24 hours ending 02/12/21 1128 There were no vitals filed for this visit. There is no height or weight on file to calculate BMI.   Physical Exam: GENERAL: Patient is alert awake and oriented. Not in  obvious distress.  On nasal cannula oxygen, thinly built HENT: No scleral pallor or icterus. Pupils equally reactive to light. Oral mucosa is moist NECK: is supple, no gross swelling noted. CHEST: Coarse breath sounds noted bilaterally.  CVS: S1 and S2 heard, no murmur. Regular rate and rhythm.  ABDOMEN: Soft, non-tender, bowel sounds are present. EXTREMITIES: No edema. CNS: Cranial nerves are intact. No focal motor deficits. SKIN: warm and dry without  rashes.  Data Review: I have personally reviewed the following laboratory data and studies,  CBC: Recent Labs  Lab 02/11/21 0748 02/11/21 1146 02/11/21 1530  WBC 8.7  --  6.8  NEUTROABS  --   --  6.4  HGB 14.6 14.6 14.2  HCT 46.5 43.0 44.3  MCV 90.6  --  89.0  PLT 343  --  993   Basic Metabolic Panel: Recent Labs  Lab 02/11/21 0748 02/11/21 1146 02/12/21 0230  NA 137 139 138  K 3.6 3.8 3.4*  CL 99  --  100  CO2 30  --  32  GLUCOSE 102*  --  138*  BUN 19  --  15  CREATININE 1.26*  --  0.93  CALCIUM 9.5  --  9.3   Liver Function Tests: Recent Labs  Lab 02/11/21 0748 02/12/21 0230  AST 29 21  ALT 22 21  ALKPHOS 57 53  BILITOT 0.5 0.5  PROT 8.2* 7.7  ALBUMIN 3.3* 2.9*   No results for input(s): LIPASE, AMYLASE in the last 168 hours. No results for input(s): AMMONIA in the last 168 hours. Cardiac Enzymes: No results for input(s): CKTOTAL, CKMB, CKMBINDEX, TROPONINI in the last 168 hours. BNP (last 3 results) No results for input(s): BNP in the last 8760 hours.  ProBNP (last 3 results) No results for input(s): PROBNP in the last 8760 hours.  CBG: No results for input(s): GLUCAP in the last 168 hours. Recent Results (from the past 240 hour(s))  Resp Panel by RT-PCR (Flu A&B, Covid) Nasopharyngeal Swab     Status: None   Collection Time: 02/11/21  7:43 AM   Specimen: Nasopharyngeal Swab; Nasopharyngeal(NP) swabs in vial transport medium  Result Value Ref Range Status   SARS Coronavirus 2 by RT PCR NEGATIVE NEGATIVE Final    Comment: (NOTE) SARS-CoV-2 target nucleic acids are NOT DETECTED.  The SARS-CoV-2 RNA is generally detectable in upper respiratory specimens during the acute phase of infection. The lowest concentration of SARS-CoV-2 viral copies this assay can detect is 138 copies/mL. A negative result does not preclude SARS-Cov-2 infection and should not be used as the sole basis for treatment or other patient management decisions. A negative result  may occur with  improper specimen collection/handling, submission of specimen other than nasopharyngeal swab, presence of viral mutation(s) within the areas targeted by this assay, and inadequate number of viral copies(<138 copies/mL). A negative result must be combined with clinical observations, patient history, and epidemiological information. The expected result is Negative.  Fact Sheet for Patients:  EntrepreneurPulse.com.au  Fact Sheet for Healthcare Providers:  IncredibleEmployment.be  This test is no t yet approved or cleared by the [place] FDA and  has been authorized for detection and/or diagnosis of SARS-CoV-2 by FDA under an Emergency Use Authorization (EUA). This EUA will remain  in effect (meaning this test can be used) for the duration of the COVID-19 declaration under Section 564(b)(1) of the Act, 21 U.S.C.section 360bbb-3(b)(1), unless the authorization is terminated  or revoked sooner.       Influenza A  by PCR NEGATIVE NEGATIVE Final   Influenza B by PCR NEGATIVE NEGATIVE Final    Comment: (NOTE) The Xpert Xpress SARS-CoV-2/FLU/RSV plus assay is intended as an aid in the diagnosis of influenza from Nasopharyngeal swab specimens and should not be used as a sole basis for treatment. Nasal washings and aspirates are unacceptable for Xpert Xpress SARS-CoV-2/FLU/RSV testing.  Fact Sheet for Patients: EntrepreneurPulse.com.au  Fact Sheet for Healthcare Providers: IncredibleEmployment.be  This test is not yet approved or cleared by the [place] FDA and has been authorized for detection and/or diagnosis of SARS-CoV-2 by FDA under an Emergency Use Authorization (EUA). This EUA will remain in effect (meaning this test can be used) for the duration of the COVID-19 declaration under Section 564(b)(1) of the Act, 21 U.S.C. section 360bbb-3(b)(1), unless the authorization is terminated  or revoked.  Performed at [facility], [place] [address], [place], [place] [address]      Studies: DG Chest 2 View  Result Date: 02/11/2021 CLINICAL DATA:  Productive cough for 3 weeks EXAM: CHEST - 2 VIEW COMPARISON:  Chest radiograph 08/24/2017 FINDINGS: The cardiomediastinal silhouette is within normal limits. There are patchy opacities projecting over the spine on the lateral projection which are unchanged since 2018 which may reflect scarring. Slightly increased interstitial markings throughout are unchanged. There is no focal consolidation or pulmonary edema. There is no pleural effusion or pneumothorax. There is no acute osseous abnormality. IMPRESSION: Stable chest with no radiographic evidence of acute cardiopulmonary process. Electronically Signed   By: Valetta Mole MD   On: 02/11/2021 08:40   CT Angio Chest PE W and/or Wo Contrast  Result Date: 02/11/2021 CLINICAL DATA:  PE suspected.  High probability. EXAM: CT ANGIOGRAPHY CHEST WITH CONTRAST TECHNIQUE: Multidetector CT imaging of the chest was performed using the standard protocol during bolus administration of intravenous contrast. Multiplanar CT image reconstructions and MIPs were obtained to evaluate the vascular anatomy. CONTRAST:  51m OMNIPAQUE IOHEXOL 350 MG/ML SOLN COMPARISON:  CT chest 06/13/2017. FINDINGS: Cardiovascular: Satisfactory opacification of the pulmonary arteries to the segmental level. No evidence of pulmonary embolism. Normal heart size. No pericardial effusion. Mediastinum/Nodes: No enlarged mediastinal, hilar, or axillary lymph nodes. Thyroid gland, trachea, and esophagus demonstrate no significant findings. Lungs/Pleura: There is diffuse bronchial wall thickening. Bilateral lower lobe predominant cylindrical bronchiectasis is identified. Mucoid impaction within posterior right lower lobe segmental and subsegmental airways noted. There is diffuse bilateral upper and lower lobe central lobular nodularity with  scattered areas of upper lobe predominant ground-glass attenuation. A similar finding was seen on examination from 06/13/2017 but has progressed in the interval. Scattered patchy nodular areas of airspace density are also no including: Subpleural nodular density in the anterior right upper lobe measuring 9 mm, image 52/6. Part solid nodule within the periphery of the left upper lobe measures 7 mm, image 79/6. Subpleural nodule within the posterolateral right lower lobe measures 5 mm, image 125/6. Upper Abdomen: No acute abnormality. Musculoskeletal: No chest wall abnormality. No acute or significant osseous findings. Review of the MIP images confirms the above findings. IMPRESSION: 1. No evidence for acute pulmonary embolus. 2. Diffuse bilateral upper and lower lobe predominant centrilobular nodularity with scattered areas of ground-glass attenuation. There is also mild diffuse cylindrical type bronchiectasis with areas of lower lobe mucoid impaction. This appears progressive when compared with CT of the chest from 06/13/2017. Imaging findings are favored to represent an chronic progressive atypical inflammatory/infectious process. MAI (Mycobacterium avium intracellulare) is not excluded. Consider referral to  pulmonary medicine for further management. 3. A few scattered, nonspecific, non centrilobular nodules are noted bilaterally. Recommend follow-up imaging with repeat CT of the chest in 3-6 months to assess temporal change in the appearance of these nodules. 4. Aortic atherosclerosis. Aortic Atherosclerosis (ICD10-I70.0). Electronically Signed   By: Kerby Moors M.D.   On: 02/11/2021 12:35      Flora Lipps, MD  [facility] 02/12/2021  If 7PM-7AM, please contact night-coverage

## 2021-02-12 NOTE — Consult Note (Signed)
NAMEBrysen Fleming, MRN:  [id number], DOB:  11-13-1970, LOS: 1 ADMISSION DATE:  02/11/2021, CONSULTATION DATE:  02/11/21 REFERRING MD:  Madelyn Flavors CHIEF COMPLAINT:  cough and shortness of breath   History of Present Illness:  50yM never smoker from [place] with chronic cough on whom we are asked to consult for abnormal CT Chest. He says he's had a cough for at least 3-4 years maybe longer. It is productive. Fills up half a cup of sputum over the course of the day, yellowish. He takes no inhalers. Over the last 3 weeks devleoped worsening dyspnea, anorexia, weight loss, fatigue. No fever, drenching night sweats.   Father had asthma. No other lung issues in the family.  No other exposures elicited. Moved to [place] 20 years ago, hasn't traveled back any time recently.  Pertinent  Medical History  Never smoker Chronic cough previously followed by Dr. Sherene Sires Chronic sinusitis   Significant Hospital Events: Including procedures, antibiotic start and stop dates in addition to other pertinent events   8/12 admitted, PCCM consulted  Interim History / Subjective:  N/a  Objective   Blood pressure 102/73, pulse 96, temperature 98.2 F (36.8 C), temperature source Oral, resp. rate 16, SpO2 94 %.        Intake/Output Summary (Last 24 hours) at 02/12/2021 1017 Last data filed at 02/11/2021 1117 Gross per 24 hour  Intake 500 ml  Output --  Net 500 ml   There were no vitals filed for this visit.  Examination: General: Thin African male no acute distress HEENT: No JVD lymphadenopathy is appreciated Neuro: Grossly intact without focal defect CV: Heart sounds are distant PULM:   no auditory wheeze Gi soft, bsx4 active  GU: Extremities: warm/dry, negative edema  Skin: no rashes or lesions   CTA Chest 8/12 reviewed by me and remarkable for cylindrical bronchiectasis, multiple nodules, TIB, lower lobe predominant mucoid impaction    Resolved Hospital Problem list   N/a  Assessment &  Plan:   # Acute on chronic hypercapnic, acute hypoxemic respiratory failure: # Nodular bronchiectasis with mucoid impaction: pattern raises concern for mycobacterial infection whether MTB (would expect him to be a little more toxic appearing) or NTM, ABPA given pt-endorsed asthma/sinusitis and mucoid impaction/bronchial wall thickening although has no eosinophilia (although differential obtained after steroid administration in ED), viral or other atypical infectious process although had similar pattern noted on CT Chest 2018, some other kind of noninfectious bronchiolitis, less likely alternatives include schistosomiasis with pulmonary involvement (parenchymal infection can have this appearance). Doesn't sound like he has any trouble with micro/macro aspiration. Underlying etiology of cylindrical bronchiectasis also unclear unless driven by chronic infection from above.  02/12/2021 no positive culture data HIV is nonreactive [place] gold TB Gold plus was indeterminate 3 years ago Decrease FiO2 as tolerated currently on 1 L with sats 92% Questionable fiberoptic bronchoscopy in the near future Pulmonary toilet P.o. prednisone Pulmonary critical care will see again on Monday         Best Practice (right click and "Reselect all SmartList Selections" daily)   Diet/type: Regular consistency (see orders) DVT prophylaxis: LMWH GI prophylaxis: N/A Lines: N/A Foley:  N/A Code Status:  full code Last date of multidisciplinary goals of care discussion [TBD]  Labs   CBC: Recent Labs  Lab 02/11/21 0748 02/11/21 1146 02/11/21 1530  WBC 8.7  --  6.8  NEUTROABS  --   --  6.4  HGB 14.6 14.6 14.2  HCT 46.5 43.0 44.3  MCV  90.6  --  89.0  PLT 343  --  352    Basic Metabolic Panel: Recent Labs  Lab 02/11/21 0748 02/11/21 1146 02/12/21 0230  NA 137 139 138  K 3.6 3.8 3.4*  CL 99  --  100  CO2 30  --  32  GLUCOSE 102*  --  138*  BUN 19  --  15  CREATININE 1.26*  --  0.93  CALCIUM 9.5   --  9.3   GFR: CrCl cannot be calculated (Unknown ideal weight.). Recent Labs  Lab 02/11/21 0748 02/11/21 1530  PROCALCITON  --  <0.10  WBC 8.7 6.8    Liver Function Tests: Recent Labs  Lab 02/11/21 0748 02/12/21 0230  AST 29 21  ALT 22 21  ALKPHOS 57 53  BILITOT 0.5 0.5  PROT 8.2* 7.7  ALBUMIN 3.3* 2.9*   No results for input(s): LIPASE, AMYLASE in the last 168 hours. No results for input(s): AMMONIA in the last 168 hours.  ABG    Component Value Date/Time   PHART 7.387 02/11/2021 1146   PCO2ART 52.7 (H) 02/11/2021 1146   PO2ART 45 (L) 02/11/2021 1146   HCO3 31.7 (H) 02/11/2021 1146   TCO2 33 (H) 02/11/2021 1146   O2SAT 80.0 02/11/2021 1146     Coagulation Profile: No results for input(s): INR, PROTIME in the last 168 hours.  Cardiac Enzymes: No results for input(s): CKTOTAL, CKMB, CKMBINDEX, TROPONINI in the last 168 hours.  HbA1C: No results found for: HGBA1C  CBG: No results for input(s): GLUCAP in the last 168 hours.  Not on File     Steve Karynn Deblasi ACNP Acute Care Nurse Practitioner [facility] Pulmonary/Critical Care Please consult Amion 02/12/2021, 10:17 AM

## 2021-02-13 LAB — CBC
HCT: 42.1 % (ref 39.0–52.0)
Hemoglobin: 13.6 g/dL (ref 13.0–17.0)
MCH: 28.9 pg (ref 26.0–34.0)
MCHC: 32.3 g/dL (ref 30.0–36.0)
MCV: 89.4 fL (ref 80.0–100.0)
Platelets: 303 10*3/uL (ref 150–400)
RBC: 4.71 MIL/uL (ref 4.22–5.81)
RDW: 13.6 % (ref 11.5–15.5)
WBC: 10.7 10*3/uL — ABNORMAL HIGH (ref 4.0–10.5)
nRBC: 0 % (ref 0.0–0.2)

## 2021-02-13 LAB — MAGNESIUM: Magnesium: 1.9 mg/dL (ref 1.7–2.4)

## 2021-02-13 LAB — BASIC METABOLIC PANEL
Anion gap: 8 (ref 5–15)
BUN: 12 mg/dL (ref 6–20)
CO2: 28 mmol/L (ref 22–32)
Calcium: 9.3 mg/dL (ref 8.9–10.3)
Chloride: 99 mmol/L (ref 98–111)
Creatinine, Ser: 0.83 mg/dL (ref 0.61–1.24)
GFR, Estimated: >60 mL/min (ref >60)
Glucose, Bld: 93 mg/dL (ref 70–99)
Potassium: 4.2 mmol/L (ref 3.5–5.1)
Sodium: 135 mmol/L (ref 135–145)

## 2021-02-13 LAB — PHOSPHORUS: Phosphorus: 3.9 mg/dL (ref 2.5–4.6)

## 2021-02-13 MED ORDER — GUAIFENESIN-DM 100-10 MG/5ML PO SYRP
5.0000 mL | ORAL_SOLUTION | ORAL | Status: DC | PRN
  Administered 2021-02-13 – 2021-02-17 (×4): 5 mL via ORAL
  Filled 2021-02-13: qty 10
  Filled 2021-02-13: qty 5
  Filled 2021-02-13: qty 10
  Filled 2021-02-13: qty 5

## 2021-02-13 NOTE — Progress Notes (Signed)
PROGRESS NOTE  Shane Fleming HWE:[id number] DOB: 10/22/1970 DOA: 02/11/2021 PCP: Gildardo Pounds, NP   LOS: 2 days   Brief narrative: Shane Fleming is a 50 y.o. male with medical history of sinusitis presented to the hospital with cough and shortness of breath for 3 weeks with productive sputum wheezing and weight loss of around 32 pounds.  Denied any history of smoking.  He has not traveled recently outside of the [place] and has been living in [place] for 20 years and has history of pansinusitis.  He has been evaluated by ENT and pulmonary in the past.  In the ED, patient was noted to be hypoxic with 87% pulse ox on room air which improved with supplemental oxygen.  Labs were unremarkable.  He was given normal saline IV fluids albuterol and Solu-Medrol and patient was admitted to hospital.     Assessment/Plan:  Principal Problem:   Acute respiratory failure with hypoxia and hypercapnia (HCC) Active Problems:   Bronchiectasis (HCC)   Weight loss   Elevated total protein   Acute respiratory failure with hypoxia and hypercapnia  secondary to bronchiectasis with exacerbation: With productive greenish sputum.  Was initially hypoxic and required supplemental oxygen.  Chest x-ray noting multiple scattered areas of groundglass attenuation bronchiectasis and mucous impaction noted.  Influenza and COVID was negative. Check sputum culture, fungus culture, sputum/stool ova parasite, and AFB smear with reflex culture.  Procalcitonin was less than 0.10.  ESR and CRP mildly elevated. Rheumatoid factor 10.5.  Continue flutter valve incentive spirometry albuterol Brovana nebs and prednisone.  PCCM on board and will continue to follow recommendations.  Still complains of intractable cough.  We will add Robitussin-DM.  Follow AFB, sputum cultures.  Pulmonary recommending prednisone for 2 weeks.  Continue oral doxycycline and Rocephin..  Reported heart block in telemetry.  EKG showed sinus tachycardia.  We  will continue to monitor closely.  Replace electrolytes.  No further report.  Lites within normal limits.  Weight loss:  Unintentional due to lack of appetite.  Reported 32 pound weight loss over the last 3 weeks.  Could be secondary to bronchiectasis.  TSH within normal limits. hepatitis panel negative.  Hypokalemia.  Improved after replacement.  Potassium of 4.2 today.  Chronic sinusitis.  Continue symptomatic treatment.   DVT prophylaxis: enoxaparin (LOVENOX) injection 40 mg Start: 02/11/21 2000   Code Status: Full code  Family Communication: None  Status is: Inpatient  Remains inpatient appropriate because:Ongoing diagnostic testing needed not appropriate for outpatient work up, IV treatments appropriate due to intensity of illness or inability to take PO, and Inpatient level of care appropriate due to severity of illness  Dispo: The patient is from: Home              Anticipated d/c is to: Home              Patient currently is not medically stable to d/c.   Difficult to place patient No  Consultants: Pulmonary  Procedures: None  Anti-infectives:  Doxycycline and Rocephin.  Anti-infectives (From admission, onward)    Start     Dose/Rate Route Frequency Ordered Stop   02/12/21 1415  cefTRIAXone (ROCEPHIN) 1 g in sodium chloride 0.9 % 100 mL IVPB        1 g 200 mL/hr over 30 Minutes Intravenous Every 24 hours 02/12/21 1319     02/12/21 1415  doxycycline (VIBRA-TABS) tablet 100 mg        100 mg Oral Every 12 hours 02/12/21 1319  Subjective: Today, patient was seen and examined at bedside.  Feels short of breath on minimal exertion.  Continues to have productive cough.  Objective: Vitals:   02/13/21 0734 02/13/21 0940  BP:  116/81  Pulse: 89   Resp:  (!) 28  Temp:  97.6 F (36.4 C)  SpO2: 96% 94%    Intake/Output Summary (Last 24 hours) at 02/13/2021 1144 Last data filed at 02/13/2021 0646 Gross per 24 hour  Intake 820 ml  Output --  Net 820 ml    There were no vitals filed for this visit. There is no height or weight on file to calculate BMI.   Physical Exam:  GENERAL: Patient is alert awake and oriented. Not in obvious distress.  Thinly built, on nasal cannula oxygen at 1 L/min.   HENT: No scleral pallor or icterus. Pupils equally reactive to light. Oral mucosa is moist NECK: is supple, no gross swelling noted. CHEST: Coarse breath sounds bilaterally with wheezing.   CVS: S1 and S2 heard, no murmur. Regular rate and rhythm.  ABDOMEN: Soft, non-tender, bowel sounds are present. EXTREMITIES: No edema. CNS: Cranial nerves are intact. No focal motor deficits. SKIN: warm and dry without rashes.  Data Review: I have personally reviewed the following laboratory data and studies,  CBC: Recent Labs  Lab 02/11/21 0748 02/11/21 1146 02/11/21 1530 02/13/21 0315  WBC 8.7  --  6.8 10.7*  NEUTROABS  --   --  6.4  --   HGB 14.6 14.6 14.2 13.6  HCT 46.5 43.0 44.3 42.1  MCV 90.6  --  89.0 89.4  PLT 343  --  352 101    Basic Metabolic Panel: Recent Labs  Lab 02/11/21 0748 02/11/21 1146 02/12/21 0230 02/13/21 0315  NA 137 139 138 135  K 3.6 3.8 3.4* 4.2  CL 99  --  100 99  CO2 30  --  32 28  GLUCOSE 102*  --  138* 93  BUN 19  --  15 12  CREATININE 1.26*  --  0.93 0.83  CALCIUM 9.5  --  9.3 9.3  MG  --   --  2.2 1.9  PHOS  --   --   --  3.9    Liver Function Tests: Recent Labs  Lab 02/11/21 0748 02/12/21 0230  AST 29 21  ALT 22 21  ALKPHOS 57 53  BILITOT 0.5 0.5  PROT 8.2* 7.7  ALBUMIN 3.3* 2.9*    No results for input(s): LIPASE, AMYLASE in the last 168 hours. No results for input(s): AMMONIA in the last 168 hours. Cardiac Enzymes: No results for input(s): CKTOTAL, CKMB, CKMBINDEX, TROPONINI in the last 168 hours. BNP (last 3 results) No results for input(s): BNP in the last 8760 hours.  ProBNP (last 3 results) No results for input(s): PROBNP in the last 8760 hours.  CBG: No results for input(s):  GLUCAP in the last 168 hours. Recent Results (from the past 240 hour(s))  Resp Panel by RT-PCR (Flu A&B, Covid) Nasopharyngeal Swab     Status: None   Collection Time: 02/11/21  7:43 AM   Specimen: Nasopharyngeal Swab; Nasopharyngeal(NP) swabs in vial transport medium  Result Value Ref Range Status   SARS Coronavirus 2 by RT PCR NEGATIVE NEGATIVE Final    Comment: (NOTE) SARS-CoV-2 target nucleic acids are NOT DETECTED.  The SARS-CoV-2 RNA is generally detectable in upper respiratory specimens during the acute phase of infection. The lowest concentration of SARS-CoV-2 viral copies this assay can detect is  138 copies/mL. A negative result does not preclude SARS-Cov-2 infection and should not be used as the sole basis for treatment or other patient management decisions. A negative result may occur with  improper specimen collection/handling, submission of specimen other than nasopharyngeal swab, presence of viral mutation(s) within the areas targeted by this assay, and inadequate number of viral copies(<138 copies/mL). A negative result must be combined with clinical observations, patient history, and epidemiological information. The expected result is Negative.  Fact Sheet for Patients:  EntrepreneurPulse.com.au  Fact Sheet for Healthcare Providers:  IncredibleEmployment.be  This test is no t yet approved or cleared by the [place] FDA and  has been authorized for detection and/or diagnosis of SARS-CoV-2 by FDA under an Emergency Use Authorization (EUA). This EUA will remain  in effect (meaning this test can be used) for the duration of the COVID-19 declaration under Section 564(b)(1) of the Act, 21 U.S.C.section 360bbb-3(b)(1), unless the authorization is terminated  or revoked sooner.       Influenza A by PCR NEGATIVE NEGATIVE Final   Influenza B by PCR NEGATIVE NEGATIVE Final    Comment: (NOTE) The Xpert Xpress SARS-CoV-2/FLU/RSV  plus assay is intended as an aid in the diagnosis of influenza from Nasopharyngeal swab specimens and should not be used as a sole basis for treatment. Nasal washings and aspirates are unacceptable for Xpert Xpress SARS-CoV-2/FLU/RSV testing.  Fact Sheet for Patients: EntrepreneurPulse.com.au  Fact Sheet for Healthcare Providers: IncredibleEmployment.be  This test is not yet approved or cleared by the [place] FDA and has been authorized for detection and/or diagnosis of SARS-CoV-2 by FDA under an Emergency Use Authorization (EUA). This EUA will remain in effect (meaning this test can be used) for the duration of the COVID-19 declaration under Section 564(b)(1) of the Act, 21 U.S.C. section 360bbb-3(b)(1), unless the authorization is terminated or revoked.  Performed at [facility], [place] [address] [place], [place] [address]       Studies: CT Angio Chest PE W and/or Wo Contrast  Result Date: 02/11/2021 CLINICAL DATA:  PE suspected.  High probability. EXAM: CT ANGIOGRAPHY CHEST WITH CONTRAST TECHNIQUE: Multidetector CT imaging of the chest was performed using the standard protocol during bolus administration of intravenous contrast. Multiplanar CT image reconstructions and MIPs were obtained to evaluate the vascular anatomy. CONTRAST:  66mL OMNIPAQUE IOHEXOL 350 MG/ML SOLN COMPARISON:  CT chest 06/13/2017. FINDINGS: Cardiovascular: Satisfactory opacification of the pulmonary arteries to the segmental level. No evidence of pulmonary embolism. Normal heart size. No pericardial effusion. Mediastinum/Nodes: No enlarged mediastinal, hilar, or axillary lymph nodes. Thyroid gland, trachea, and esophagus demonstrate no significant findings. Lungs/Pleura: There is diffuse bronchial wall thickening. Bilateral lower lobe predominant cylindrical bronchiectasis is identified. Mucoid impaction within posterior right lower lobe segmental and subsegmental  airways noted. There is diffuse bilateral upper and lower lobe central lobular nodularity with scattered areas of upper lobe predominant ground-glass attenuation. A similar finding was seen on examination from 06/13/2017 but has progressed in the interval. Scattered patchy nodular areas of airspace density are also no including: Subpleural nodular density in the anterior right upper lobe measuring 9 mm, image 52/6. Part solid nodule within the periphery of the left upper lobe measures 7 mm, image 79/6. Subpleural nodule within the posterolateral right lower lobe measures 5 mm, image 125/6. Upper Abdomen: No acute abnormality. Musculoskeletal: No chest wall abnormality. No acute or significant osseous findings. Review of the MIP images confirms the above findings. IMPRESSION: 1. No evidence for acute pulmonary  embolus. 2. Diffuse bilateral upper and lower lobe predominant centrilobular nodularity with scattered areas of ground-glass attenuation. There is also mild diffuse cylindrical type bronchiectasis with areas of lower lobe mucoid impaction. This appears progressive when compared with CT of the chest from 06/13/2017. Imaging findings are favored to represent an chronic progressive atypical inflammatory/infectious process. MAI (Mycobacterium avium intracellulare) is not excluded. Consider referral to pulmonary medicine for further management. 3. A few scattered, nonspecific, non centrilobular nodules are noted bilaterally. Recommend follow-up imaging with repeat CT of the chest in 3-6 months to assess temporal change in the appearance of these nodules. 4. Aortic atherosclerosis. Aortic Atherosclerosis (ICD10-I70.0). Electronically Signed   By: Kerby Moors M.D.   On: 02/11/2021 12:35      Flora Lipps, MD  [facility] 02/13/2021  If 7PM-7AM, please contact night-coverage

## 2021-02-14 LAB — BASIC METABOLIC PANEL
Anion gap: 7 (ref 5–15)
BUN: 13 mg/dL (ref 6–20)
CO2: 32 mmol/L (ref 22–32)
Calcium: 9.2 mg/dL (ref 8.9–10.3)
Chloride: 99 mmol/L (ref 98–111)
Creatinine, Ser: 0.97 mg/dL (ref 0.61–1.24)
GFR, Estimated: >60 mL/min (ref >60)
Glucose, Bld: 98 mg/dL (ref 70–99)
Potassium: 3.8 mmol/L (ref 3.5–5.1)
Sodium: 138 mmol/L (ref 135–145)

## 2021-02-14 LAB — RESPIRATORY PANEL BY PCR

## 2021-02-14 LAB — CBC
HCT: 43.2 % (ref 39.0–52.0)
Hemoglobin: 13.6 g/dL (ref 13.0–17.0)
MCH: 28.2 pg (ref 26.0–34.0)
MCHC: 31.5 g/dL (ref 30.0–36.0)
MCV: 89.6 fL (ref 80.0–100.0)
Platelets: 342 10*3/uL (ref 150–400)
RBC: 4.82 MIL/uL (ref 4.22–5.81)
RDW: 13.6 % (ref 11.5–15.5)
WBC: 7.9 10*3/uL (ref 4.0–10.5)
nRBC: 0 % (ref 0.0–0.2)

## 2021-02-14 LAB — EXPECTORATED SPUTUM ASSESSMENT W GRAM STAIN, RFLX TO RESP C

## 2021-02-14 LAB — MAGNESIUM: Magnesium: 2 mg/dL (ref 1.7–2.4)

## 2021-02-14 MED ORDER — REVEFENACIN 175 MCG/3ML IN SOLN
175.0000 ug | Freq: Every day | RESPIRATORY_TRACT | Status: DC
  Administered 2021-02-14 – 2021-02-18 (×4): 175 ug via RESPIRATORY_TRACT
  Filled 2021-02-14 (×5): qty 3

## 2021-02-14 NOTE — Progress Notes (Signed)
PROGRESS NOTE  Shane Fleming TMH:[id number] DOB: 1971/06/07 DOA: 02/11/2021 PCP: Shane Pounds, NP   LOS: 3 days   Brief narrative: Shane Fleming is a 50 y.o. male with medical history of sinusitis presented to the hospital with cough and shortness of breath for 3 weeks with productive sputum, wheezing and weight loss of around 32 Fleming.  Denied any history of smoking.  Patient has not traveled recently outside of the [place] and has been living in [place] for 20 years and has history of pansinusitis.  He has been evaluated by ENT and pulmonary in the past.  In the ED, patient was noted to be hypoxic with 87% pulse ox on room air which improved with supplemental oxygen.  Labs were unremarkable.  He was given normal saline, IV fluids, albuterol, Solu-Medrol and patient was admitted to hospital.    During hospitalization, patient was given a antibiotics and steroids and pulmonary consultation was made.  He was subsequently put on airborne precautions and AFB sputum will be sent today.   Assessment/Plan:  Principal Problem:   Acute respiratory failure with hypoxia and hypercapnia (HCC) Active Problems:   Bronchiectasis (HCC)   Weight loss   Elevated total protein  Acute respiratory failure with hypoxia and hypercapnia  Likely secondary to bronchiectasis with exacerbation: With productive greenish sputum.  Patient was initially hypoxic and required supplemental oxygen.  Chest x-ray showed multiple scattered areas of groundglass attenuation bronchiectasis and mucous impaction noted.  Influenza and COVID was negative.  Seen by pulmonary today who recommended airborne precautions and AFB stain and culture.  Procalcitonin was less than 0.10.  ESR and CRP mildly elevated. Rheumatoid factor 10.5.  Continue flutter valve, incentive spirometry albuterol, Brovana nebs and prednisone.  PCCM on board and will continue to follow recommendations.  Still complains of cough.   Pulmonary recommending  prednisone for 2 weeks.  Continue oral doxycycline and Rocephin.  Reported heart block in telemetry.  Maintain electrolytes no further episodes..  Weight loss:  Unintentional due to lack of appetite.  Reported 32 pound weight loss over the last 3 weeks.  Could be secondary to bronchiectasis.  TSH within normal limits. hepatitis panel negative.  Magnesium of 2.0.  Potassium of 3.8.  Hypokalemia.  improved after repletion.  Chronic sinusitis.  Continue symptomatic treatment.   DVT prophylaxis: enoxaparin (LOVENOX) injection 40 mg Start: 02/11/21 2000   Code Status: Full code  Family Communication: None  Status is: Inpatient  Remains inpatient appropriate because:Ongoing diagnostic testing needed not appropriate for outpatient work up, IV treatments appropriate due to intensity of illness or inability to take PO, and Inpatient level of care appropriate due to severity of illness  Dispo: The patient is from: Home              Anticipated d/c is to: Home              Patient currently is not medically stable to d/c.   Difficult to place patient No  Consultants: Pulmonary  Procedures: None  Anti-infectives:  Doxycycline and Rocephin.  Anti-infectives (From admission, onward)    Start     Dose/Rate Route Frequency Ordered Stop   02/12/21 1415  cefTRIAXone (ROCEPHIN) 1 g in sodium chloride 0.9 % 100 mL IVPB        1 g 200 mL/hr over 30 Minutes Intravenous Every 24 hours 02/12/21 1319     02/12/21 1415  doxycycline (VIBRA-TABS) tablet 100 mg        100 mg Oral  Every 12 hours 02/12/21 1319        Subjective: Today, patient was seen and examined at bedside.  Still complains of a lot of cough and dyspnea and exertion.  Denies any pain.  Objective: Vitals:   02/13/21 2100 02/14/21 0815  BP: 114/77 110/77  Pulse: (!) 102 (!) 102  Resp:  (!) 22  Temp: (!) 97.4 F (36.3 C) 99.3 F (37.4 C)  SpO2: 95% 96%   No intake or output data in the 24 hours ending 02/14/21  1334  There were no vitals filed for this visit. There is no height or weight on file to calculate BMI.   Physical Exam:  General: Patient is alert awake and oriented, thinly built, on nasal cannula oxygen 1 L/min, not in obvious distress HENT:   No scleral pallor or icterus noted. Oral mucosa is moist.  Chest:   Diminished breath sounds bilaterally.  Wheezes and crackles noted. CVS: S1 &S2 heard. No murmur.  Regular rate and rhythm. Abdomen: Soft, nontender, nondistended.  Bowel sounds are heard.   Extremities: No cyanosis, clubbing or edema.  Peripheral pulses are palpable. Psych: Alert, awake and oriented, normal mood CNS:  No cranial nerve deficits.  Power equal in all extremities.   Skin: Warm and dry.  No rashes noted.  Data Review: I have personally reviewed the following laboratory data and studies,  CBC: Recent Labs  Lab 02/11/21 0748 02/11/21 1146 02/11/21 1530 02/13/21 0315 02/14/21 0532  WBC 8.7  --  6.8 10.7* 7.9  NEUTROABS  --   --  6.4  --   --   HGB 14.6 14.6 14.2 13.6 13.6  HCT 46.5 43.0 44.3 42.1 43.2  MCV 90.6  --  89.0 89.4 89.6  PLT 343  --  352 303 811    Basic Metabolic Panel: Recent Labs  Lab 02/11/21 0748 02/11/21 1146 02/12/21 0230 02/13/21 0315 02/14/21 0532  NA 137 139 138 135 138  K 3.6 3.8 3.4* 4.2 3.8  CL 99  --  100 99 99  CO2 30  --  32 28 32  GLUCOSE 102*  --  138* 93 98  BUN 19  --  _0 CREATININE 1.26*  --  0.93 0.83 0.97  CALCIUM 9.5  --  9.3 9.3 9.2  MG  --   --  2.2 1.9 2.0  PHOS  --   --   --  3.9  --     Liver Function Tests: Recent Labs  Lab 02/11/21 0748 02/12/21 0230  AST 29 21  ALT 22 21  ALKPHOS 57 53  BILITOT 0.5 0.5  PROT 8.2* 7.7  ALBUMIN 3.3* 2.9*    No results for input(s): LIPASE, AMYLASE in the last 168 hours. No results for input(s): AMMONIA in the last 168 hours. Cardiac Enzymes: No results for input(s): CKTOTAL, CKMB, CKMBINDEX, TROPONINI in the last 168 hours. BNP (last 3  results) No results for input(s): BNP in the last 8760 hours.  ProBNP (last 3 results) No results for input(s): PROBNP in the last 8760 hours.  CBG: No results for input(s): GLUCAP in the last 168 hours. Recent Results (from the past 240 hour(s))  Resp Panel by RT-PCR (Flu A&B, Covid) Nasopharyngeal Swab     Status: None   Collection Time: 02/11/21  7:43 AM   Specimen: Nasopharyngeal Swab; Nasopharyngeal(NP) swabs in vial transport medium  Result Value Ref Range Status   SARS Coronavirus 2 by RT PCR NEGATIVE NEGATIVE Final  Comment: (NOTE) SARS-CoV-2 target nucleic acids are NOT DETECTED.  The SARS-CoV-2 RNA is generally detectable in upper respiratory specimens during the acute phase of infection. The lowest concentration of SARS-CoV-2 viral copies this assay can detect is 138 copies/mL. A negative result does not preclude SARS-Cov-2 infection and should not be used as the sole basis for treatment or other patient management decisions. A negative result may occur with  improper specimen collection/handling, submission of specimen other than nasopharyngeal swab, presence of viral mutation(s) within the areas targeted by this assay, and inadequate number of viral copies(<138 copies/mL). A negative result must be combined with clinical observations, patient history, and epidemiological information. The expected result is Negative.  Fact Sheet for Patients:  EntrepreneurPulse.com.au  Fact Sheet for Healthcare Providers:  IncredibleEmployment.be  This test is no t yet approved or cleared by the [place] FDA and  has been authorized for detection and/or diagnosis of SARS-CoV-2 by FDA under an Emergency Use Authorization (EUA). This EUA will remain  in effect (meaning this test can be used) for the duration of the COVID-19 declaration under Section 564(b)(1) of the Act, 21 U.S.C.section 360bbb-3(b)(1), unless the authorization is terminated   or revoked sooner.       Influenza A by PCR NEGATIVE NEGATIVE Final   Influenza B by PCR NEGATIVE NEGATIVE Final    Comment: (NOTE) The Xpert Xpress SARS-CoV-2/FLU/RSV plus assay is intended as an aid in the diagnosis of influenza from Nasopharyngeal swab specimens and should not be used as a sole basis for treatment. Nasal washings and aspirates are unacceptable for Xpert Xpress SARS-CoV-2/FLU/RSV testing.  Fact Sheet for Patients: EntrepreneurPulse.com.au  Fact Sheet for Healthcare Providers: IncredibleEmployment.be  This test is not yet approved or cleared by the [place] FDA and has been authorized for detection and/or diagnosis of SARS-CoV-2 by FDA under an Emergency Use Authorization (EUA). This EUA will remain in effect (meaning this test can be used) for the duration of the COVID-19 declaration under Section 564(b)(1) of the Act, 21 U.S.C. section 360bbb-3(b)(1), unless the authorization is terminated or revoked.  Performed at [facility], [place] [address], [place], [place] [address]       Studies: No results found.   Flora Lipps, MD  [facility] 02/14/2021  If 7PM-7AM, please contact night-coverage

## 2021-02-14 NOTE — Plan of Care (Signed)
  Problem: Clinical Measurements: Goal: Respiratory complications will improve Outcome: Progressing   Problem: Nutrition: Goal: Adequate nutrition will be maintained Outcome: Progressing   Problem: Safety: Goal: Ability to remain free from injury will improve Outcome: Progressing   

## 2021-02-14 NOTE — Consult Note (Signed)
NAMEGari Fleming, MRN:  [id number], DOB:  09-Jul-1970, LOS: 3 ADMISSION DATE:  02/11/2021, CONSULTATION DATE:  02/11/21 REFERRING MD:  Madelyn Flavors CHIEF COMPLAINT:  cough and shortness of breath   History of Present Illness:  50yM never smoker from [place] with chronic cough on whom we are asked to consult for abnormal CT Chest. He says he's had a cough for at least 3-4 years maybe longer. It is productive. Fills up half a cup of sputum over the course of the day, yellowish. He takes no inhalers. Over the last 3 weeks devleoped worsening dyspnea, anorexia, weight loss, fatigue. No fever, drenching night sweats.   Father had asthma. No other lung issues in the family.  No other exposures elicited. Moved to [place] 20 years ago, hasn't traveled back any time recently.  Pertinent  Medical History  Never smoker Chronic cough previously followed by Dr. Sherene Sires Chronic sinusitis   Significant Hospital Events: Including procedures, antibiotic start and stop dates in addition to other pertinent events   8/12 admitted, PCCM consulted  Interim History / Subjective:  Sputum samples never obtained from 8/12 when ordered originally  Patient feeling somewhat better but continues to cough and have shortness of breath when talking or with any exertion.  Objective   Blood pressure 110/77, pulse (!) 102, temperature 99.3 F (37.4 C), temperature source Oral, resp. rate (!) 22, SpO2 96 %.       No intake or output data in the 24 hours ending 02/14/21 1353  There were no vitals filed for this visit.  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  Plan:  - obtain sputum cultures: respiratory, AFB and fungal - Place on airborne precautions - Check quantiferon gold  - Continue prednisone - Continue brovana + yupelri nebulizer treatments - Will plan for bronchoscopy if not able to collect 3 consecutive sputum samples for AFB testing.   Best Practice (right click and "Reselect all SmartList Selections" daily)   Diet/type: Regular consistency (see orders) DVT prophylaxis: LMWH GI prophylaxis: N/A Lines: N/A Foley:  N/A Code Status:  full code Last date of multidisciplinary goals of care discussion [TBD]  Labs   CBC: Recent Labs  Lab 02/11/21 0748 02/11/21 1146 02/11/21 1530 02/13/21 0315 02/14/21 0532  WBC 8.7  --  6.8 10.7* 7.9  NEUTROABS  --   --  6.4  --   --   HGB 14.6 14.6 14.2 13.6 13.6  HCT 46.5 43.0 44.3 42.1 43.2  MCV 90.6  --  89.0 89.4 89.6  PLT 343  --  352 303 342    Basic Metabolic Panel: Recent Labs  Lab 02/11/21 0748 02/11/21 1146 02/12/21 0230 02/13/21 0315 02/14/21 0532  NA 137 139 138 135 138  K 3.6 3.8 3.4* 4.2 3.8  CL 99  --  100 99 99  CO2 30  --  32 28 32  GLUCOSE 102*  --  138* 93 98  BUN 19  --  15 12 13   CREATININE 1.26*  --  0.93 0.83 0.97  CALCIUM 9.5  --  9.3 9.3 9.2  MG  --   --  2.2 1.9 2.0  PHOS  --   --   --  3.9  --    GFR: CrCl cannot be calculated (Unknown ideal weight.). Recent Labs  Lab 02/11/21 0748 02/11/21 1530 02/13/21 0315 02/14/21 0532  PROCALCITON  --  <0.10  --   --   WBC 8.7 6.8 10.7* 7.9    Liver Function Tests: Recent Labs  Lab 02/11/21 0748 02/12/21 0230  AST 29 21  ALT 22 21  ALKPHOS 57 53  BILITOT 0.5 0.5  PROT 8.2* 7.7  ALBUMIN 3.3* 2.9*    No results for input(s): LIPASE, AMYLASE in the last 168 hours. No results for input(s): AMMONIA in the last 168 hours.  ABG    Component Value Date/Time   PHART 7.387 02/11/2021 1146   PCO2ART 52.7 (H) 02/11/2021 1146   PO2ART 45 (L) 02/11/2021 1146   HCO3 31.7 (H) 02/11/2021 1146   TCO2 33 (H) 02/11/2021 1146   O2SAT 80.0 02/11/2021 1146     Coagulation Profile: No results for input(s): INR, PROTIME in the last 168 hours.  Cardiac Enzymes: No results for input(s): CKTOTAL, CKMB, CKMBINDEX, TROPONINI in the last 168 hours.  HbA1C: No results found for: HGBA1C  CBG: No results for input(s): GLUCAP in the last 168 hours.  04/13/2021, MD [facility] Office: [phone]   See Amion for personal pager PCCM on call pager [phone] until 7pm. Please call Elink 7p-7a. [phone]

## 2021-02-14 NOTE — Plan of Care (Signed)
  Problem: Clinical Measurements: Goal: Respiratory complications will improve Outcome: Progressing   Problem: Activity: Goal: Risk for activity intolerance will decrease Outcome: Progressing   Problem: Nutrition: Goal: Adequate nutrition will be maintained Outcome: Progressing   

## 2021-02-15 LAB — ACID FAST SMEAR (AFB, MYCOBACTERIA): Acid Fast Smear: NEGATIVE

## 2021-02-15 LAB — MRSA NEXT GEN BY PCR, NASAL: MRSA by PCR Next Gen: NOT DETECTED

## 2021-02-15 MED ORDER — ALBUTEROL SULFATE (2.5 MG/3ML) 0.083% IN NEBU
3.0000 mL | INHALATION_SOLUTION | RESPIRATORY_TRACT | Status: DC | PRN
  Administered 2021-02-17: 3 mL via RESPIRATORY_TRACT
  Filled 2021-02-15: qty 3

## 2021-02-15 NOTE — Progress Notes (Signed)
NAMEMizael Fleming, MRN:  [id number], DOB:  [birth year], LOS: 4 ADMISSION DATE:  02/11/2021, CONSULTATION DATE:  02/11/21 REFERRING MD:  Madelyn Flavors CHIEF COMPLAINT:  cough and shortness of breath   History of Present Illness:  50yM never smoker from [place] with chronic cough on whom we are asked to consult for abnormal CT Chest. He says he's had a cough for at least 3-4 years maybe longer. It is productive. Fills up half a cup of sputum over the course of the day, yellowish. He takes no inhalers. Over the last 3 weeks devleoped worsening dyspnea, anorexia, weight loss, fatigue. No fever, drenching night sweats.   Father had asthma. No other lung issues in the family.  No other exposures elicited. Moved to [place] 20 years ago, hasn't traveled back any time recently.  Pertinent  Medical History  Never smoker Chronic cough previously followed by Dr. Sherene Sires Chronic sinusitis  Significant Hospital Events: Including procedures, antibiotic start and stop dates in addition to other pertinent events   8/12 admitted, PCCM consulted  Interim History / Subjective:   First sputum sample obtained yesterday. Patient without complains this morning but not feeling any better or worse.  Objective   Blood pressure 100/68, pulse (!) 105, temperature 98.5 F (36.9 C), temperature source Oral, resp. rate (!) 22, SpO2 95 %.       No intake or output data in the 24 hours ending 02/15/21 0800  There were no vitals filed for this visit.  Examination: General: thin male, no acute distress HEENT: No JVD lymphadenopathy is appreciated, sclera anicteric Neuro: Grossly intact without focal defect CV: RRR, no murmurs PULM:   minimal wheezing on left, scattered crackles GI: soft, non-tender, non-distended, BS + GU: deferred Extremities: warm/dry, negative edema  Skin: no rashes or lesions   Resolved Hospital Problem list   N/a  Assessment & Plan:   # Acute on chronic hypercapnic and hypoxemic  respiratory failure # Nodular bronchiectasis with mucoid impaction: # Scattered nodular infiltrates Patient's radiographic findings are more concerning for NTM infection but his clinical history of weight loss of 30lbs is concerning for MTB.  Plan:  - obtain sputum cultures: respiratory, AFB x 3 and fungal - Continue airborne precautions - f/u quantiferon gold  - Continue prednisone - Continue brovana + yupelri nebulizer treatments - Will plan for bronchoscopy if not able to collect 3 consecutive sputum samples for AFB testing.   Best Practice (right click and "Reselect all SmartList Selections" daily)   Diet/type: Regular consistency (see orders) DVT prophylaxis: LMWH GI prophylaxis: N/A Lines: N/A Foley:  N/A Code Status:  full code Last date of multidisciplinary goals of care discussion [TBD]  Labs   CBC: Recent Labs  Lab 02/11/21 0748 02/11/21 1146 02/11/21 1530 02/13/21 0315 02/14/21 0532  WBC 8.7  --  6.8 10.7* 7.9  NEUTROABS  --   --  6.4  --   --   HGB 14.6 14.6 14.2 13.6 13.6  HCT 46.5 43.0 44.3 42.1 43.2  MCV 90.6  --  89.0 89.4 89.6  PLT 343  --  352 303 342    Basic Metabolic Panel: Recent Labs  Lab 02/11/21 0748 02/11/21 1146 02/12/21 0230 02/13/21 0315 02/14/21 0532  NA 137 139 138 135 138  K 3.6 3.8 3.4* 4.2 3.8  CL 99  --  100 99 99  CO2 30  --  32 28 32  GLUCOSE 102*  --  138* 93 98  BUN 19  --  15 12 13   CREATININE 1.26*  --  0.93 0.83 0.97  CALCIUM 9.5  --  9.3 9.3 9.2  MG  --   --  2.2 1.9 2.0  PHOS  --   --   --  3.9  --    GFR: CrCl cannot be calculated (Unknown ideal weight.). Recent Labs  Lab 02/11/21 0748 02/11/21 1530 02/13/21 0315 02/14/21 0532  PROCALCITON  --  <0.10  --   --   WBC 8.7 6.8 10.7* 7.9    Liver Function Tests: Recent Labs  Lab 02/11/21 0748 02/12/21 0230  AST 29 21  ALT 22 21  ALKPHOS 57 53  BILITOT 0.5 0.5  PROT 8.2* 7.7  ALBUMIN 3.3* 2.9*   No results for input(s): LIPASE, AMYLASE in the  last 168 hours. No results for input(s): AMMONIA in the last 168 hours.  ABG    Component Value Date/Time   PHART 7.387 02/11/2021 1146   PCO2ART 52.7 (H) 02/11/2021 1146   PO2ART 45 (L) 02/11/2021 1146   HCO3 31.7 (H) 02/11/2021 1146   TCO2 33 (H) 02/11/2021 1146   O2SAT 80.0 02/11/2021 1146     Coagulation Profile: No results for input(s): INR, PROTIME in the last 168 hours.  Cardiac Enzymes: No results for input(s): CKTOTAL, CKMB, CKMBINDEX, TROPONINI in the last 168 hours.  HbA1C: No results found for: HGBA1C  CBG: No results for input(s): GLUCAP in the last 168 hours.  04/13/2021, MD [facility] Office: [phone]   See Amion for personal pager PCCM on call pager [phone] until 7pm. Please call Elink 7p-7a. [phone]

## 2021-02-15 NOTE — Progress Notes (Signed)
Pt arrived to unit via WC. Pt in negative pressure room, airborne precautions. Telemetry applied. Pt give container for sputum culture and was informed of need to stool sample as well, he verbalized understanding and will call if able to give specimen. Call bell within reach.

## 2021-02-15 NOTE — Progress Notes (Signed)
Attempted to call report to [id number].  Nurse in progression rounds and will call back.

## 2021-02-15 NOTE — Progress Notes (Signed)
Report called to Marcelino Duster, RN and patient being transferred at this time.

## 2021-02-15 NOTE — Progress Notes (Signed)
PROGRESS NOTE  Shane Fleming OIN:[id number] DOB: May 05, 1971 DOA: 02/11/2021 PCP: Claiborne Rigg, NP   LOS: 4 days   Brief narrative: Shane Fleming is a 50 y.o. male with medical history of sinusitis presented to the hospital with cough and shortness of breath for 3 weeks with productive sputum, wheezing and weight loss of around 32 pounds.  Denied any history of smoking.  Patient has not traveled recently outside of the [place] and has been living in [place] for 20 years and has history of pansinusitis.  He has been evaluated by ENT and pulmonary in the past.  In the ED, patient was noted to be hypoxic with 87% pulse ox on room air which improved with supplemental oxygen.  Labs were unremarkable.  He was given normal saline, IV fluids, albuterol, Solu-Medrol and patient was admitted to hospital.    During hospitalization, patient was given antibiotics and steroids and pulmonary consultation was made.  He was subsequently put on airborne precautions and AFB sputum was ordered.   Assessment/Plan:  Principal Problem:   Acute respiratory failure with hypoxia and hypercapnia (HCC) Active Problems:   Bronchiectasis (HCC)   Weight loss   Elevated total protein  Acute respiratory failure with hypoxia and hypercapnia  Likely secondary to bronchiectasis with exacerbation: Pulmonary is involved.  Currently on airborne isolation.  Ruling out PTB as well.  Chest x-ray shows scattered groundglass opacities with some bronchiectasis.  We will follow pulmonary recommendation.  Continue incentive spirometry flutter valve.  Continue Rocephin and doxycycline and steroids.  MRSA PCR negative.  Fungal culture negative in 1 day.  Gram stain of the sputum showed abundant WBCs with gram-positive cocci and rods.  Respiratory viral panel negative so far.  Patient does not have leukocytosis or fever.  Weight loss:  Unintentional due to lack of appetite.  Reported 32 pound weight loss over the last 3 weeks.  Could be  secondary to bronchiectasis.  Rule out pulmonary TB.  TSH within normal limits. hepatitis panel negative.    Hypokalemia.  improved after repletion.  Chronic sinusitis.  Continue symptomatic treatment.   DVT prophylaxis: enoxaparin (LOVENOX) injection 40 mg Start: 02/11/21 2000   Code Status: Full code  Family Communication: None  Status is: Inpatient  Remains inpatient appropriate because:Ongoing diagnostic testing needed not appropriate for outpatient work up, IV treatments appropriate due to intensity of illness or inability to take PO, and Inpatient level of care appropriate due to severity of illness  Dispo: The patient is from: Home              Anticipated d/c is to: Home              Patient currently is not medically stable to d/c.   Difficult to place patient No  Consultants: Pulmonary  Procedures: None  Anti-infectives:  Doxycycline and Rocephin.  Anti-infectives (From admission, onward)    Start     Dose/Rate Route Frequency Ordered Stop   02/12/21 1415  cefTRIAXone (ROCEPHIN) 1 g in sodium chloride 0.9 % 100 mL IVPB        1 g 200 mL/hr over 30 Minutes Intravenous Every 24 hours 02/12/21 1319     02/12/21 1415  doxycycline (VIBRA-TABS) tablet 100 mg        100 mg Oral Every 12 hours 02/12/21 1319        Subjective: Today, patient was seen and examined at bedside.  Still complains a lot of cough.  Denies pain or fever.  Objective: Vitals:  02/15/21 1005 02/15/21 1432  BP: 130/70 113/79  Pulse: (!) 110 (!) 105  Resp:    Temp: 98.2 F (36.8 C) 98.5 F (36.9 C)  SpO2: 94% 94%   No intake or output data in the 24 hours ending 02/15/21 1458  There were no vitals filed for this visit. There is no height or weight on file to calculate BMI.   Physical Exam:  General: Patient is alert awake oriented, thinly built on nasal cannula oxygen  HENT:   No scleral pallor or icterus noted. Oral mucosa is moist.  Chest:   Bilateral crackles and mild wheezes  noted. CVS: S1 &S2 heard. No murmur.  Regular rate and rhythm. Abdomen: Soft, nontender, nondistended.  Bowel sounds are heard.   Extremities: No cyanosis, clubbing or edema.  Peripheral pulses are palpable. Psych: Alert, awake and oriented, normal mood CNS:  No cranial nerve deficits.  Power equal in all extremities.   Skin: Warm and dry.  No rashes noted.  Data Review: I have personally reviewed the following laboratory data and studies,  CBC: Recent Labs  Lab 02/11/21 0748 02/11/21 1146 02/11/21 1530 02/13/21 0315 02/14/21 0532  WBC 8.7  --  6.8 10.7* 7.9  NEUTROABS  --   --  6.4  --   --   HGB 14.6 14.6 14.2 13.6 13.6  HCT 46.5 43.0 44.3 42.1 43.2  MCV 90.6  --  89.0 89.4 89.6  PLT 343  --  352 303 342    Basic Metabolic Panel: Recent Labs  Lab 02/11/21 0748 02/11/21 1146 02/12/21 0230 02/13/21 0315 02/14/21 0532  NA 137 139 138 135 138  K 3.6 3.8 3.4* 4.2 3.8  CL 99  --  100 99 99  CO2 30  --  32 28 32  GLUCOSE 102*  --  138* 93 98  BUN 19  --  15 12 13   CREATININE 1.26*  --  0.93 0.83 0.97  CALCIUM 9.5  --  9.3 9.3 9.2  MG  --   --  2.2 1.9 2.0  PHOS  --   --   --  3.9  --     Liver Function Tests: Recent Labs  Lab 02/11/21 0748 02/12/21 0230  AST 29 21  ALT 22 21  ALKPHOS 57 53  BILITOT 0.5 0.5  PROT 8.2* 7.7  ALBUMIN 3.3* 2.9*    No results for input(s): LIPASE, AMYLASE in the last 168 hours. No results for input(s): AMMONIA in the last 168 hours. Cardiac Enzymes: No results for input(s): CKTOTAL, CKMB, CKMBINDEX, TROPONINI in the last 168 hours. BNP (last 3 results) No results for input(s): BNP in the last 8760 hours.  ProBNP (last 3 results) No results for input(s): PROBNP in the last 8760 hours.  CBG: No results for input(s): GLUCAP in the last 168 hours. Recent Results (from the past 240 hour(s))  Resp Panel by RT-PCR (Flu A&B, Covid) Nasopharyngeal Swab     Status: None   Collection Time: 02/11/21  7:43 AM   Specimen:  Nasopharyngeal Swab; Nasopharyngeal(NP) swabs in vial transport medium  Result Value Ref Range Status   SARS Coronavirus 2 by RT PCR NEGATIVE NEGATIVE Final    Comment: (NOTE) SARS-CoV-2 target nucleic acids are NOT DETECTED.  The SARS-CoV-2 RNA is generally detectable in upper respiratory specimens during the acute phase of infection. The lowest concentration of SARS-CoV-2 viral copies this assay can detect is 138 copies/mL. A negative result does not preclude SARS-Cov-2 infection and should  not be used as the sole basis for treatment or other patient management decisions. A negative result may occur with  improper specimen collection/handling, submission of specimen other than nasopharyngeal swab, presence of viral mutation(s) within the areas targeted by this assay, and inadequate number of viral copies(<138 copies/mL). A negative result must be combined with clinical observations, patient history, and epidemiological information. The expected result is Negative.  Fact Sheet for Patients:  BloggerCourse.com  Fact Sheet for Healthcare Providers:  SeriousBroker.it  This test is no t yet approved or cleared by the [place] FDA and  has been authorized for detection and/or diagnosis of SARS-CoV-2 by FDA under an Emergency Use Authorization (EUA). This EUA will remain  in effect (meaning this test can be used) for the duration of the COVID-19 declaration under Section 564(b)(1) of the Act, 21 U.S.C.section 360bbb-3(b)(1), unless the authorization is terminated  or revoked sooner.       Influenza A by PCR NEGATIVE NEGATIVE Final   Influenza B by PCR NEGATIVE NEGATIVE Final    Comment: (NOTE) The Xpert Xpress SARS-CoV-2/FLU/RSV plus assay is intended as an aid in the diagnosis of influenza from Nasopharyngeal swab specimens and should not be used as a sole basis for treatment. Nasal washings and aspirates are unacceptable for  Xpert Xpress SARS-CoV-2/FLU/RSV testing.  Fact Sheet for Patients: BloggerCourse.com  Fact Sheet for Healthcare Providers: SeriousBroker.it  This test is not yet approved or cleared by the [place] FDA and has been authorized for detection and/or diagnosis of SARS-CoV-2 by FDA under an Emergency Use Authorization (EUA). This EUA will remain in effect (meaning this test can be used) for the duration of the COVID-19 declaration under Section 564(b)(1) of the Act, 21 U.S.C. section 360bbb-3(b)(1), unless the authorization is terminated or revoked.  Performed at [facility], [address], [place], [place] [address]   Culture, fungus without smear     Status: None (Preliminary result)   Collection Time: 02/11/21  4:28 PM   Specimen: Sputum; Other  Result Value Ref Range Status   Specimen Description SPU  Final   Special Requests NONE  Final   Culture   Final    NO FUNGUS ISOLATED AFTER 1 DAY Performed at [facility], [address], [place], [place] [address]    Report Status PENDING  Incomplete  Expectorated Sputum Assessment w Gram Stain, Rflx to Resp Cult     Status: None   Collection Time: 02/14/21 11:20 AM   Specimen: Expectorated Sputum  Result Value Ref Range Status   Specimen Description EXPECTORATED SPUTUM  Final   Special Requests NONE  Final   Sputum evaluation   Final    THIS SPECIMEN IS ACCEPTABLE FOR SPUTUM CULTURE Performed at [facility], [address], [place], [place] [address]    Report Status 02/14/2021 FINAL  Final  Respiratory (~20 pathogens) panel by PCR     Status: None   Collection Time: 02/14/21 11:20 AM   Specimen: Nasopharyngeal Swab; Respiratory  Result Value Ref Range Status   Adenovirus NOT DETECTED NOT DETECTED Final   Coronavirus 229E NOT DETECTED NOT DETECTED Final    Comment: (NOTE) The Coronavirus on the Respiratory Panel, DOES NOT test for the novel   Coronavirus (2019 nCoV)    Coronavirus HKU1 NOT DETECTED NOT DETECTED Final   Coronavirus NL63 NOT DETECTED NOT DETECTED Final   Coronavirus OC43 NOT DETECTED NOT DETECTED Final   Metapneumovirus NOT DETECTED NOT DETECTED Final  Rhinovirus / Enterovirus NOT DETECTED NOT DETECTED Final   Influenza A NOT DETECTED NOT DETECTED Final   Influenza B NOT DETECTED NOT DETECTED Final   Parainfluenza Virus 1 NOT DETECTED NOT DETECTED Final   Parainfluenza Virus 2 NOT DETECTED NOT DETECTED Final   Parainfluenza Virus 3 NOT DETECTED NOT DETECTED Final   Parainfluenza Virus 4 NOT DETECTED NOT DETECTED Final   Respiratory Syncytial Virus NOT DETECTED NOT DETECTED Final   Bordetella pertussis NOT DETECTED NOT DETECTED Final   Bordetella Parapertussis NOT DETECTED NOT DETECTED Final   Chlamydophila pneumoniae NOT DETECTED NOT DETECTED Final   Mycoplasma pneumoniae NOT DETECTED NOT DETECTED Final    Comment: Performed at [facility], [address] [place], [place] [address]  Culture, Respiratory w Gram Stain     Status: None (Preliminary result)   Collection Time: 02/14/21 11:20 AM  Result Value Ref Range Status   Specimen Description EXPECTORATED SPUTUM  Final   Special Requests NONE Reflexed from [id number]  Final   Gram Stain   Final    ABUNDANT WBC PRESENT, PREDOMINANTLY PMN FEW GRAM POSITIVE COCCI RARE GRAM NEGATIVE RODS    Culture   Final    CULTURE REINCUBATED FOR BETTER GROWTH Performed at [facility], [address] [place], [place] [address]    Report Status PENDING  Incomplete  MRSA Next Gen by PCR, Nasal     Status: None   Collection Time: 02/15/21 12:19 PM   Specimen: Sputum; Nasal Swab  Result Value Ref Range Status   MRSA by PCR Next Gen NOT DETECTED NOT DETECTED Final    Comment: (NOTE) The GeneXpert MRSA Assay (FDA approved for NASAL specimens only), is one component of a comprehensive MRSA colonization surveillance program. It is not intended to diagnose  MRSA infection nor to guide or monitor treatment for MRSA infections. Test performance is not FDA approved in patients less than 52 years old. Performed at [facility], [address] [place], [place] [address]       Studies: No results found.   Joycelyn DasLaxman Raygen Dahm, MD  [facility] 02/15/2021  If 7PM-7AM, please contact night-coverage

## 2021-02-16 ENCOUNTER — Encounter (HOSPITAL_COMMUNITY): Payer: Self-pay | Admitting: Internal Medicine

## 2021-02-16 ENCOUNTER — Inpatient Hospital Stay (HOSPITAL_COMMUNITY): Payer: Self-pay

## 2021-02-16 DIAGNOSIS — E43 Unspecified severe protein-calorie malnutrition: Secondary | ICD-10-CM | POA: Insufficient documentation

## 2021-02-16 DIAGNOSIS — J9601 Acute respiratory failure with hypoxia: Secondary | ICD-10-CM

## 2021-02-16 LAB — ECHOCARDIOGRAM COMPLETE

## 2021-02-16 LAB — IGE: IgE (Immunoglobulin E), Serum: 252 IU/mL (ref 6–495)

## 2021-02-16 MED ORDER — SODIUM CHLORIDE 3 % IN NEBU
4.0000 mL | INHALATION_SOLUTION | Freq: Two times a day (BID) | RESPIRATORY_TRACT | Status: DC
  Administered 2021-02-17 – 2021-02-18 (×2): 4 mL via RESPIRATORY_TRACT
  Filled 2021-02-16 (×7): qty 4

## 2021-02-16 MED ORDER — ADULT MULTIVITAMIN W/MINERALS CH
1.0000 | ORAL_TABLET | Freq: Every day | ORAL | Status: DC
  Administered 2021-02-16 – 2021-02-18 (×3): 1 via ORAL
  Filled 2021-02-16 (×3): qty 1

## 2021-02-16 MED ORDER — ENSURE ENLIVE PO LIQD
237.0000 mL | Freq: Two times a day (BID) | ORAL | Status: DC
  Administered 2021-02-16 – 2021-02-18 (×3): 237 mL via ORAL

## 2021-02-16 NOTE — Progress Notes (Signed)
PROGRESS NOTE  Shane Pilibdoul Gruenhagen ZOX:[id number] DOB: 02/03/1971 DOA: 02/11/2021 PCP: Claiborne RiggFleming, Zelda W, NP   LOS: 5 days   Brief narrative: Shane Fleming is a 50 y.o. male with medical history of sinusitis presented to the hospital with cough and shortness of breath for 3 weeks with productive sputum, wheezing and weight loss of around 32 pounds.  Denied any history of smoking.  Patient has not traveled recently outside of the [place] and has been living in [place] for 20 years and has history of pansinusitis.  He has been evaluated by ENT and pulmonary in the past.  In the ED, patient was noted to be hypoxic with 87% pulse ox on room air which improved with supplemental oxygen.  Labs were unremarkable.  Patient was given normal saline, IV fluids, albuterol, Solu-Medrol and patient was admitted to hospital.    During hospitalization, patient was given antibiotics and steroids and pulmonary consultation was made.  He was subsequently put on airborne precautions and AFB sputum was ordered.   Assessment/Plan:  Principal Problem:   Acute respiratory failure with hypoxia and hypercapnia (HCC) Active Problems:   Bronchiectasis (HCC)   Weight loss   Elevated total protein  Acute respiratory failure with hypoxia and hypercapnia  Likely secondary to bronchiectasis with exacerbation: Pulmonary involved.  Currently on airborne isolation.  Ruling out PTB as well.  Chest x-ray shows scattered groundglass opacities with some bronchiectasis.  AFB smear x1 negative.  Continue incentive spirometry ,flutter valve.  Continue Rocephin, doxycycline and steroids.  MRSA PCR negative.  Fungal culture negative in 1 day.  Gram stain of the sputum showed abundant WBCs with gram-positive cocci and rods.  Respiratory viral panel negative so far.  Patient does not have leukocytosis or fever.  Weight loss:  Unintentional due to lack of appetite.  Reported 32 pound weight loss over the last 3 weeks.  Could be secondary to  bronchiectasis.  Rule out pulmonary TB.  TSH within normal limits. hepatitis panel negative.    Hypokalemia.  improved after repletion.  Latest potassium of 3.8  Chronic sinusitis.  Continue symptomatic treatment.   DVT prophylaxis: enoxaparin (LOVENOX) injection 40 mg Start: 02/11/21 2000   Code Status: Full code  Family Communication: None  Status is: Inpatient  Remains inpatient appropriate because:Ongoing diagnostic testing needed not appropriate for outpatient work up, IV treatments appropriate due to intensity of illness or inability to take PO, and Inpatient level of care appropriate due to severity of illness  Dispo: The patient is from: Home              Anticipated d/c is to: Home              Patient currently is not medically stable to d/c.   Difficult to place patient No  Consultants: Pulmonary  Procedures: None  Anti-infectives:  Doxycycline and Rocephin.  Anti-infectives (From admission, onward)    Start     Dose/Rate Route Frequency Ordered Stop   02/12/21 1415  cefTRIAXone (ROCEPHIN) 1 g in sodium chloride 0.9 % 100 mL IVPB        1 g 200 mL/hr over 30 Minutes Intravenous Every 24 hours 02/12/21 1319     02/12/21 1415  doxycycline (VIBRA-TABS) tablet 100 mg        100 mg Oral Every 12 hours 02/12/21 1319        Subjective: Today, patient was seen and examined at bedside.  Still complains of a lot of cough.  Denies any chest pain,  fever, chills or rigor.  He said he could not breathe well.  Objective: Vitals:   02/16/21 0858 02/16/21 0905  BP:    Pulse:    Resp:    Temp:    SpO2: 97% 99%   No intake or output data in the 24 hours ending 02/16/21 1317  There were no vitals filed for this visit. There is no height or weight on file to calculate BMI.   Physical Exam:  General: Thinly built, on nasal cannula oxygen, alert awake and oriented HENT:   No scleral pallor or icterus noted. Oral mucosa is moist.  Chest: Decreased breath sounds  bilaterally, crackles and wheezes. CVS: S1 &S2 heard. No murmur.  Regular rate and rhythm. Abdomen: Soft, nontender, nondistended.  Bowel sounds are heard.   Extremities: No cyanosis, clubbing or edema.  Peripheral pulses are palpable. Psych: Alert, awake and oriented, normal mood CNS:  No cranial nerve deficits.  Power equal in all extremities.   Skin: Warm and dry.  No rashes noted.   Data Review: I have personally reviewed the following laboratory data and studies,  CBC: Recent Labs  Lab 02/11/21 0748 02/11/21 1146 02/11/21 1530 02/13/21 0315 02/14/21 0532  WBC 8.7  --  6.8 10.7* 7.9  NEUTROABS  --   --  6.4  --   --   HGB 14.6 14.6 14.2 13.6 13.6  HCT 46.5 43.0 44.3 42.1 43.2  MCV 90.6  --  89.0 89.4 89.6  PLT 343  --  352 303 342    Basic Metabolic Panel: Recent Labs  Lab 02/11/21 0748 02/11/21 1146 02/12/21 0230 02/13/21 0315 02/14/21 0532  NA 137 139 138 135 138  K 3.6 3.8 3.4* 4.2 3.8  CL 99  --  100 99 99  CO2 30  --  32 28 32  GLUCOSE 102*  --  138* 93 98  BUN 19  --  15 12 13   CREATININE 1.26*  --  0.93 0.83 0.97  CALCIUM 9.5  --  9.3 9.3 9.2  MG  --   --  2.2 1.9 2.0  PHOS  --   --   --  3.9  --     Liver Function Tests: Recent Labs  Lab 02/11/21 0748 02/12/21 0230  AST 29 21  ALT 22 21  ALKPHOS 57 53  BILITOT 0.5 0.5  PROT 8.2* 7.7  ALBUMIN 3.3* 2.9*    No results for input(s): LIPASE, AMYLASE in the last 168 hours. No results for input(s): AMMONIA in the last 168 hours. Cardiac Enzymes: No results for input(s): CKTOTAL, CKMB, CKMBINDEX, TROPONINI in the last 168 hours. BNP (last 3 results) No results for input(s): BNP in the last 8760 hours.  ProBNP (last 3 results) No results for input(s): PROBNP in the last 8760 hours.  CBG: No results for input(s): GLUCAP in the last 168 hours. Recent Results (from the past 240 hour(s))  Resp Panel by RT-PCR (Flu A&B, Covid) Nasopharyngeal Swab     Status: None   Collection Time: 02/11/21   7:43 AM   Specimen: Nasopharyngeal Swab; Nasopharyngeal(NP) swabs in vial transport medium  Result Value Ref Range Status   SARS Coronavirus 2 by RT PCR NEGATIVE NEGATIVE Final    Comment: (NOTE) SARS-CoV-2 target nucleic acids are NOT DETECTED.  The SARS-CoV-2 RNA is generally detectable in upper respiratory specimens during the acute phase of infection. The lowest concentration of SARS-CoV-2 viral copies this assay can detect is 138 copies/mL. A negative result  does not preclude SARS-Cov-2 infection and should not be used as the sole basis for treatment or other patient management decisions. A negative result may occur with  improper specimen collection/handling, submission of specimen other than nasopharyngeal swab, presence of viral mutation(s) within the areas targeted by this assay, and inadequate number of viral copies(<138 copies/mL). A negative result must be combined with clinical observations, patient history, and epidemiological information. The expected result is Negative.  Fact Sheet for Patients:  BloggerCourse.com  Fact Sheet for Healthcare Providers:  SeriousBroker.it  This test is no t yet approved or cleared by the [place] FDA and  has been authorized for detection and/or diagnosis of SARS-CoV-2 by FDA under an Emergency Use Authorization (EUA). This EUA will remain  in effect (meaning this test can be used) for the duration of the COVID-19 declaration under Section 564(b)(1) of the Act, 21 U.S.C.section 360bbb-3(b)(1), unless the authorization is terminated  or revoked sooner.       Influenza A by PCR NEGATIVE NEGATIVE Final   Influenza B by PCR NEGATIVE NEGATIVE Final    Comment: (NOTE) The Xpert Xpress SARS-CoV-2/FLU/RSV plus assay is intended as an aid in the diagnosis of influenza from Nasopharyngeal swab specimens and should not be used as a sole basis for treatment. Nasal washings and aspirates  are unacceptable for Xpert Xpress SARS-CoV-2/FLU/RSV testing.  Fact Sheet for Patients: BloggerCourse.com  Fact Sheet for Healthcare Providers: SeriousBroker.it  This test is not yet approved or cleared by the [place] FDA and has been authorized for detection and/or diagnosis of SARS-CoV-2 by FDA under an Emergency Use Authorization (EUA). This EUA will remain in effect (meaning this test can be used) for the duration of the COVID-19 declaration under Section 564(b)(1) of the Act, 21 U.S.C. section 360bbb-3(b)(1), unless the authorization is terminated or revoked.  Performed at [facility], [address], [place], [place] [address]   Culture, fungus without smear     Status: None (Preliminary result)   Collection Time: 02/11/21  4:28 PM   Specimen: Sputum; Other  Result Value Ref Range Status   Specimen Description SPU  Final   Special Requests NONE  Final   Culture   Final    NO FUNGUS ISOLATED AFTER 1 DAY Performed at [facility], [address], [place], [place] [address]    Report Status PENDING  Incomplete  Acid Fast Smear (AFB)     Status: None   Collection Time: 02/14/21 10:25 AM   Specimen: Sputum  Result Value Ref Range Status   AFB Specimen Processing Concentration  Final   Acid Fast Smear Negative  Final    Comment: (NOTE) Performed At: [facility] [address] [place], [place] [id number] Jolene Schimke MD JA:[id number]    Source (AFB) SPU  Final    Comment: Performed at [facility], [address], [place], [place] [address]  Expectorated Sputum Assessment w Gram Stain, Rflx to Resp Cult     Status: None   Collection Time: 02/14/21 11:20 AM   Specimen: Expectorated Sputum  Result Value Ref Range Status   Specimen Description EXPECTORATED SPUTUM  Final   Special Requests NONE  Final   Sputum evaluation   Final    THIS SPECIMEN IS ACCEPTABLE FOR SPUTUM  CULTURE Performed at [facility], [address], [place], [place] [address]    Report Status 02/14/2021 FINAL  Final  Respiratory (~20 pathogens) panel by PCR  Status: None   Collection Time: 02/14/21 11:20 AM   Specimen: Nasopharyngeal Swab; Respiratory  Result Value Ref Range Status   Adenovirus NOT DETECTED NOT DETECTED Final   Coronavirus 229E NOT DETECTED NOT DETECTED Final    Comment: (NOTE) The Coronavirus on the Respiratory Panel, DOES NOT test for the novel  Coronavirus (2019 nCoV)    Coronavirus HKU1 NOT DETECTED NOT DETECTED Final   Coronavirus NL63 NOT DETECTED NOT DETECTED Final   Coronavirus OC43 NOT DETECTED NOT DETECTED Final   Metapneumovirus NOT DETECTED NOT DETECTED Final   Rhinovirus / Enterovirus NOT DETECTED NOT DETECTED Final   Influenza A NOT DETECTED NOT DETECTED Final   Influenza B NOT DETECTED NOT DETECTED Final   Parainfluenza Virus 1 NOT DETECTED NOT DETECTED Final   Parainfluenza Virus 2 NOT DETECTED NOT DETECTED Final   Parainfluenza Virus 3 NOT DETECTED NOT DETECTED Final   Parainfluenza Virus 4 NOT DETECTED NOT DETECTED Final   Respiratory Syncytial Virus NOT DETECTED NOT DETECTED Final   Bordetella pertussis NOT DETECTED NOT DETECTED Final   Bordetella Parapertussis NOT DETECTED NOT DETECTED Final   Chlamydophila pneumoniae NOT DETECTED NOT DETECTED Final   Mycoplasma pneumoniae NOT DETECTED NOT DETECTED Final    Comment: Performed at [facility], [address], [place], [place] [address]  Culture, Respiratory w Gram Stain     Status: None (Preliminary result)   Collection Time: 02/14/21 11:20 AM  Result Value Ref Range Status   Specimen Description EXPECTORATED SPUTUM  Final   Special Requests NONE Reflexed from [id number]  Final   Gram Stain   Final    ABUNDANT WBC PRESENT, PREDOMINANTLY PMN FEW GRAM POSITIVE COCCI RARE GRAM NEGATIVE RODS    Culture   Final    CULTURE REINCUBATED FOR BETTER GROWTH Performed at [facility], [address] [place], [place] [address]    Report Status PENDING  Incomplete  MRSA Next Gen by PCR, Nasal     Status: None   Collection Time: 02/15/21 12:19 PM   Specimen: Sputum; Nasal Swab  Result Value Ref Range Status   MRSA by PCR Next Gen NOT DETECTED NOT DETECTED Final    Comment: (NOTE) The GeneXpert MRSA Assay (FDA approved for NASAL specimens only), is one component of a comprehensive MRSA colonization surveillance program. It is not intended to diagnose MRSA infection nor to guide or monitor treatment for MRSA infections. Test performance is not FDA approved in patients less than 62 years old. Performed at [facility], [address], [place], [place] [address]       Studies: No results found.   Joycelyn Das, MD  [facility] 02/16/2021  If 7PM-7AM, please contact night-coverage

## 2021-02-16 NOTE — Progress Notes (Signed)
  Echocardiogram 2D Echocardiogram has been performed.  Shane Fleming F 02/16/2021, 10:48 AM

## 2021-02-16 NOTE — Progress Notes (Signed)
Initial Nutrition Assessment  DOCUMENTATION CODES:   Severe malnutrition in context of acute illness/injury  INTERVENTION:   -Ensure Enlive po BID, each supplement provides 350 kcal and 20 grams of protein  -MVI with minerals daily  NUTRITION DIAGNOSIS:   Severe Malnutrition related to acute illness (possible TB) as evidenced by moderate fat depletion, severe fat depletion, moderate muscle depletion, severe muscle depletion.  GOAL:   Patient will meet greater than or equal to 90% of their needs  MONITOR:   PO intake, Supplement acceptance, Diet advancement, Labs, Weight trends, Skin, I & O's  REASON FOR ASSESSMENT:   Malnutrition Screening Tool    ASSESSMENT:   Shane Fleming never smoker from [place] with chronic cough on whom we are asked to consult for abnormal CT Chest. He says he's had a cough for at least 3-4 years maybe longer. It is productive. Fills up half a cup of sputum over the course of the day, yellowish. He takes no inhalers. Over the last 3 weeks devleoped worsening dyspnea, anorexia, weight loss, fatigue. No fever, drenching night sweats.  Pt admitted with acute respiratory failure.   Reviewed I/O's: +240 ml x 24 hours and +1.4 L x 24 hours   Per MD notes, plan to rule out TB.   Case discussed with RN, who reports pt is eating and taking medications well.  Spoke with pt at bedside, who reports appetite has improved since admission. Pt reports progressively declining oral intake for 3 weeks PTA. At baseline, he typically has good appetite, consuming 3 meals per day (Breakfast: eggs, toast, and fruit; Lunch: meat, starch, vegetables, and fruit). Observed breakfast tray- pt consumed 100% of breakfast tray. Noted meal completions 100%.   Pt reports his UBW is round 172#. He estimates he has lost about 37# over the past week. No wt hx available to assess this at this time.   Discussed importance of good meal and supplement intake to promote healing. Pt amenable to Ensure.    Medications reviewed and include prednisone.   Labs reviewed.   NUTRITION - FOCUSED PHYSICAL EXAM:  Flowsheet Row Most Recent Value  Orbital Region Moderate depletion  Upper Arm Region Severe depletion  Thoracic and Lumbar Region Moderate depletion  Buccal Region Severe depletion  Temple Region Severe depletion  Clavicle Bone Region Severe depletion  Clavicle and Acromion Bone Region Severe depletion  Scapular Bone Region Severe depletion  Dorsal Hand Moderate depletion  Patellar Region Severe depletion  Anterior Thigh Region Severe depletion  Posterior Calf Region Severe depletion  Edema (RD Assessment) None  Hair Reviewed  Eyes Reviewed  Mouth Reviewed  Skin Reviewed  Nails Reviewed       Diet Order:   Diet Order             Diet regular Room service appropriate? Yes; Fluid consistency: Thin  Diet effective now                   EDUCATION NEEDS:   Education needs have been addressed  Skin:  Skin Assessment: Reviewed RN Assessment  Last BM:  02/15/21  Height:   Ht Readings from Last 1 Encounters:  10/05/17 5\' 11"  (1.803 m)    Weight:   Wt Readings from Last 1 Encounters:  10/05/17 73.9 kg    Ideal Body Weight:  78.2 kg  BMI:  There is no height or weight on file to calculate BMI.  Estimated Nutritional Needs:   Kcal:  2200-2400  Protein:  110-125 grams  Fluid:  >  [place], RD, LDN, [place] Registered Dietitian II Certified Diabetes Care and Education Specialist Please refer to [facility] for RD and/or RD on-call/weekend/after hours pager

## 2021-02-16 NOTE — Progress Notes (Signed)
NAMEEva Fleming, MRN:  [id number], DOB:  12-Nov-1970, LOS: 5 ADMISSION DATE:  02/11/2021, CONSULTATION DATE:  02/11/21 REFERRING MD:  Madelyn Flavors CHIEF COMPLAINT:  cough and shortness of breath   History of Present Illness:  50yM never smoker from [place] with chronic cough on whom we are asked to consult for abnormal CT Chest. He says he's had a cough for at least 3-4 years maybe longer. It is productive. Fills up half a cup of sputum over the course of the day, yellowish. He takes no inhalers. Over the last 3 weeks devleoped worsening dyspnea, anorexia, weight loss, fatigue. No fever, drenching night sweats.   Father had asthma. No other lung issues in the family.  No other exposures elicited. Moved to [place] 20 years ago, hasn't traveled back any time recently.  Pertinent  Medical History  Never smoker Chronic cough previously followed by Dr. Sherene Sires Chronic sinusitis  Significant Hospital Events: Including procedures, antibiotic start and stop dates in addition to other pertinent events   8/12 admitted, PCCM consulted  Interim History / Subjective:   Patient feeling somewhat better. His appetite is coming back to him. Still coughing with dyspnea.  Objective   Blood pressure 106/81, pulse 99, temperature 98.4 F (36.9 C), temperature source Oral, resp. rate 19, SpO2 97 %.        Intake/Output Summary (Last 24 hours) at 02/16/2021 1728 Last data filed at 02/16/2021 1700 Gross per 24 hour  Intake 480 ml  Output --  Net 480 ml    There were no vitals filed for this visit.  Examination: General: thin male, no acute distress HEENT: No JVD lymphadenopathy is appreciated, sclera anicteric Neuro: Grossly intact without focal defect CV: RRR, no murmurs PULM:   minimal wheezing on left, scattered crackles GI: soft, non-tender, non-distended, BS + GU: deferred Extremities: warm/dry, negative edema  Skin: no rashes or lesions   Resolved Hospital Problem list    N/a  Assessment & Plan:   # Acute on chronic hypercapnic and hypoxemic respiratory failure # Nodular bronchiectasis with mucoid impaction: # Scattered nodular infiltrates #Pneumonia due to Klebsiella   Patient's radiographic findings are more concerning for NTM infection but his clinical history of weight loss of 30lbs is concerning for MTB.  Plan:  - obtain sputum cultures: respiratory, AFB x 3 and fungal - Check ANA, ANCA - Continue airborne precautions - f/u quantiferon gold  - Continue prednisone - Continue brovana + yupelri nebulizer treatments. Add hypertonic nebs BID and continue flutter valve - Will plan for bronchoscopy tomorrow afternoon. Patient to be NPO tonight. - Continue antibiotics for klebsiella pneumoniae pneumonia, awaiting sensitivities   Best Practice (right click and "Reselect all SmartList Selections" daily)   Diet/type: Regular consistency (see orders), npo after midnight. DVT prophylaxis: LMWH GI prophylaxis: N/A Lines: N/A Foley:  N/A Code Status:  full code Last date of multidisciplinary goals of care discussion [TBD]  Labs   CBC: Recent Labs  Lab 02/11/21 0748 02/11/21 1146 02/11/21 1530 02/13/21 0315 02/14/21 0532  WBC 8.7  --  6.8 10.7* 7.9  NEUTROABS  --   --  6.4  --   --   HGB 14.6 14.6 14.2 13.6 13.6  HCT 46.5 43.0 44.3 42.1 43.2  MCV 90.6  --  89.0 89.4 89.6  PLT 343  --  352 303 342    Basic Metabolic Panel: Recent Labs  Lab 02/11/21 0748 02/11/21 1146 02/12/21 0230 02/13/21 0315 02/14/21 0532  NA 137 139 138  135 138  K 3.6 3.8 3.4* 4.2 3.8  CL 99  --  100 99 99  CO2 30  --  32 28 32  GLUCOSE 102*  --  138* 93 98  BUN 19  --  15 12 13   CREATININE 1.26*  --  0.93 0.83 0.97  CALCIUM 9.5  --  9.3 9.3 9.2  MG  --   --  2.2 1.9 2.0  PHOS  --   --   --  3.9  --    GFR: CrCl cannot be calculated (Unknown ideal weight.). Recent Labs  Lab 02/11/21 0748 02/11/21 1530 02/13/21 0315 02/14/21 0532  PROCALCITON  --   <0.10  --   --   WBC 8.7 6.8 10.7* 7.9    Liver Function Tests: Recent Labs  Lab 02/11/21 0748 02/12/21 0230  AST 29 21  ALT 22 21  ALKPHOS 57 53  BILITOT 0.5 0.5  PROT 8.2* 7.7  ALBUMIN 3.3* 2.9*   No results for input(s): LIPASE, AMYLASE in the last 168 hours. No results for input(s): AMMONIA in the last 168 hours.  ABG    Component Value Date/Time   PHART 7.387 02/11/2021 1146   PCO2ART 52.7 (H) 02/11/2021 1146   PO2ART 45 (L) 02/11/2021 1146   HCO3 31.7 (H) 02/11/2021 1146   TCO2 33 (H) 02/11/2021 1146   O2SAT 80.0 02/11/2021 1146     Coagulation Profile: No results for input(s): INR, PROTIME in the last 168 hours.  Cardiac Enzymes: No results for input(s): CKTOTAL, CKMB, CKMBINDEX, TROPONINI in the last 168 hours.  HbA1C: No results found for: HGBA1C  CBG: No results for input(s): GLUCAP in the last 168 hours.  04/13/2021, MD [facility] Office: [phone]   See Amion for personal pager PCCM on call pager [phone] until 7pm. Please call Elink 7p-7a. [phone]

## 2021-02-17 ENCOUNTER — Inpatient Hospital Stay (HOSPITAL_COMMUNITY): Payer: Self-pay | Admitting: Anesthesiology

## 2021-02-17 ENCOUNTER — Telehealth: Payer: Self-pay | Admitting: Pulmonary Disease

## 2021-02-17 ENCOUNTER — Encounter (HOSPITAL_COMMUNITY)
Admission: EM | Disposition: A | Discharge status: Home / Self Care | Payer: Self-pay | Source: Home / Self Care | Attending: Internal Medicine

## 2021-02-17 ENCOUNTER — Encounter (HOSPITAL_COMMUNITY): Payer: Self-pay | Admitting: Internal Medicine

## 2021-02-17 HISTORY — PX: BRONCHIAL WASHINGS: SHX5105

## 2021-02-17 HISTORY — PX: VIDEO BRONCHOSCOPY: SHX5072

## 2021-02-17 LAB — ANCA TITERS
Atypical P-ANCA titer: <1:20 {titer}
C-ANCA: <1:20 {titer}
P-ANCA: <1:20 {titer}

## 2021-02-17 LAB — ANA W/REFLEX IF POSITIVE: Anti Nuclear Antibody (ANA): NEGATIVE

## 2021-02-17 LAB — BODY FLUID CELL COUNT WITH DIFFERENTIAL
Lymphs, Fluid: 1 %
Neutrophil Count, Fluid: 99 % — ABNORMAL HIGH (ref 0–25)
Total Nucleated Cell Count, Fluid: 744 cu mm (ref 0–1000)

## 2021-02-17 LAB — CULTURE, RESPIRATORY W GRAM STAIN

## 2021-02-17 SURGERY — VIDEO BRONCHOSCOPY WITHOUT FLUORO
Anesthesia: General

## 2021-02-17 MED ORDER — LIDOCAINE 2% (20 MG/ML) 5 ML SYRINGE
INTRAMUSCULAR | Status: DC | PRN
Start: 2021-02-17 — End: 2021-02-17
  Administered 2021-02-17: 60 mg via INTRAVENOUS

## 2021-02-17 MED ORDER — SODIUM CHLORIDE 0.9 % IV SOLN: INTRAVENOUS | Status: DC

## 2021-02-17 MED ORDER — LACTATED RINGERS IV SOLN: INTRAVENOUS | Status: DC | PRN

## 2021-02-17 MED ORDER — FENTANYL CITRATE (PF) 100 MCG/2ML IJ SOLN
INTRAMUSCULAR | Status: DC | PRN
  Administered 2021-02-17: 50 ug via INTRAVENOUS

## 2021-02-17 MED ORDER — ROCURONIUM BROMIDE 10 MG/ML (PF) SYRINGE
PREFILLED_SYRINGE | INTRAVENOUS | Status: DC | PRN
  Administered 2021-02-17: 40 mg via INTRAVENOUS

## 2021-02-17 MED ORDER — PHENYLEPHRINE HCL-NACL 20-0.9 MG/250ML-% IV SOLN
INTRAVENOUS | Status: DC | PRN
  Administered 2021-02-17: 50 ug/min via INTRAVENOUS

## 2021-02-17 MED ORDER — MIDAZOLAM HCL 5 MG/5ML IJ SOLN
INTRAMUSCULAR | Status: DC | PRN
  Administered 2021-02-17: 2 mg via INTRAVENOUS

## 2021-02-17 MED ORDER — SUCCINYLCHOLINE CHLORIDE 200 MG/10ML IV SOSY
PREFILLED_SYRINGE | INTRAVENOUS | Status: DC | PRN
  Administered 2021-02-17: 140 mg via INTRAVENOUS

## 2021-02-17 MED ORDER — PROPOFOL 10 MG/ML IV BOLUS
INTRAVENOUS | Status: DC | PRN
  Administered 2021-02-17: 120 mg via INTRAVENOUS

## 2021-02-17 MED ORDER — SUGAMMADEX SODIUM 200 MG/2ML IV SOLN
INTRAVENOUS | Status: DC | PRN
  Administered 2021-02-17: 100 mg via INTRAVENOUS
  Administered 2021-02-17: 300 mg via INTRAVENOUS

## 2021-02-17 NOTE — Progress Notes (Signed)
PROGRESS NOTE  Shane Fleming JYN:[id number] DOB: [birth year] DOA: 02/11/2021 PCP: Claiborne Rigg, NP   LOS: 6 days   Brief narrative: Shane Fleming is a 50 y.o. male with medical history of sinusitis presented to the hospital with cough and shortness of breath for 3 weeks with productive sputum, wheezing and weight loss of around 32 pounds.  Denied any history of smoking.  Patient has not traveled recently outside of the [place] and has been living in [place] for 20 years and has history of pansinusitis.  He has been evaluated by ENT and pulmonary in the past.  In the ED, patient was noted to be hypoxic with 87% pulse ox on room air which improved with supplemental oxygen.  Labs were unremarkable.  He was given normal saline, IV fluids, albuterol, Solu-Medrol and patient was admitted to hospital.    During hospitalization, patient was given antibiotics and steroids and pulmonary consultation was made.  He was subsequently put on airborne precautions and AFB sputum was ordered.   Assessment/Plan:  Principal Problem:   Acute respiratory failure with hypoxia and hypercapnia (HCC) Active Problems:   Bronchiectasis (HCC)   Weight loss   Elevated total protein   Protein-calorie malnutrition, severe  Acute respiratory failure with hypoxia and hypercapnia  Feels better with cough. Likely secondary to bronchiectasis with exacerbation: Pulmonary on board.  Currently on airborne isolation.  Ruling out PTB as well.  Chest x-ray shows scattered groundglass opacities with some bronchiectasis.   Continue incentive spirometry flutter valve.  Continue Rocephin and doxycycline and steroids.  MRSA PCR negative.  Fungal culture negative in so far.  Gram stain of the sputum showed abundant WBCs with gram-positive cocci and rods.  Respiratory viral panel negative so far.  Patient does not have leukocytosis or fever. AFB stain x 1 negative. Pulmonary planning for bronchoscopy today.  Weight loss:  Unintentional  due to lack of appetite.  Reported 32 pound weight loss over the last 3 weeks.  Could be secondary to bronchiectasis.  Rule out pulmonary TB.  TSH within normal limits. hepatitis panel negative.    Hypokalemia.  improved after repletion.  Chronic sinusitis.  Continue symptomatic treatment.   DVT prophylaxis: enoxaparin (LOVENOX) injection 40 mg Start: 02/11/21 2000   Code Status: Full code  Family Communication: None  Status is: Inpatient  Remains inpatient appropriate because:Ongoing diagnostic testing needed not appropriate for outpatient work up, IV treatments appropriate due to intensity of illness or inability to take PO, and Inpatient level of care appropriate due to severity of illness  Dispo: The patient is from: Home              Anticipated d/c is to: Home              Patient currently is not medically stable to d/c.   Difficult to place patient No  Consultants: Pulmonary  Procedures: None  Anti-infectives:  Doxycycline and Rocephin.  Anti-infectives (From admission, onward)    Start     Dose/Rate Route Frequency Ordered Stop   02/12/21 1415  cefTRIAXone (ROCEPHIN) 1 g in sodium chloride 0.9 % 100 mL IVPB        1 g 200 mL/hr over 30 Minutes Intravenous Every 24 hours 02/12/21 1319     02/12/21 1415  doxycycline (VIBRA-TABS) tablet 100 mg        100 mg Oral Every 12 hours 02/12/21 1319        Subjective: Today, patient was seen and examined at bedside. Has  cough but better today, better with breathing.   Objective: Vitals:   02/16/21 2032 02/17/21 0733  BP:  113/82  Pulse:  (!) 101  Resp:  18  Temp:  98.5 F (36.9 C)  SpO2: 98% 93%    Intake/Output Summary (Last 24 hours) at 02/17/2021 0817 Last data filed at 02/16/2021 2316 Gross per 24 hour  Intake 780 ml  Output --  Net 780 ml    Filed Weights   02/16/21 2250  Weight: 61.7 kg   Body mass index is 18.97 kg/m.   Physical Exam: General: Patient is alert awake oriented, thinly built,  nasal canula oxygen,   HENT:   No scleral pallor or icterus noted. Oral mucosa is moist.  Chest:  rhonchi, crackles. CVS: S1 &S2 heard. No murmur.  Regular rate and rhythm. Abdomen: Soft, nontender, nondistended.  Bowel sounds are heard.   Extremities: No cyanosis, clubbing or edema.  Peripheral pulses are palpable. Psych: Alert, awake and oriented, normal mood CNS:  No cranial nerve deficits.  Power equal in all extremities.   Skin: Warm and dry.  No rashes noted.  Data Review: I have personally reviewed the following laboratory data and studies,  CBC: Recent Labs  Lab 02/11/21 0748 02/11/21 1146 02/11/21 1530 02/13/21 0315 02/14/21 0532  WBC 8.7  --  6.8 10.7* 7.9  NEUTROABS  --   --  6.4  --   --   HGB 14.6 14.6 14.2 13.6 13.6  HCT 46.5 43.0 44.3 42.1 43.2  MCV 90.6  --  89.0 89.4 89.6  PLT 343  --  352 303 342    Basic Metabolic Panel: Recent Labs  Lab 02/11/21 0748 02/11/21 1146 02/12/21 0230 02/13/21 0315 02/14/21 0532  NA 137 139 138 135 138  K 3.6 3.8 3.4* 4.2 3.8  CL 99  --  100 99 99  CO2 30  --  32 28 32  GLUCOSE 102*  --  138* 93 98  BUN 19  --  15 12 13   CREATININE 1.26*  --  0.93 0.83 0.97  CALCIUM 9.5  --  9.3 9.3 9.2  MG  --   --  2.2 1.9 2.0  PHOS  --   --   --  3.9  --     Liver Function Tests: Recent Labs  Lab 02/11/21 0748 02/12/21 0230  AST 29 21  ALT 22 21  ALKPHOS 57 53  BILITOT 0.5 0.5  PROT 8.2* 7.7  ALBUMIN 3.3* 2.9*    No results for input(s): LIPASE, AMYLASE in the last 168 hours. No results for input(s): AMMONIA in the last 168 hours. Cardiac Enzymes: No results for input(s): CKTOTAL, CKMB, CKMBINDEX, TROPONINI in the last 168 hours. BNP (last 3 results) No results for input(s): BNP in the last 8760 hours.  ProBNP (last 3 results) No results for input(s): PROBNP in the last 8760 hours.  CBG: No results for input(s): GLUCAP in the last 168 hours. Recent Results (from the past 240 hour(s))  Resp Panel by RT-PCR (Flu  A&B, Covid) Nasopharyngeal Swab     Status: None   Collection Time: 02/11/21  7:43 AM   Specimen: Nasopharyngeal Swab; Nasopharyngeal(NP) swabs in vial transport medium  Result Value Ref Range Status   SARS Coronavirus 2 by RT PCR NEGATIVE NEGATIVE Final    Comment: (NOTE) SARS-CoV-2 target nucleic acids are NOT DETECTED.  The SARS-CoV-2 RNA is generally detectable in upper respiratory specimens during the acute phase of infection. The lowest  concentration of SARS-CoV-2 viral copies this assay can detect is 138 copies/mL. A negative result does not preclude SARS-Cov-2 infection and should not be used as the sole basis for treatment or other patient management decisions. A negative result may occur with  improper specimen collection/handling, submission of specimen other than nasopharyngeal swab, presence of viral mutation(s) within the areas targeted by this assay, and inadequate number of viral copies(<138 copies/mL). A negative result must be combined with clinical observations, patient history, and epidemiological information. The expected result is Negative.  Fact Sheet for Patients:  BloggerCourse.com  Fact Sheet for Healthcare Providers:  SeriousBroker.it  This test is no t yet approved or cleared by the [place] FDA and  has been authorized for detection and/or diagnosis of SARS-CoV-2 by FDA under an Emergency Use Authorization (EUA). This EUA will remain  in effect (meaning this test can be used) for the duration of the COVID-19 declaration under Section 564(b)(1) of the Act, 21 U.S.C.section 360bbb-3(b)(1), unless the authorization is terminated  or revoked sooner.       Influenza A by PCR NEGATIVE NEGATIVE Final   Influenza B by PCR NEGATIVE NEGATIVE Final    Comment: (NOTE) The Xpert Xpress SARS-CoV-2/FLU/RSV plus assay is intended as an aid in the diagnosis of influenza from Nasopharyngeal swab specimens  and should not be used as a sole basis for treatment. Nasal washings and aspirates are unacceptable for Xpert Xpress SARS-CoV-2/FLU/RSV testing.  Fact Sheet for Patients: BloggerCourse.com  Fact Sheet for Healthcare Providers: SeriousBroker.it  This test is not yet approved or cleared by the [place] FDA and has been authorized for detection and/or diagnosis of SARS-CoV-2 by FDA under an Emergency Use Authorization (EUA). This EUA will remain in effect (meaning this test can be used) for the duration of the COVID-19 declaration under Section 564(b)(1) of the Act, 21 U.S.C. section 360bbb-3(b)(1), unless the authorization is terminated or revoked.  Performed at [facility], [address], [place], [place] [address]   Culture, fungus without smear     Status: None (Preliminary result)   Collection Time: 02/11/21  4:28 PM   Specimen: Sputum; Other  Result Value Ref Range Status   Specimen Description SPU  Final   Special Requests NONE  Final   Culture   Final    NO FUNGUS ISOLATED AFTER 2 DAYS Performed at [facility], [address] [place], [place] [address]    Report Status PENDING  Incomplete  Acid Fast Smear (AFB)     Status: None   Collection Time: 02/14/21 10:25 AM   Specimen: Sputum  Result Value Ref Range Status   AFB Specimen Processing Concentration  Final   Acid Fast Smear Negative  Final    Comment: (NOTE) Performed At: [facility] [address] [place], [place] [id number] Jolene Schimke MD OZ:[id number]    Source (AFB) SPU  Final    Comment: Performed at [facility], [address], [place], [place] [address]  Expectorated Sputum Assessment w Gram Stain, Rflx to Resp Cult     Status: None   Collection Time: 02/14/21 11:20 AM   Specimen: Expectorated Sputum  Result Value Ref Range Status   Specimen Description EXPECTORATED SPUTUM  Final   Special Requests NONE  Final    Sputum evaluation   Final    THIS SPECIMEN IS ACCEPTABLE FOR SPUTUM CULTURE Performed at [facility], [address] [place], [place] [address]    Report Status  02/14/2021 FINAL  Final  Respiratory (~20 pathogens) panel by PCR     Status: None   Collection Time: 02/14/21 11:20 AM   Specimen: Nasopharyngeal Swab; Respiratory  Result Value Ref Range Status   Adenovirus NOT DETECTED NOT DETECTED Final   Coronavirus 229E NOT DETECTED NOT DETECTED Final    Comment: (NOTE) The Coronavirus on the Respiratory Panel, DOES NOT test for the novel  Coronavirus (2019 nCoV)    Coronavirus HKU1 NOT DETECTED NOT DETECTED Final   Coronavirus NL63 NOT DETECTED NOT DETECTED Final   Coronavirus OC43 NOT DETECTED NOT DETECTED Final   Metapneumovirus NOT DETECTED NOT DETECTED Final   Rhinovirus / Enterovirus NOT DETECTED NOT DETECTED Final   Influenza A NOT DETECTED NOT DETECTED Final   Influenza B NOT DETECTED NOT DETECTED Final   Parainfluenza Virus 1 NOT DETECTED NOT DETECTED Final   Parainfluenza Virus 2 NOT DETECTED NOT DETECTED Final   Parainfluenza Virus 3 NOT DETECTED NOT DETECTED Final   Parainfluenza Virus 4 NOT DETECTED NOT DETECTED Final   Respiratory Syncytial Virus NOT DETECTED NOT DETECTED Final   Bordetella pertussis NOT DETECTED NOT DETECTED Final   Bordetella Parapertussis NOT DETECTED NOT DETECTED Final   Chlamydophila pneumoniae NOT DETECTED NOT DETECTED Final   Mycoplasma pneumoniae NOT DETECTED NOT DETECTED Final    Comment: Performed at [facility], [address] [place], [place] [address]  Culture, Respiratory w Gram Stain     Status: None (Preliminary result)   Collection Time: 02/14/21 11:20 AM  Result Value Ref Range Status   Specimen Description EXPECTORATED SPUTUM  Final   Special Requests NONE Reflexed from [id number]  Final   Gram Stain   Final    ABUNDANT WBC PRESENT, PREDOMINANTLY PMN FEW GRAM POSITIVE COCCI RARE GRAM NEGATIVE RODS    Culture   Final     RARE KLEBSIELLA PNEUMONIAE CULTURE REINCUBATED FOR BETTER GROWTH SUSCEPTIBILITIES TO FOLLOW Performed at [facility], [address] [place], [place] [address]    Report Status PENDING  Incomplete  MRSA Next Gen by PCR, Nasal     Status: None   Collection Time: 02/15/21 12:19 PM   Specimen: Sputum; Nasal Swab  Result Value Ref Range Status   MRSA by PCR Next Gen NOT DETECTED NOT DETECTED Final    Comment: (NOTE) The GeneXpert MRSA Assay (FDA approved for NASAL specimens only), is one component of a comprehensive MRSA colonization surveillance program. It is not intended to diagnose MRSA infection nor to guide or monitor treatment for MRSA infections. Test performance is not FDA approved in patients less than 52 years old. Performed at [facility], [address] [place], [place] [address]       Studies: ECHOCARDIOGRAM COMPLETE  Result Date: 02/16/2021    ECHOCARDIOGRAM REPORT   Patient Name:   Arletha PiliBDOUL Pro Date of Exam: 02/16/2021 Medical Rec #:  [id number]       Height:       71.0 in Accession #:    [id number]      Weight:       163.0 lb Date of Birth:  07/29/1970        BSA:          1.933 m Patient Age:    50 years        BP:           113/86 mmHg Patient Gender: M  HR:           105 bpm. Exam Location:  Inpatient Procedure: 2D Echo, Cardiac Doppler and Color Doppler Indications:    Respiratory failure with hypoxia (HCC) [956387]  History:        Patient has no prior history of Echocardiogram examinations.                 Covid 19 positive.  Sonographer:    Roosvelt Maser RDCS Referring Phys: [id number] Martina Sinner IMPRESSIONS  1. Left ventricular ejection fraction, by estimation, is 60 to 65%. The left ventricle has normal function. The left ventricle has no regional wall motion abnormalities. Left ventricular diastolic function could not be evaluated.  2. Right ventricular systolic function is normal. The right ventricular size is normal. There is  normal pulmonary artery systolic pressure.  3. The mitral valve is normal in structure. No evidence of mitral valve regurgitation. No evidence of mitral stenosis.  4. The aortic valve is tricuspid. Aortic valve regurgitation is not visualized. No aortic stenosis is present.  5. The inferior vena cava is normal in size with greater than 50% respiratory variability, suggesting right atrial pressure of 3 mmHg. FINDINGS  Left Ventricle: Left ventricular ejection fraction, by estimation, is 60 to 65%. The left ventricle has normal function. The left ventricle has no regional wall motion abnormalities. The left ventricular internal cavity size was normal in size. There is  no left ventricular hypertrophy. Left ventricular diastolic function could not be evaluated. Right Ventricle: The right ventricular size is normal. No increase in right ventricular wall thickness. Right ventricular systolic function is normal. There is normal pulmonary artery systolic pressure. The tricuspid regurgitant velocity is 2.05 m/s, and  with an assumed right atrial pressure of 3 mmHg, the estimated right ventricular systolic pressure is 19.8 mmHg. Left Atrium: Left atrial size was normal in size. Right Atrium: Right atrial size was normal in size. Pericardium: There is no evidence of pericardial effusion. Mitral Valve: The mitral valve is normal in structure. No evidence of mitral valve regurgitation. No evidence of mitral valve stenosis. Tricuspid Valve: The tricuspid valve is normal in structure. Tricuspid valve regurgitation is trivial. No evidence of tricuspid stenosis. Aortic Valve: The aortic valve is tricuspid. Aortic valve regurgitation is not visualized. No aortic stenosis is present. Pulmonic Valve: The pulmonic valve was normal in structure. Pulmonic valve regurgitation is not visualized. No evidence of pulmonic stenosis. Aorta: The aortic root is normal in size and structure. Venous: The inferior vena cava is normal in size with  greater than 50% respiratory variability, suggesting right atrial pressure of 3 mmHg. IAS/Shunts: No atrial level shunt detected by color flow Doppler.  LEFT VENTRICLE PLAX 2D LVIDd:         3.60 cm LV PW:         0.70 cm LV IVS:        0.60 cm  TRICUSPID VALVE TR Peak grad:   16.8 mmHg TR Vmax:        204.96 cm/s Chilton Si MD Electronically signed by Chilton Si MD Signature Date/Time: 02/16/2021/2:07:36 PM    Final      Joycelyn Das, MD  [facility] 02/17/2021  If 7PM-7AM, please contact night-coverage

## 2021-02-17 NOTE — Anesthesia Procedure Notes (Addendum)
Procedure Name: Intubation Date/Time: 02/17/2021 3:20 PM Performed by: Lavell Luster, CRNA Pre-anesthesia Checklist: Patient identified, Emergency Drugs available, Suction available, Patient being monitored and Timeout performed Patient Re-evaluated:Patient Re-evaluated prior to induction Oxygen Delivery Method: Circle system utilized Preoxygenation: Pre-oxygenation with 100% oxygen Induction Type: IV induction and Rapid sequence Laryngoscope Size: Mac, 4 and Glidescope Grade View: Grade I Tube type: Oral Tube size: 7.5 mm Number of attempts: 1 Airway Equipment and Method: Stylet and Video-laryngoscopy Placement Confirmation: ETT inserted through vocal cords under direct vision, positive ETCO2 and breath sounds checked- equal and bilateral Secured at: 22 cm Tube secured with: Tape Dental Injury: Teeth and Oropharynx as per pre-operative assessment

## 2021-02-17 NOTE — Telephone Encounter (Signed)
Please schedule patient for hospital follow up with me in 2-4 weeks for bronchiectasis.  Thank you, Shane Fleming

## 2021-02-17 NOTE — Progress Notes (Signed)
Received pt s/p bronchoscopy, pt remains alert/oriented in no apparent distress. It  was noted pt having a non -productive cough.

## 2021-02-17 NOTE — Op Note (Signed)
Bronchoscopy Procedure Note  Jakeim Sedore  [id number]  [birth year]  Date:02/17/21  Time:3:30 PM   Provider Performing:Saanya Zieske B Luc Shammas   Procedure(s):  Flexible bronchoscopy with bronchial alveolar lavage (88916)  Indication(s) Bronchiectasis  Consent Risks of the procedure as well as the alternatives and risks of each were explained to the patient and/or caregiver.  Consent for the procedure was obtained and is signed in the bedside chart  Anesthesia General   Time Out Verified patient identification, verified procedure, site/side was marked, verified correct patient position, special equipment/implants available, medications/allergies/relevant history reviewed, required imaging and test results available.   Sterile Technique Usual hand hygiene, masks, gowns, and gloves were used   Procedure Description Bronchoscope advanced through endotracheal tube and into airway.  Airways were examined down to subsegmental level with findings noted below.   Following diagnostic evaluation, BAL(s) performed in right middle lobe with normal saline and return of 59m fluid  Findings:  - copious amounts of purulent secretions in the right lower and left lower lobes - Normal appearing airways without inflammatory changes   Complications/Tolerance None; patient tolerated the procedure well. Chest X-ray is not needed post procedure.   EBL Minimal   Specimen(s) BAL samples for respiratory culture, fungal culture, AFB culture and cell count/differential

## 2021-02-17 NOTE — Transfer of Care (Signed)
Immediate Anesthesia Transfer of Care Note  Patient: Shane Fleming  Procedure(s) Performed: VIDEO BRONCHOSCOPY WITHOUT FLUORO BRONCHIAL WASHINGS  Patient Location: Endoscopy Unit  Anesthesia Type:General  Level of Consciousness: awake, alert  and sedated  Airway & Oxygen Therapy: Patient connected to face mask oxygen  Post-op Assessment: Post -op Vital signs reviewed and stable  Post vital signs: stable  Last Vitals:  Vitals Value Taken Time  BP    Temp    Pulse    Resp    SpO2      Last Pain:  Vitals:   02/17/21 1446  TempSrc: Oral  PainSc: 0-No pain         Complications: No notable events documented.

## 2021-02-17 NOTE — Progress Notes (Signed)
Per report from endo RN. Its normal for pt to cough s/p bronchoscopy.

## 2021-02-17 NOTE — Progress Notes (Signed)
Pt left unit for bronchoscopy via stretcher accompanied by RN

## 2021-02-17 NOTE — Telephone Encounter (Signed)
LMTCB  Appt made for 03/23/21 (first available) at 9am.

## 2021-02-17 NOTE — Progress Notes (Addendum)
NAMEOsbaldo Fleming, MRN:  [id number], DOB:  1970/08/18, LOS: 6 ADMISSION DATE:  02/11/2021, CONSULTATION DATE:  02/11/21 REFERRING MD:  Madelyn Flavors CHIEF COMPLAINT:  cough and shortness of breath   History of Present Illness:  50yM never smoker from [place] with chronic cough on whom we are asked to consult for abnormal CT Chest. He says he's had a cough for at least 3-4 years maybe longer. It is productive. Fills up half a cup of sputum over the course of the day, yellowish. He takes no inhalers. Over the last 3 weeks devleoped worsening dyspnea, anorexia, weight loss, fatigue. No fever, drenching night sweats.   Father had asthma. No other lung issues in the family.  No other exposures elicited. Moved to [place] 20 years ago, hasn't traveled back any time recently.  Pertinent  Medical History  Never smoker Chronic cough previously followed by Dr. Sherene Sires Chronic sinusitis  Significant Hospital Events: Including procedures, antibiotic start and stop dates in addition to other pertinent events   8/12 admitted, PCCM consulted  Interim History / Subjective:   Patient continues to feel even better today. Breathing is a bit better as well. Coughing up less sputum.  Objective   Blood pressure 133/90, pulse (!) 102, temperature 97.8 F (36.6 C), temperature source Oral, resp. rate 17, height 5\' 11"  (1.803 m), weight 61.7 kg, SpO2 97 %.        Intake/Output Summary (Last 24 hours) at 02/17/2021 1503 Last data filed at 02/16/2021 2316 Gross per 24 hour  Intake 540 ml  Output --  Net 540 ml    Filed Weights   02/16/21 2250  Weight: 61.7 kg    Examination: General: thin male, no acute distress HEENT: No JVD lymphadenopathy is appreciated, sclera anicteric Neuro: Grossly intact without focal defect CV: RRR, no murmurs PULM: diminished breath sounds, scattered crackles GI: soft, non-tender, non-distended, BS + GU: deferred Extremities: warm/dry, negative edema  Skin: no rashes or  lesions   Resolved Hospital Problem list   N/a  Assessment & Plan:   # 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 His radiographic findings are also concerning for NTM infection but his clinical history of weight loss of 30lbs is concerning for MTB vs indolent bronchiectasis infection.  Plan:  - F/u AFB cultures -  ANA is negative. IgG levels are elevated.  - Continue airborne precautions - f/u quantiferon gold  - Continue prednisone - Continue brovana + yupelri nebulizer treatments. Add hypertonic nebs BID and continue flutter valve - Bronchoscopy today. Patient has been NPO. - Continue antibiotics for klebsiella pneumoniae pneumonia. Sensitivities have returned, when ready for discharge would recommend completing a total of 14 days of antibiotics with ciprofloxacin 500mg  BID.   Best Practice (right click and "Reselect all SmartList Selections" daily)   Diet/type: NPO, DVT prophylaxis: LMWH GI prophylaxis: N/A Lines: N/A Foley:  N/A Code Status:  full code Last date of multidisciplinary goals of care discussion [TBD]  Labs   CBC: Recent Labs  Lab 02/11/21 0748 02/11/21 1146 02/11/21 1530 02/13/21 0315 02/14/21 0532  WBC 8.7  --  6.8 10.7* 7.9  NEUTROABS  --   --  6.4  --   --   HGB 14.6 14.6 14.2 13.6 13.6  HCT 46.5 43.0 44.3 42.1 43.2  MCV 90.6  --  89.0 89.4 89.6  PLT 343  --  352 303 342    Basic Metabolic Panel: Recent Labs  Lab 02/11/21 0748 02/11/21 1146 02/12/21 0230 02/13/21 0315 02/14/21 0532  NA 137 139 138 135 138  K 3.6 3.8 3.4* 4.2 3.8  CL 99  --  100 99 99  CO2 30  --  32 28 32  GLUCOSE 102*  --  138* 93 98  BUN 19  --  15 12 13   CREATININE 1.26*  --  0.93 0.83 0.97  CALCIUM 9.5  --  9.3 9.3 9.2  MG  --    --  2.2 1.9 2.0  PHOS  --   --   --  3.9  --    GFR: Estimated Creatinine Clearance: 77.7 mL/min (by C-G formula based on SCr of 0.97 mg/dL). Recent Labs  Lab 02/11/21 0748 02/11/21 1530 02/13/21 0315 02/14/21 0532  PROCALCITON  --  <0.10  --   --   WBC 8.7 6.8 10.7* 7.9    Liver Function Tests: Recent Labs  Lab 02/11/21 0748 02/12/21 0230  AST 29 21  ALT 22 21  ALKPHOS 57 53  BILITOT 0.5 0.5  PROT 8.2* 7.7  ALBUMIN 3.3* 2.9*   No results for input(s): LIPASE, AMYLASE in the last 168 hours. No results for input(s): AMMONIA in the last 168 hours.  ABG    Component Value Date/Time   PHART 7.387 02/11/2021 1146   PCO2ART 52.7 (H) 02/11/2021 1146   PO2ART 45 (L) 02/11/2021 1146   HCO3 31.7 (H) 02/11/2021 1146   TCO2 33 (H) 02/11/2021 1146   O2SAT 80.0 02/11/2021 1146     Coagulation Profile: No results for input(s): INR, PROTIME in the last 168 hours.  Cardiac Enzymes: No results for input(s): CKTOTAL, CKMB, CKMBINDEX, TROPONINI in the last 168 hours.  HbA1C: No results found for: HGBA1C  CBG: No results for input(s): GLUCAP in the last 168 hours.  04/13/2021, MD [facility] Office: [phone]   See Amion for personal pager PCCM on call pager [phone] until 7pm. Please call Elink 7p-7a. [phone]

## 2021-02-17 NOTE — Anesthesia Preprocedure Evaluation (Addendum)
Anesthesia Evaluation  Patient identified by MRN, date of birth, ID band Patient awake    Reviewed: Allergy & Precautions, NPO status , Patient's Chart, lab work & pertinent test results  Airway Mallampati: II  TM Distance: >3 FB Neck ROM: Full    Dental   Several loose molars:   Pulmonary COPD,  Chronic cough bronchiectasis   Pulmonary exam normal breath sounds clear to auscultation       Cardiovascular negative cardio ROS Normal cardiovascular exam Rhythm:Regular Rate:Normal     Neuro/Psych negative neurological ROS  negative psych ROS   GI/Hepatic Neg liver ROS, Weight loss   Endo/Other  negative endocrine ROS  Renal/GU negative Renal ROS  negative genitourinary   Musculoskeletal negative musculoskeletal ROS (+)   Abdominal   Peds negative pediatric ROS (+)  Hematology negative hematology ROS (+)   Anesthesia Other Findings   Reproductive/Obstetrics negative OB ROS                            Anesthesia Physical Anesthesia Plan  ASA: 3  Anesthesia Plan: General   Post-op Pain Management:    Induction: Intravenous  PONV Risk Score and Plan: 2  Airway Management Planned: Oral ETT  Additional Equipment: None  Intra-op Plan:   Post-operative Plan: Extubation in OR  Informed Consent: I have reviewed the patients History and Physical, chart, labs and discussed the procedure including the risks, benefits and alternatives for the proposed anesthesia with the patient or authorized representative who has indicated his/her understanding and acceptance.     Dental advisory given  Plan Discussed with: CRNA, Anesthesiologist and Surgeon  Anesthesia Plan Comments: (R/o TB. Patient speaks fluent [place]. Advised that his loose teeth are at risk of becoming dislodged resulting in swallowing or aspiration. He expressed understanding. Tanna Furry, MD  )       Anesthesia Quick  Evaluation

## 2021-02-18 ENCOUNTER — Encounter (HOSPITAL_COMMUNITY): Payer: Self-pay | Admitting: Pulmonary Disease

## 2021-02-18 ENCOUNTER — Other Ambulatory Visit (HOSPITAL_COMMUNITY): Payer: Self-pay

## 2021-02-18 DIAGNOSIS — E43 Unspecified severe protein-calorie malnutrition: Secondary | ICD-10-CM

## 2021-02-18 LAB — CBC
HCT: 41.6 % (ref 39.0–52.0)
Hemoglobin: 13.4 g/dL (ref 13.0–17.0)
MCH: 28.6 pg (ref 26.0–34.0)
MCHC: 32.2 g/dL (ref 30.0–36.0)
MCV: 88.9 fL (ref 80.0–100.0)
Platelets: 337 10*3/uL (ref 150–400)
RBC: 4.68 MIL/uL (ref 4.22–5.81)
RDW: 13.2 % (ref 11.5–15.5)
WBC: 7.9 10*3/uL (ref 4.0–10.5)
nRBC: 0 % (ref 0.0–0.2)

## 2021-02-18 LAB — BASIC METABOLIC PANEL
Anion gap: 5 (ref 5–15)
BUN: 17 mg/dL (ref 6–20)
CO2: 33 mmol/L — ABNORMAL HIGH (ref 22–32)
Calcium: 8.7 mg/dL — ABNORMAL LOW (ref 8.9–10.3)
Chloride: 99 mmol/L (ref 98–111)
Creatinine, Ser: 1.16 mg/dL (ref 0.61–1.24)
GFR, Estimated: >60 mL/min (ref >60)
Glucose, Bld: 102 mg/dL — ABNORMAL HIGH (ref 70–99)
Potassium: 4 mmol/L (ref 3.5–5.1)
Sodium: 137 mmol/L (ref 135–145)

## 2021-02-18 LAB — OVA + PARASITE EXAM

## 2021-02-18 MED ORDER — MOMETASONE FURO-FORMOTEROL FUM 100-5 MCG/ACT IN AERO
2.0000 | INHALATION_SPRAY | Freq: Two times a day (BID) | RESPIRATORY_TRACT | 0 refills | Status: DC
  Filled 2021-02-18: qty 13, 30d supply, fill #0

## 2021-02-18 MED ORDER — ALBUTEROL SULFATE HFA 108 (90 BASE) MCG/ACT IN AERS
2.0000 | INHALATION_SPRAY | Freq: Four times a day (QID) | RESPIRATORY_TRACT | 2 refills | Status: DC | PRN
  Filled 2021-02-18: qty 18, 14d supply, fill #0

## 2021-02-18 MED ORDER — ALBUTEROL SULFATE (2.5 MG/3ML) 0.083% IN NEBU
3.0000 mL | INHALATION_SOLUTION | RESPIRATORY_TRACT | 12 refills | Status: DC | PRN
Start: 2021-02-18 — End: 2021-03-16
  Filled 2021-02-18: qty 90, 5d supply, fill #0

## 2021-02-18 MED ORDER — DEXTROMETHORPHAN-GUAIFENESIN 10-100 MG/5ML PO SYRP
5.0000 mL | ORAL_SOLUTION | ORAL | 0 refills | Status: DC | PRN
Start: 2021-02-18 — End: 2021-12-21
  Filled 2021-02-18: qty 118, 4d supply, fill #0

## 2021-02-18 MED ORDER — PREDNISONE 20 MG PO TABS
ORAL_TABLET | ORAL | 0 refills | Status: AC
  Filled 2021-02-18: qty 6, 4d supply, fill #0

## 2021-02-18 MED ORDER — CIPROFLOXACIN HCL 500 MG PO TABS
500.0000 mg | ORAL_TABLET | Freq: Two times a day (BID) | ORAL | 0 refills | Status: AC
  Filled 2021-02-18: qty 14, 7d supply, fill #0

## 2021-02-18 NOTE — TOC Transition Note (Addendum)
Transition of Care [facility]) - CM/SW Discharge Note   Patient Details  Name: Shane Fleming MRN: [id number] Date of Birth: 04/08/1971  Transition of [facility]) CM/SW Contact:  Epifanio Lesches, RN Phone Number: 02/18/2021, 12:02 PM   Clinical Narrative:    Patient will DC to: home Anticipated DC date: 02/18/2021 Family notified:yes Transport by: car     Acute respiratory failure with hypoxia and hypercapnia   Per MD patient ready for DC today. RN, and patient aware of d/c plan. Pt states lives alone. Friend will assist with care if need @ d/c. Pt states PTA independent with ADL's , no DME usage. Order noted for nebulizer, referral made with [employer] for charity care nebulizer. Pt without insurance. States works part-time. Income limited. Equipment will be delivered to bedsides prior to d/c.  Match Letter given to assist with med cost. Post hospital f/u noted on AVS:  [facility] [address] [phone] Jacky Kindle [phone] Next Steps: Follow up on 03/16/2021  Appointment: Instructions: 2:30 pm with Georgian Co PA  RNCM will sign off for now as intervention is no longer needed. Please consult [place] again if new needs arise.    Final next level of care: Home/Self Care Barriers to Discharge: No Barriers Identified   Patient Goals and CMS Choice     Choice offered to / list presented to : Patient  Discharge Placement     Discharge Plan and Services               DME Arranged: Nebulizer machine DME Agency: [facility] Date DME Agency Contacted: 02/18/21 Time DME Agency Contacted: [phone] Representative spoke with at DME Agency: Velna Hatchet            Social Determinants of Health (SDOH) Interventions     Readmission Risk Interventions No flowsheet data found.

## 2021-02-18 NOTE — Discharge Summary (Addendum)
Physician Discharge Summary  Shane Fleming XLK:[id number] DOB: 01-30-1971 DOA: 02/11/2021  PCP: Gildardo Pounds, NP  Admit date: 02/11/2021 Discharge date: 02/18/2021  Admitted From: Home  Discharge disposition: Home  Recommendations for Outpatient Follow-Up:   Follow up with your primary care provider [facility] on 03/16/2021 Check CBC, BMP, magnesium in the next visit Follow-up with pulmonary Dr. Roderic Palau as has been scheduled on 03/23/2021 at 9 AM Patient should be encouraged to use flutter valve and nebulizer treatment for adequate sputum clearance.  Discharge Diagnosis:   Principal Problem:   Acute respiratory failure with hypoxia and hypercapnia (HCC) Active Problems:   Bronchiectasis (HCC)   Weight loss   Protein-calorie malnutrition, severe   Discharge Condition: Improved.  Diet recommendation:  Regular.  Wound care: None.  Code status: Full.   History of Present Illness:   Shane Fleming is a 50 y.o. male with medical history of sinusitis presented to the hospital with cough and shortness of breath for 3 weeks with productive sputum, wheezing and weight loss of around 32 pounds.  Denied any history of smoking.  Patient has not traveled recently outside of the [place] and has been living in [place] for 20 years and has history of pansinusitis.  He has been evaluated by ENT and pulmonary in the past.  In the ED, patient was noted to be hypoxic with 87% pulse ox on room air which improved with supplemental oxygen.  Labs were unremarkable.  He was given normal saline, IV fluids, albuterol, Solu-Medrol and patient was admitted to hospital.     During hospitalization, patient was given antibiotics and steroids and pulmonary consultation was made.  He was subsequently put on airborne precautions and AFB sputum was ordered.   Hospital Course:   Following conditions were addressed during hospitalization as listed below,  Acute respiratory  failure with hypoxia and hypercapnia   Likely secondary to bronchiectasis with exacerbation: Patient was seen by pulmonary sputum cultures patient subsequently underwent bronchoscopy bronchoalveolar lavage with no reported cultures in less than 12 hours.  Chest x-ray had shown scattered groundglass opacities with some bronchiectasis.   Patient was continued on incentive spirometry and flutter valve during hospitalization including Rocephin doxycycline and steroids.  He was advised to use nebulizer at home with flutter valve for adequate sputum clearance.  Sputum culture showed some Klebsiella so patient will be given ciprofloxacin for 1 more week to complete total 2 weeks course of antibiotic.   No fungi were isolated from the specimen as well.  AFB stain x1 was negative.  Patient will follow further labs with pulmonary as outpatient.  Patient was initially put on isolation for concerns of acid-fast bacilli.    Weight loss with severe protein calorie malnutrition:  Unintentional due to lack of appetite.  Reported 32 pound weight loss over 3 weeks time.  Could be secondary to bronchiectasis.    TSH within normal limits. hepatitis panel negative.  Nutritional supplements were given during hospitalization.  Patient was encouraged to improve his nutrition.   Hypokalemia.   Resolved after replacement   Chronic sinusitis.   Stable at this time  Disposition.  At this time, patient is stable for disposition home with outpatient PCP and pulmonary follow-up.  Transition of care was consulted for medication assistance on discharge.  Medical Consultants:   Pulmonary  Procedures:    Bronchoscopy with bronchoalveolar lavage on 02/17/2021 Subjective:   Today, patient seen and examined at bedside.  Feels much better with  breathing cough and sputum production.  Feels much stronger and wishes to go home.  Discharge Exam:   Vitals:   02/18/21 0947 02/18/21 1153  BP:  112/82  Pulse:  100  Resp:  20   Temp:  98.5 F (36.9 C)  SpO2: 98% 92%   Vitals:   02/18/21 0341 02/18/21 0732 02/18/21 0947 02/18/21 1153  BP: 101/71 99/88  112/82  Pulse: 97 100  100  Resp: _0 Temp: 98.3 F (36.8 C) 99.3 F (37.4 C)  98.5 F (36.9 C)  TempSrc: Oral Oral  Oral  SpO2: 93% 99% 98% 92%  Weight:      Height:        General: Alert awake, not in obvious distress HENT: pupils equally reacting to light,  No scleral pallor or icterus noted. Oral mucosa is moist.  Chest:  Clear breath sounds.  Diminished breath sounds bilaterally. No crackles or wheezes.  CVS: S1 &S2 heard. No murmur.  Regular rate and rhythm. Abdomen: Soft, nontender, nondistended.  Bowel sounds are heard.   Extremities: No cyanosis, clubbing or edema.  Peripheral pulses are palpable. Psych: Alert, awake and oriented, normal mood CNS:  No cranial nerve deficits.  Power equal in all extremities.   Skin: Warm and dry.  No rashes noted.  The results of significant diagnostics from this hospitalization (including imaging, microbiology, ancillary and laboratory) are listed below for reference.     Diagnostic Studies:   DG Chest 2 View  Result Date: 02/11/2021 CLINICAL DATA:  Productive cough for 3 weeks EXAM: CHEST - 2 VIEW COMPARISON:  Chest radiograph 08/24/2017 FINDINGS: The cardiomediastinal silhouette is within normal limits. There are patchy opacities projecting over the spine on the lateral projection which are unchanged since 2018 which may reflect scarring. Slightly increased interstitial markings throughout are unchanged. There is no focal consolidation or pulmonary edema. There is no pleural effusion or pneumothorax. There is no acute osseous abnormality. IMPRESSION: Stable chest with no radiographic evidence of acute cardiopulmonary process. Electronically Signed   By: Valetta Mole MD   On: 02/11/2021 08:40   CT Angio Chest PE W and/or Wo Contrast  Result Date: 02/11/2021 CLINICAL DATA:  PE suspected.  High  probability. EXAM: CT ANGIOGRAPHY CHEST WITH CONTRAST TECHNIQUE: Multidetector CT imaging of the chest was performed using the standard protocol during bolus administration of intravenous contrast. Multiplanar CT image reconstructions and MIPs were obtained to evaluate the vascular anatomy. CONTRAST:  66m OMNIPAQUE IOHEXOL 350 MG/ML SOLN COMPARISON:  CT chest 06/13/2017. FINDINGS: Cardiovascular: Satisfactory opacification of the pulmonary arteries to the segmental level. No evidence of pulmonary embolism. Normal heart size. No pericardial effusion. Mediastinum/Nodes: No enlarged mediastinal, hilar, or axillary lymph nodes. Thyroid gland, trachea, and esophagus demonstrate no significant findings. Lungs/Pleura: There is diffuse bronchial wall thickening. Bilateral lower lobe predominant cylindrical bronchiectasis is identified. Mucoid impaction within posterior right lower lobe segmental and subsegmental airways noted. There is diffuse bilateral upper and lower lobe central lobular nodularity with scattered areas of upper lobe predominant ground-glass attenuation. A similar finding was seen on examination from 06/13/2017 but has progressed in the interval. Scattered patchy nodular areas of airspace density are also no including: Subpleural nodular density in the anterior right upper lobe measuring 9 mm, image 52/6. Part solid nodule within the periphery of the left upper lobe measures 7 mm, image 79/6. Subpleural nodule within the posterolateral right lower lobe measures 5 mm, image 125/6. Upper Abdomen: No acute abnormality. Musculoskeletal: No chest  wall abnormality. No acute or significant osseous findings. Review of the MIP images confirms the above findings. IMPRESSION: 1. No evidence for acute pulmonary embolus. 2. Diffuse bilateral upper and lower lobe predominant centrilobular nodularity with scattered areas of ground-glass attenuation. There is also mild diffuse cylindrical type bronchiectasis with areas  of lower lobe mucoid impaction. This appears progressive when compared with CT of the chest from 06/13/2017. Imaging findings are favored to represent an chronic progressive atypical inflammatory/infectious process. MAI (Mycobacterium avium intracellulare) is not excluded. Consider referral to pulmonary medicine for further management. 3. A few scattered, nonspecific, non centrilobular nodules are noted bilaterally. Recommend follow-up imaging with repeat CT of the chest in 3-6 months to assess temporal change in the appearance of these nodules. 4. Aortic atherosclerosis. Aortic Atherosclerosis (ICD10-I70.0). Electronically Signed   By: Kerby Moors M.D.   On: 02/11/2021 12:35     Labs:   Basic Metabolic Panel: Recent Labs  Lab 02/12/21 0230 02/13/21 0315 02/14/21 0532 02/18/21 0101  NA 138 135 138 137  K 3.4* 4.2 3.8 4.0  CL 100 99 99 99  CO2 32 28 32 33*  GLUCOSE 138* 93 98 102*  BUN _0 CREATININE 0.93 0.83 0.97 1.16  CALCIUM 9.3 9.3 9.2 8.7*  MG 2.2 1.9 2.0  --   PHOS  --  3.9  --   --    GFR Estimated Creatinine Clearance: 65 mL/min (by C-G formula based on SCr of 1.16 mg/dL). Liver Function Tests: Recent Labs  Lab 02/12/21 0230  AST 21  ALT 21  ALKPHOS 53  BILITOT 0.5  PROT 7.7  ALBUMIN 2.9*   No results for input(s): LIPASE, AMYLASE in the last 168 hours. No results for input(s): AMMONIA in the last 168 hours. Coagulation profile No results for input(s): INR, PROTIME in the last 168 hours.  CBC: Recent Labs  Lab 02/13/21 0315 02/14/21 0532 02/18/21 0101  WBC 10.7* 7.9 7.9  HGB 13.6 13.6 13.4  HCT 42.1 43.2 41.6  MCV 89.4 89.6 88.9  PLT 303 342 337   Cardiac Enzymes: No results for input(s): CKTOTAL, CKMB, CKMBINDEX, TROPONINI in the last 168 hours. BNP: Invalid input(s): POCBNP CBG: No results for input(s): GLUCAP in the last 168 hours. D-Dimer No results for input(s): DDIMER in the last 72 hours. Hgb A1c No results for input(s): HGBA1C  in the last 72 hours. Lipid Profile No results for input(s): CHOL, HDL, LDLCALC, TRIG, CHOLHDL, LDLDIRECT in the last 72 hours. Thyroid function studies No results for input(s): TSH, T4TOTAL, T3FREE, THYROIDAB in the last 72 hours.  Invalid input(s): FREET3 Anemia work up No results for input(s): VITAMINB12, FOLATE, FERRITIN, TIBC, IRON, RETICCTPCT in the last 72 hours. Microbiology Recent Results (from the past 240 hour(s))  Resp Panel by RT-PCR (Flu A&B, Covid) Nasopharyngeal Swab     Status: None   Collection Time: 02/11/21  7:43 AM   Specimen: Nasopharyngeal Swab; Nasopharyngeal(NP) swabs in vial transport medium  Result Value Ref Range Status   SARS Coronavirus 2 by RT PCR NEGATIVE NEGATIVE Final    Comment: (NOTE) SARS-CoV-2 target nucleic acids are NOT DETECTED.  The SARS-CoV-2 RNA is generally detectable in upper respiratory specimens during the acute phase of infection. The lowest concentration of SARS-CoV-2 viral copies this assay can detect is 138 copies/mL. A negative result does not preclude SARS-Cov-2 infection and should not be used as the sole basis for treatment or other patient management decisions. A negative result may occur with  improper specimen collection/handling, submission of specimen other than nasopharyngeal swab, presence of viral mutation(s) within the areas targeted by this assay, and inadequate number of viral copies(<138 copies/mL). A negative result must be combined with clinical observations, patient history, and epidemiological information. The expected result is Negative.  Fact Sheet for Patients:  EntrepreneurPulse.com.au  Fact Sheet for Healthcare Providers:  IncredibleEmployment.be  This test is no t yet approved or cleared by the [place] FDA and  has been authorized for detection and/or diagnosis of SARS-CoV-2 by FDA under an Emergency Use Authorization (EUA). This EUA will remain  in effect  (meaning this test can be used) for the duration of the COVID-19 declaration under Section 564(b)(1) of the Act, 21 U.S.C.section 360bbb-3(b)(1), unless the authorization is terminated  or revoked sooner.       Influenza A by PCR NEGATIVE NEGATIVE Final   Influenza B by PCR NEGATIVE NEGATIVE Final    Comment: (NOTE) The Xpert Xpress SARS-CoV-2/FLU/RSV plus assay is intended as an aid in the diagnosis of influenza from Nasopharyngeal swab specimens and should not be used as a sole basis for treatment. Nasal washings and aspirates are unacceptable for Xpert Xpress SARS-CoV-2/FLU/RSV testing.  Fact Sheet for Patients: EntrepreneurPulse.com.au  Fact Sheet for Healthcare Providers: IncredibleEmployment.be  This test is not yet approved or cleared by the [place] FDA and has been authorized for detection and/or diagnosis of SARS-CoV-2 by FDA under an Emergency Use Authorization (EUA). This EUA will remain in effect (meaning this test can be used) for the duration of the COVID-19 declaration under Section 564(b)(1) of the Act, 21 U.S.C. section 360bbb-3(b)(1), unless the authorization is terminated or revoked.  Performed at [facility], [place] [address], [place], [place] [address]   Culture, fungus without smear     Status: None (Preliminary result)   Collection Time: 02/11/21  4:28 PM   Specimen: Sputum; Other  Result Value Ref Range Status   Specimen Description SPU  Final   Special Requests NONE  Final   Culture   Final    No Fungi Isolated in 4 Weeks Performed at [facility], [address], [place], [place] [address]    Report Status PENDING  Incomplete  Acid Fast Smear (AFB)     Status: None   Collection Time: 02/14/21 10:25 AM   Specimen: Sputum  Result Value Ref Range Status   AFB Specimen Processing Concentration  Final   Acid Fast Smear Negative  Final    Comment: (NOTE) Performed At: [facility] [place], [place] [id number] Rush Farmer MD [id number]    Source (AFB) SPU  Final    Comment: Performed at [facility], [place] [address], [place], [place] [address]  Expectorated Sputum Assessment w Gram Stain, Rflx to Resp Cult     Status: None   Collection Time: 02/14/21 11:20 AM   Specimen: Expectorated Sputum  Result Value Ref Range Status   Specimen Description EXPECTORATED SPUTUM  Final   Special Requests NONE  Final   Sputum evaluation   Final    THIS SPECIMEN IS ACCEPTABLE FOR SPUTUM CULTURE Performed at [facility], [place] [address] [place], [place] [address]    Report Status 02/14/2021 FINAL  Final  Respiratory (~20 pathogens) panel by PCR     Status: None   Collection Time: 02/14/21 11:20 AM   Specimen: Nasopharyngeal Swab; Respiratory  Result Value Ref Range Status   Adenovirus NOT DETECTED NOT DETECTED  Final   Coronavirus 229E NOT DETECTED NOT DETECTED Final    Comment: (NOTE) The Coronavirus on the Respiratory Panel, DOES NOT test for the novel  Coronavirus (2019 nCoV)    Coronavirus HKU1 NOT DETECTED NOT DETECTED Final   Coronavirus NL63 NOT DETECTED NOT DETECTED Final   Coronavirus OC43 NOT DETECTED NOT DETECTED Final   Metapneumovirus NOT DETECTED NOT DETECTED Final   Rhinovirus / Enterovirus NOT DETECTED NOT DETECTED Final   Influenza A NOT DETECTED NOT DETECTED Final   Influenza B NOT DETECTED NOT DETECTED Final   Parainfluenza Virus 1 NOT DETECTED NOT DETECTED Final   Parainfluenza Virus 2 NOT DETECTED NOT DETECTED Final   Parainfluenza Virus 3 NOT DETECTED NOT DETECTED Final   Parainfluenza Virus 4 NOT DETECTED NOT DETECTED Final   Respiratory Syncytial Virus NOT DETECTED NOT DETECTED Final   Bordetella pertussis NOT DETECTED NOT DETECTED Final   Bordetella Parapertussis NOT DETECTED NOT DETECTED Final   Chlamydophila pneumoniae NOT DETECTED NOT DETECTED Final   Mycoplasma pneumoniae NOT DETECTED NOT DETECTED Final    Comment:  Performed at [facility], [place] [address] [place], [place] [address]  Culture, Respiratory w Gram Stain     Status: None   Collection Time: 02/14/21 11:20 AM  Result Value Ref Range Status   Specimen Description EXPECTORATED SPUTUM  Final   Special Requests NONE Reflexed from [id number]  Final   Gram Stain   Final    ABUNDANT WBC PRESENT, PREDOMINANTLY PMN FEW GRAM POSITIVE COCCI RARE GRAM NEGATIVE RODS Performed at [facility], [place] [address], [place], [place] [address]    Culture RARE KLEBSIELLA PNEUMONIAE  Final   Report Status 02/17/2021 FINAL  Final   Organism ID, Bacteria KLEBSIELLA PNEUMONIAE  Final      Susceptibility   Klebsiella pneumoniae - MIC*    AMPICILLIN >=32 RESISTANT Resistant     CEFAZOLIN <=4 SENSITIVE Sensitive     CEFEPIME <=0.12 SENSITIVE Sensitive     CEFTAZIDIME <=1 SENSITIVE Sensitive     CEFTRIAXONE <=0.25 SENSITIVE Sensitive     CIPROFLOXACIN <=0.25 SENSITIVE Sensitive     GENTAMICIN <=1 SENSITIVE Sensitive     IMIPENEM 1 SENSITIVE Sensitive     TRIMETH/SULFA >=320 RESISTANT Resistant     AMPICILLIN/SULBACTAM 8 SENSITIVE Sensitive     PIP/TAZO <=4 SENSITIVE Sensitive     * RARE KLEBSIELLA PNEUMONIAE  MRSA Next Gen by PCR, Nasal     Status: None   Collection Time: 02/15/21 12:19 PM   Specimen: Sputum; Nasal Swab  Result Value Ref Range Status   MRSA by PCR Next Gen NOT DETECTED NOT DETECTED Final    Comment: (NOTE) The GeneXpert MRSA Assay (FDA approved for NASAL specimens only), is one component of a comprehensive MRSA colonization surveillance program. It is not intended to diagnose MRSA infection nor to guide or monitor treatment for MRSA infections. Test performance is not FDA approved in patients less than 7 years old. Performed at [facility], [place] [address] [place], [place] [address]   Aerobic/Anaerobic Culture w Gram Stain (surgical/deep wound)     Status: None (Preliminary result)   Collection Time: 02/17/21  3:22 PM    Specimen: Bronchial Alveolar Lavage; Respiratory  Result Value Ref Range Status   Specimen Description BRONCHIAL ALVEOLAR LAVAGE  Final   Special Requests NONE  Final   Gram Stain   Final    NO ORGANISMS SEEN SQUAMOUS EPITHELIAL CELLS PRESENT ABUNDANT WBC PRESENT,BOTH PMN AND  MONONUCLEAR NO ORGANISMS SEEN BACTERIA    Culture   Final    NO GROWTH < 12 HOURS Performed at [place] [address], [place], [place] [address]    Report Status PENDING  Incomplete     Discharge Instructions:   Discharge Instructions     Diet - low sodium heart healthy   Complete by: As directed    Discharge instructions   Complete by: As directed    Follow-up with your primary care clinic as has been scheduled.  Follow-up with pulmonary clinic as well on the scheduled date.  Continue the course of antibiotic and prednisone.  Continue inhalers at home.  Seek medical attention for worsening symptoms.  Do not overexert. Use flutter valve twice a day after the bronchodilator treatment   Increase activity slowly   Complete by: As directed       Allergies as of 02/18/2021   No Known Allergies      Medication List     TAKE these medications    albuterol (2.5 MG/3ML) 0.083% nebulizer solution Commonly known as: PROVENTIL Inhale 3 mLs into the lungs every 4 (four) hours as needed for wheezing or shortness of breath.   albuterol 108 (90 Base) MCG/ACT inhaler Commonly known as: VENTOLIN HFA Inhale 2 puffs into the lungs every 6 (six) hours as needed for wheezing or shortness of breath (when travelling).   ciprofloxacin 500 MG tablet Commonly known as: Cipro Take 1 tablet (500 mg total) by mouth 2 (two) times daily for 7 days.   Cough/Chest Congestion DM 10-100 MG/5ML liquid Generic drug: Dextromethorphan-guaiFENesin Take 5 mLs by mouth every 4 (four) hours as needed for cough.   Dulera 100-5 MCG/ACT Aero Generic drug: mometasone-formoterol Inhale 2 puffs into the lungs in the  morning and at bedtime.   predniSONE 20 MG tablet Commonly known as: DELTASONE Take 2 tablets (40 mg total) by mouth daily with breakfast for 2 days, THEN 1 tablet (20 mg total) daily with breakfast for 2 days. Start taking on: February 19, 2021               Durable Medical Equipment  (From admission, onward)           Start     Ordered   02/18/21 3536  For home use only DME Nebulizer machine  Once       Question Answer Comment  Patient needs a nebulizer to treat with the following condition Bronchiectasis ([place])   Length of Need 12 Months      02/18/21 1443            Follow-up Information     Freddi Starr, MD. Go on 03/23/2021.   Specialty: Pulmonary Disease Why: 03/23/21  at 9am. Contact information: [address] [place] [address] [place] Follow up on 03/16/2021.   Why: 2:30 pm with Freeman Caldron PA Contact information: [address] [place] [address] [phone]                 Time coordinating discharge: 39 minutes  Signed:  Shabnam Ladd  [facility] 02/18/2021, 5:18 PM

## 2021-02-18 NOTE — Anesthesia Postprocedure Evaluation (Signed)
Anesthesia Post Note  Patient: Shane Fleming  Procedure(s) Performed: VIDEO BRONCHOSCOPY WITHOUT FLUORO BRONCHIAL WASHINGS     Patient location during evaluation: PACU Anesthesia Type: General Level of consciousness: awake Pain management: pain level controlled Vital Signs Assessment: post-procedure vital signs reviewed and stable Respiratory status: spontaneous breathing, nonlabored ventilation and respiratory function stable Cardiovascular status: blood pressure returned to baseline and stable Postop Assessment: no apparent nausea or vomiting Anesthetic complications: no   No notable events documented.  Last Vitals:  Vitals:   02/18/21 0732 02/18/21 0947  BP: 99/88   Pulse: 100   Resp: 17   Temp: 37.4 C   SpO2: 99% 98%    Last Pain:  Vitals:   02/18/21 0900  TempSrc:   PainSc: 0-No pain                 Mellody Dance

## 2021-02-18 NOTE — Progress Notes (Addendum)
   [name], MRN:  [id number], DOB:  1970-09-25, LOS: 7 ADMISSION DATE:  02/11/2021, CONSULTATION DATE:  02/11/21 REFERRING MD:  Fuller Plan CHIEF COMPLAINT:  cough and shortness of breath   History of Present Illness:  50yM never smoker from [place] with chronic cough on whom we are asked to consult for abnormal CT Chest. He says he's had a cough for at least 3-4 years maybe longer. It is productive. Fills up half a cup of sputum over the course of the day, yellowish. He takes no inhalers. Over the last 3 weeks devleoped worsening dyspnea, anorexia, weight loss, fatigue. No fever, drenching night sweats.   Father had asthma. No other lung issues in the family.  No other exposures elicited. Moved to [place] 20 years ago, hasn't traveled back any time recently.  Pertinent  Medical History  Never smoker Chronic cough previously followed by Dr. Melvyn Novas Chronic sinusitis  Significant Hospital Events: Including procedures, antibiotic start and stop dates in addition to other pertinent events   8/12 admitted, PCCM consulted 8/18 bronch/bal  Interim History / Subjective:   Feeling overall better, still with productive cough, hasn't been using flutter valve much.  Objective   Blood pressure 112/82, pulse 100, temperature 98.5 F (36.9 C), temperature source Oral, resp. rate 20, height _0  (1.803 m), weight 61.7 kg, SpO2 92 %.        Intake/Output Summary (Last 24 hours) at 02/18/2021 1236 Last data filed at 02/18/2021 0600 Gross per 24 hour  Intake 1586.25 ml  Output --  Net 1586.25 ml    Filed Weights   02/16/21 2250  Weight: 61.7 kg    Examination: General: thin male, no acute distress HEENT: No JVD lymphadenopathy is appreciated, sclera anicteric Neuro: Grossly intact without focal defect CV: RRR, no murmurs PULM: diminished breath sounds with expiratory squeaks, scattered crackles GI: soft, non-tender, non-distended, BS + GU: deferred Extremities: warm, negative  edema  Skin: diaphoretic  Labs reviewed, BAL diff neutrophil predominant, AFBs pending  Resolved Hospital Problem list   N/a  Assessment & Plan:   # Acute on chronic hypercapnic and hypoxemic respiratory failure # Nodular bronchiectasis with mucoid impaction: # Scattered nodular infiltrates # Pneumonia due to Klebsiella   Patient's presentation is likely secondary to bronchiectasis exacerbation due to klebsiella pneumonia. His radiographic findings are also concerning for NTM infection but his clinical history of weight loss of 30lbs is concerning for MTB vs indolent bronchiectasis infection. Lab workup for etiology of bronchiectasis has been unrevealing to date.   Plan:  - F/u AFB cultures, would send home with a surgical mask to wear around others at least until smears are negative  - Continue airborne precautions - f/u quantiferon gold  - Agree with prednisone taper - send home with dulera or equivalent LABA/ICS - encourage flutter valve use as follows following long acting bronchodilator treatment: flutter valve 10 slow but firm puffs twice daily  - Continue antibiotics for klebsiella pneumoniae pneumonia. Sensitivities have returned, when ready for discharge would recommend completing a total of 14 days of antibiotics with ciprofloxacin 520m BID.  - Arranging clinic follow up  Best Practice (right click and "Reselect all SmartList Selections" daily)   Diet/type: NPO, DVT prophylaxis: LMWH GI prophylaxis: N/A Lines: N/A Foley:  N/A Code Status:  full code Last date of multidisciplinary goals of care discussion [TBD]  NPennwyn

## 2021-02-19 LAB — QUANTIFERON-TB GOLD PLUS: QuantiFERON-TB Gold Plus: NEGATIVE

## 2021-02-19 LAB — QUANTIFERON-TB GOLD PLUS (RQFGPL)
QuantiFERON Mitogen Value: 2.33 IU/mL
QuantiFERON Nil Value: 0 IU/mL
QuantiFERON TB1 Ag Value: 0.05 IU/mL
QuantiFERON TB2 Ag Value: 0.03 IU/mL

## 2021-02-20 LAB — ACID FAST SMEAR (AFB, MYCOBACTERIA): Acid Fast Smear: NEGATIVE

## 2021-02-21 ENCOUNTER — Telehealth: Payer: Self-pay

## 2021-02-21 NOTE — Telephone Encounter (Signed)
Transition Care Management Follow-up Telephone Call  Call placed with [place] [employer] # [id number]/[employer]. It was noted in Epic that preferred language was [place].  Patient then said that he prefers [place].  Date of discharge and from where: 02/18/2021, [facility]  How have you been since you were released from the hospital? He said he feels better.  Any questions or concerns? No  Items Reviewed: Did the pt receive and understand the discharge instructions provided? Yes  Medications obtained and verified? Yes  - he has all medications and did not have any questions about the med regime.  Other? No  Any new allergies since your discharge? No  Do you have support at home?  Lives alone  , has friend who can provide transportation.   Home Care and Equipment/Supplies: Were home health services ordered? no If so, what is the name of the agency? N/a  Has the agency set up a time to come to the patient's home? not applicable Were any new equipment or medical supplies ordered?  Yes: nebulizer, flutter device What is the name of the medical supply agency? [facility] Were you able to get the supplies/equipment? yes Do you have any questions related to the use of the equipment or supplies? No - he said he was shown how to use both devices before he left the hospital   Functional Questionnaire: (I = Independent and D = Dependent) ADLs: independent.   Follow up appointments reviewed:  PCP Hospital f/u appt confirmed? Yes  Scheduled to see Rose Phi Clung, PA  on 03/16/2021, He did not want to schedule an appointment to be seen sooner. He will need to re-establish care at a [facility].   Specialist Hospital f/u appt confirmed? Yes  Scheduled to see pulmonary on 03/23/2021.  Are transportation arrangements needed? No  If their condition worsens, is the pt aware to call PCP or go to the Emergency Dept.? Yes Was the patient provided with contact  information for the PCP's office or ED? Yes Was to pt encouraged to call back with questions or concerns? Yes

## 2021-02-22 LAB — AEROBIC/ANAEROBIC CULTURE W GRAM STAIN (SURGICAL/DEEP WOUND): Culture: NORMAL

## 2021-02-23 LAB — ACID FAST SMEAR (AFB, MYCOBACTERIA): Acid Fast Smear: NEGATIVE

## 2021-03-01 LAB — ACID FAST SMEAR (AFB, MYCOBACTERIA): Acid Fast Smear: NEGATIVE

## 2021-03-07 LAB — CULTURE, FUNGUS WITHOUT SMEAR

## 2021-03-16 ENCOUNTER — Ambulatory Visit: Payer: MEDICAID | Attending: Physician Assistant | Admitting: Physician Assistant

## 2021-03-16 ENCOUNTER — Other Ambulatory Visit: Payer: Self-pay

## 2021-03-16 ENCOUNTER — Encounter: Payer: Self-pay | Admitting: Physician Assistant

## 2021-03-16 VITALS — BP 120/85 | HR 102 | Ht 71.0 in | Wt 144.1 lb

## 2021-03-16 DIAGNOSIS — J471 Bronchiectasis with (acute) exacerbation: Secondary | ICD-10-CM

## 2021-03-16 DIAGNOSIS — R634 Abnormal weight loss: Secondary | ICD-10-CM

## 2021-03-16 DIAGNOSIS — Z09 Encounter for follow-up examination after completed treatment for conditions other than malignant neoplasm: Secondary | ICD-10-CM

## 2021-03-16 DIAGNOSIS — J9601 Acute respiratory failure with hypoxia: Secondary | ICD-10-CM

## 2021-03-16 DIAGNOSIS — J9602 Acute respiratory failure with hypercapnia: Secondary | ICD-10-CM

## 2021-03-16 DIAGNOSIS — E43 Unspecified severe protein-calorie malnutrition: Secondary | ICD-10-CM

## 2021-03-16 MED ORDER — ALBUTEROL SULFATE (2.5 MG/3ML) 0.083% IN NEBU
3.0000 mL | INHALATION_SOLUTION | RESPIRATORY_TRACT | 12 refills | Status: DC | PRN
  Filled 2021-03-16 – 2021-07-18 (×2): qty 90, 5d supply, fill #0

## 2021-03-16 MED ORDER — PREDNISONE 10 MG PO TABS
10.0000 mg | ORAL_TABLET | Freq: Every day | ORAL | 0 refills | Status: DC
  Filled 2021-03-16: qty 21, 6d supply, fill #0

## 2021-03-16 MED ORDER — MOMETASONE FURO-FORMOTEROL FUM 100-5 MCG/ACT IN AERO
2.0000 | INHALATION_SPRAY | Freq: Two times a day (BID) | RESPIRATORY_TRACT | 2 refills | Status: DC
  Filled 2021-03-16: qty 13, 30d supply, fill #0

## 2021-03-16 MED ORDER — ALBUTEROL SULFATE HFA 108 (90 BASE) MCG/ACT IN AERS
2.0000 | INHALATION_SPRAY | Freq: Four times a day (QID) | RESPIRATORY_TRACT | 2 refills | Status: DC | PRN
  Filled 2021-03-16: qty 18, 14d supply, fill #0

## 2021-03-16 NOTE — Progress Notes (Signed)
Patient ID: Shane Fleming, male   DOB: 11-11-1970, 50 y.o.   MRN: [id number]       Shane Fleming, is a 50 y.o. male  BPZ:[id number]  EUM:[id number]  DOB - 1970-08-19  Chief Complaint  Patient presents with   Hospitalization Follow-up       Subjective:  After hospitalization 8/12-8/19/2022 with acute resp failure and bronchiectasis with weight loss.  He has gained back about 10 ounds.  He is feeling much better.  He is still wheezing and SOB but has O2 at home and is only using inhalers at night time.  No fevers.  Has pulmonary appt 9/21.  From discharge summary: Principal Problem:   Acute respiratory failure with hypoxia and hypercapnia (HCC) Active Problems:   Bronchiectasis (HCC)   Weight loss   Protein-calorie malnutrition, severe   Shane Fleming is a 50 y.o. male with medical history of sinusitis presented to the hospital with cough and shortness of breath for 3 weeks with productive sputum, wheezing and weight loss of around 32 pounds.  Denied any history of smoking.  Patient has not traveled recently outside of the [place] and has been living in [place] for 20 years and has history of pansinusitis.  He has been evaluated by ENT and pulmonary in the past.  In the ED, patient was noted to be hypoxic with 87% pulse ox on room air which improved with supplemental oxygen.  Labs were unremarkable.  He was given normal saline, IV fluids, albuterol, Solu-Medrol and patient was admitted to hospital.     During hospitalization, patient was given antibiotics and steroids and pulmonary consultation was made.  He was subsequently put on airborne precautions and AFB sputum was ordered.  Hospital course: Acute respiratory failure with hypoxia and hypercapnia   Likely secondary to bronchiectasis with exacerbation: Patient was seen by pulmonary sputum cultures patient subsequently underwent bronchoscopy bronchoalveolar lavage with no reported cultures in less than 12 hours.  Chest x-ray had  shown scattered groundglass opacities with some bronchiectasis.   Patient was continued on incentive spirometry and flutter valve during hospitalization including Rocephin doxycycline and steroids.  He was advised to use nebulizer at home with flutter valve for adequate sputum clearance.  Sputum culture showed some Klebsiella so patient will be given ciprofloxacin for 1 more week to complete total 2 weeks course of antibiotic.   No fungi were isolated from the specimen as well.  AFB stain x1 was negative.  Patient will follow further labs with pulmonary as outpatient.  Patient was initially put on isolation for concerns of acid-fast bacilli.     Weight loss with severe protein calorie malnutrition:  Unintentional due to lack of appetite.  Reported 32 pound weight loss over 3 weeks time.  Could be secondary to bronchiectasis.    TSH within normal limits. hepatitis panel negative.  Nutritional supplements were given during hospitalization.  Patient was encouraged to improve his nutrition.   Hypokalemia.   Resolved after replacement   Chronic sinusitis.   Stable at this time   Disposition.  At this time, patient is stable for disposition home with outpatient PCP and pulmonary follow-up.  Transition of care was consulted for medication assistance on discharge. Shane Fleming is a 50 y.o. male here today for a follow up visit  and to establish care. Patient has No headache, No chest pain, No abdominal pain - No Nausea, No new weakness tingling or numbness, still with some cough and wheezing  No problems updated.  ALLERGIES: No Known  Allergies  PAST MEDICAL HISTORY: Past Medical History:  Diagnosis Date   Sinusitis 2018    MEDICATIONS AT HOME: Prior to Admission medications   Medication Sig Start Date End Date Taking? Authorizing Provider  Dextromethorphan-guaiFENesin 10-100 MG/5ML liquid Take 5 mLs by mouth every 4 (four) hours as needed for cough. 02/18/21  Yes Pokhrel, Corrie Mckusick, MD   predniSONE (DELTASONE) 10 MG tablet Take 1 tablet (10 mg total) by mouth daily with breakfast. 6,5,4,3,2,1 take each days dose with food all at once 03/16/21  Yes Thang Flett M, PA-C  albuterol (PROVENTIL) (2.5 MG/3ML) 0.083% nebulizer solution Inhale 3 mLs into the lungs every 4 (four) hours as needed for wheezing or shortness of breath. 03/16/21   Argentina Donovan, PA-C  albuterol (VENTOLIN HFA) 108 (90 Base) MCG/ACT inhaler Inhale 2 puffs into the lungs every 6 (six) hours as needed for wheezing or shortness of breath (when travelling). 03/16/21   Argentina Donovan, PA-C  mometasone-formoterol (DULERA) 100-5 MCG/ACT AERO Inhale 2 puffs into the lungs in the morning and at bedtime. 03/16/21 04/15/21  Argentina Donovan, PA-C    ROS: Neg HEENT Neg cardiac Neg GI Neg GU Neg MS Neg psych Neg neuro  Objective:   Vitals:   03/16/21 1454  BP: 120/85  Pulse: (!) 102  SpO2: 93%  Weight: 144 lb 2 oz (65.4 kg)  Height: _0  (1.803 m)   Exam General appearance : Awake, alert, not in any distress. Speech Clear. Not toxic looking HEENT: Atraumatic and Normocephalic Neck: Supple, no JVD. No cervical lymphadenopathy.  Chest: fair air entry bilaterally, +mild wheezing throughout.  No rales or rhonchi CVS: S1 S2 regular, no murmurs.  Abdomen: Bowel sounds present, Non tender and not distended with no gaurding, rigidity or rebound. Extremities: B/L Lower Ext shows no edema, both legs are warm to touch Neurology: Awake alert, and oriented X 3, CN II-XII intact, Non focal Skin: No Rash  Data Review No results found for: HGBA1C  Assessment & Plan   1. Acute respiratory failure with hypoxia and hypercapnia (HCC) Improving.  I have instructed him to use all inhalers as directed and not just at night.  Her verbalized understanding - albuterol (VENTOLIN HFA) 108 (90 Base) MCG/ACT inhaler; Inhale 2 puffs into the lungs every 6 (six) hours as needed for wheezing or shortness of breath (when  travelling).  Dispense: 18 g; Refill: 2 - albuterol (PROVENTIL) (2.5 MG/3ML) 0.083% nebulizer solution; Inhale 3 mLs into the lungs every 4 (four) hours as needed for wheezing or shortness of breath.  Dispense: 90 mL; Refill: 12 - mometasone-formoterol (DULERA) 100-5 MCG/ACT AERO; Inhale 2 puffs into the lungs in the morning and at bedtime.  Dispense: 13 g; Refill: 2 - Comprehensive metabolic panel - CBC with Differential/Platelet  2. Bronchiectasis with acute exacerbation ([place]) See #1 - mometasone-formoterol (DULERA) 100-5 MCG/ACT AERO; Inhale 2 puffs into the lungs in the morning and at bedtime.  Dispense: 13 g; Refill: 2 - predniSONE (DELTASONE) 10 MG tablet; Take 1 tablet (10 mg total) by mouth daily with breakfast. 6,5,4,3,2,1 take each days dose with food all at once  Dispense: 21 tablet; Refill: 0  3. Protein-calorie malnutrition, severe His appetite is improving and he is gaining weight back  4. Hospital discharge follow-up improving  5. Weight loss His appetite is improving and he is gaining weight back  Patient have been counseled extensively about nutrition and exercise. Other issues discussed during this visit include: low cholesterol diet, weight control and  daily exercise, foot care, annual eye examinations at Ophthalmology, importance of adherence with medications and regular follow-up. We also discussed long term complications of uncontrolled diabetes and hypertension.   Return in about 2 months (around 05/16/2021) for PCP;  chronnic conditions.  The patient was given clear instructions to go to ER or return to medical center if symptoms don't improve, worsen or new problems develop. The patient verbalized understanding. The patient was told to call to get lab results if they haven't heard anything in the next week.      Freeman Caldron, PA-C [facility] [place], [place]   03/16/2021, 3:19 PM

## 2021-03-17 LAB — COMPREHENSIVE METABOLIC PANEL WITH GFR
ALT: 24 IU/L (ref 0–44)
AST: 25 IU/L (ref 0–40)
Albumin/Globulin Ratio: 1.3 (ref 1.2–2.2)
Albumin: 4.3 g/dL (ref 3.8–4.9)
Alkaline Phosphatase: 71 IU/L (ref 44–121)
BUN/Creatinine Ratio: 10 (ref 9–20)
BUN: 12 mg/dL (ref 6–24)
Bilirubin Total: <0.2 mg/dL (ref 0.0–1.2)
CO2: 27 mmol/L (ref 20–29)
Calcium: 9.8 mg/dL (ref 8.7–10.2)
Chloride: 102 mmol/L (ref 96–106)
Creatinine, Ser: 1.19 mg/dL (ref 0.76–1.27)
Globulin, Total: 3.3 g/dL (ref 1.5–4.5)
Glucose: 143 mg/dL — ABNORMAL HIGH (ref 65–99)
Potassium: 5 mmol/L (ref 3.5–5.2)
Sodium: 146 mmol/L — ABNORMAL HIGH (ref 134–144)
Total Protein: 7.6 g/dL (ref 6.0–8.5)
eGFR: 73 mL/min/1.73

## 2021-03-17 LAB — CBC WITH DIFFERENTIAL/PLATELET
Basophils Absolute: 0 x10E3/uL (ref 0.0–0.2)
Basos: 0 %
EOS (ABSOLUTE): 0.2 x10E3/uL (ref 0.0–0.4)
Eos: 3 %
Hematocrit: 44.2 % (ref 37.5–51.0)
Hemoglobin: 14.6 g/dL (ref 13.0–17.7)
Immature Grans (Abs): 0 x10E3/uL (ref 0.0–0.1)
Immature Granulocytes: 0 %
Lymphocytes Absolute: 2.2 x10E3/uL (ref 0.7–3.1)
Lymphs: 46 %
MCH: 28 pg (ref 26.6–33.0)
MCHC: 33 g/dL (ref 31.5–35.7)
MCV: 85 fL (ref 79–97)
Monocytes Absolute: 0.5 x10E3/uL (ref 0.1–0.9)
Monocytes: 9 %
Neutrophils Absolute: 2.1 x10E3/uL (ref 1.4–7.0)
Neutrophils: 42 %
Platelets: 307 x10E3/uL (ref 150–450)
RBC: 5.21 x10E6/uL (ref 4.14–5.80)
RDW: 13.1 % (ref 11.6–15.4)
WBC: 5 x10E3/uL (ref 3.4–10.8)

## 2021-03-22 LAB — FUNGUS CULTURE WITH STAIN

## 2021-03-22 LAB — FUNGUS CULTURE RESULT

## 2021-03-22 LAB — FUNGAL ORGANISM REFLEX

## 2021-03-23 ENCOUNTER — Encounter: Payer: Self-pay | Admitting: Pulmonary Disease

## 2021-03-23 ENCOUNTER — Ambulatory Visit (INDEPENDENT_AMBULATORY_CARE_PROVIDER_SITE_OTHER): Payer: Self-pay | Admitting: Pulmonary Disease

## 2021-03-23 ENCOUNTER — Other Ambulatory Visit: Payer: Self-pay

## 2021-03-23 VITALS — BP 118/74 | HR 90 | Temp 97.3°F | Ht 73.0 in | Wt 145.2 lb

## 2021-03-23 DIAGNOSIS — J479 Bronchiectasis, uncomplicated: Secondary | ICD-10-CM

## 2021-03-23 DIAGNOSIS — K219 Gastro-esophageal reflux disease without esophagitis: Secondary | ICD-10-CM

## 2021-03-23 MED ORDER — PANTOPRAZOLE SODIUM 40 MG PO TBEC: 40.0000 mg | DELAYED_RELEASE_TABLET | Freq: Every day | ORAL | 6 refills | Status: DC

## 2021-03-23 NOTE — Progress Notes (Signed)
Synopsis: Referred in September 202 for bronchiectasis  Subjective:   PATIENT ID: Shane Fleming GENDER: male DOB: 1971/05/05, MRN: [id number]   HPI  Chief Complaint  Patient presents with   Follow-up    Slowly feeling better   Shane Fleming is a 50 year old male, never smoker with bronchiectasis who returns to pulmonary clinic for Fleming follow up for bronchiectasis exacerbation.   Sputum culture was positive for Klebsiella pneumonia from expectorated sputum sample.  He completed 14 days of fluoroquinolone therapy and is feeling much better.  His fungal and AFB cultures show no growth to date from a bronchoscopy with BAL on 02/17/2021.  CT chest scan from 02/11/2021 shows diffuse bronchial wall thickening and bilateral lower lobe predominant cylindrical bronchiectasis with mucoid impaction of the right lower airways.  He has multiple scattered pulmonary nodules measuring 5 to 9 mm in size.  He was discharged on home oxygen which she has stopped using over the past 1 week.  He was seen by his primary care provider 03/16/2021, note personally reviewed, and he was provided Shane Fleming inhaler 100-5 MCG 2 puffs twice daily along with as needed albuterol inhaler and nebulizer solution.  He does have a nebulizer machine at home along with flutter valve.  He is finishing a course of prednisone from his primary care.  He denies any issues with seasonal allergies.  He reports the cold air does bother his breathing in the wintertime.  He is a never smoker but reports secondhand smoke exposure from friends and relatives while growing up.  He is originally from [place].  He denies any significant exposure to charcoal or wood-burning stove's while growing up.  He denies any respiratory issues as a kid or in his younger years.  He reports he is only had a cough over the last 4 years.  Laboratory test reviewed from his hospitalization and they are negative for ANA, rheumatoid factor and ANCA testing.  IgG  level is increased at 2000 464 and IgG4 subclass elevated at 106.  IgE level 252.  Past Medical History:  Diagnosis Date   Sinusitis 2018     Family History  Problem Relation Age of Onset   Asthma Father      Social History   Socioeconomic History   Marital status: Single    Spouse name: Not on file   Number of children: Not on file   Years of education: Not on file   Highest education level: Not on file  Occupational History   Not on file  Tobacco Use   Smoking status: Never   Smokeless tobacco: Never  Substance and Sexual Activity   Alcohol use: No   Drug use: No   Sexual activity: Not Currently  Other Topics Concern   Not on file  Social History Narrative   Not on file   Social Determinants of Health   Financial Resource Strain: Not on file  Food Insecurity: Not on file  Transportation Needs: Not on file  Physical Activity: Not on file  Stress: Not on file  Social Connections: Not on file  Intimate Partner Violence: Not on file     No Known Allergies   Outpatient Medications Prior to Visit  Medication Sig Dispense Refill   albuterol (PROVENTIL) (2.5 MG/3ML) 0.083% nebulizer solution Inhale 3 mLs into the lungs every 4 (four) hours as needed for wheezing or shortness of breath. 90 mL 12   albuterol (VENTOLIN HFA) 108 (90 Base) MCG/ACT inhaler Inhale 2 puffs into the  lungs every 6 (six) hours as needed for wheezing or shortness of breath (when travelling). 18 g 2   Dextromethorphan-guaiFENesin 10-100 MG/5ML liquid Take 5 mLs by mouth every 4 (four) hours as needed for cough. 118 mL 0   mometasone-formoterol (DULERA) 100-5 MCG/ACT AERO Inhale 2 puffs into the lungs in the morning and at bedtime. 13 g 2   predniSONE (DELTASONE) 10 MG tablet Take 1 tablet (10 mg total) by mouth daily with breakfast. 6,5,4,3,2,1 take each days dose with food all at once 21 tablet 0   No facility-administered medications prior to visit.    Review of Systems  Constitutional:   Negative for chills, fever, malaise/fatigue and weight loss.  HENT:  Negative for congestion, sinus pain and sore throat.   Eyes: Negative.   Respiratory:  Positive for cough. Negative for hemoptysis, sputum production, shortness of breath and wheezing.   Cardiovascular:  Negative for chest pain, palpitations, orthopnea, claudication and leg swelling.  Gastrointestinal:  Negative for abdominal pain, heartburn, nausea and vomiting.  Genitourinary: Negative.   Musculoskeletal:  Negative for joint pain and myalgias.  Skin:  Negative for rash.  Neurological:  Negative for weakness.  Endo/Heme/Allergies: Negative.   Psychiatric/Behavioral: Negative.     Objective:   Vitals:   03/23/21 0859  BP: 118/74  Pulse: 90  Temp: (!) 97.3 F (36.3 C)  TempSrc: Oral  SpO2: 98%  Weight: 145 lb 3.2 oz (65.9 kg)  Height: _0  (1.854 m)   Physical Exam Constitutional:      General: He is not in acute distress. HENT:     Head: Normocephalic and atraumatic.  Eyes:     Extraocular Movements: Extraocular movements intact.     Conjunctiva/sclera: Conjunctivae normal.     Pupils: Pupils are equal, round, and reactive to light.  Cardiovascular:     Rate and Rhythm: Normal rate and regular rhythm.     Pulses: Normal pulses.     Heart sounds: Normal heart sounds. No murmur heard. Pulmonary:     Effort: Pulmonary effort is normal.     Breath sounds: Decreased air movement present.  Abdominal:     General: Bowel sounds are normal.     Palpations: Abdomen is soft.  Musculoskeletal:     Right lower leg: No edema.     Left lower leg: No edema.  Lymphadenopathy:     Cervical: No cervical adenopathy.  Skin:    General: Skin is warm and dry.  Neurological:     General: No focal deficit present.     Mental Status: He is alert.  Psychiatric:        Mood and Affect: Mood normal.        Behavior: Behavior normal.        Thought Content: Thought content normal.        Judgment: Judgment normal.    CBC    Component Value Date/Time   WBC 5.0 03/16/2021 1531   WBC 7.9 02/18/2021 0101   RBC 5.21 03/16/2021 1531   RBC 4.68 02/18/2021 0101   HGB 14.6 03/16/2021 1531   HCT 44.2 03/16/2021 1531   PLT 307 03/16/2021 1531   MCV 85 03/16/2021 1531   MCH 28.0 03/16/2021 1531   MCH 28.6 02/18/2021 0101   MCHC 33.0 03/16/2021 1531   MCHC 32.2 02/18/2021 0101   RDW 13.1 03/16/2021 1531   LYMPHSABS 2.2 03/16/2021 1531   MONOABS 0.0 (L) 02/11/2021 1530   EOSABS 0.2 03/16/2021 1531   BASOSABS 0.0  03/16/2021 1531   BMP Latest Ref Rng & Units 03/16/2021 02/18/2021 02/14/2021  Glucose 65 - 99 mg/dL 143(H) 102(H) 98  BUN 6 - 24 mg/dL _0 Creatinine 0.76 - 1.27 mg/dL 1.19 1.16 0.97  BUN/Creat Ratio 9 - 20 10 - -  Sodium 134 - 144 mmol/L 146(H) 137 138  Potassium 3.5 - 5.2 mmol/L 5.0 4.0 3.8  Chloride 96 - 106 mmol/L 102 99 99  CO2 20 - 29 mmol/L 27 33(H) 32  Calcium 8.7 - 10.2 mg/dL 9.8 8.7(L) 9.2   Chest imaging: CT chest 02/11/2021  diffuse bronchial wall thickening and bilateral lower lobe predominant cylindrical bronchiectasis with mucoid impaction of the right lower airways.  He has multiple scattered pulmonary nodules measuring 5 to 9 mm in size.  PFT: No flowsheet data found.  Echo 02/16/21: LV EF 60-65%. RV normal function and size.   Labs: Valma Cava 02/14/21 negative  Assessment & Plan:   Bronchiectasis without complication ([place]) - Plan: Pulmonary Function Test, pantoprazole (PROTONIX) 40 MG tablet  Gastroesophageal reflux disease without esophagitis  Discussion: Neville Walston is a 50 year old male, never smoker with bronchiectasis who returns to pulmonary clinic for Fleming follow up for bronchiectasis exacerbation.   He has significantly improved since his hospitalization after completing a 14-day course of antibiotic therapy.  He was started on ICS/LABA therapy with Dulera 100-5 MCG by his primary care provider.  I have been instructed him to use this  inhaler 2 puffs twice daily and to use albuterol inhaler as needed.  I have also recommended that he use albuterol nebulizer treatments in the morning and evening time followed by flutter valve therapy for bronchial hygiene.  He does complain of GERD symptoms and would like to start antacid therapy so I provided him with a prescription for pantoprazole 40 mg daily.  His BAL cultures are negative to date for AFB or fungal organisms.  He is to follow-up in 3 months with pulmonary function tests and repeat CT chest scan to follow pulmonary nodules.  We will consider further work-up of his bronchiectasis at that time once he is done with his current prednisone therapy.  Freda Jackson, MD [facility] Office: [phone]    Current Outpatient Medications:    albuterol (PROVENTIL) (2.5 MG/3ML) 0.083% nebulizer solution, Inhale 3 mLs into the lungs every 4 (four) hours as needed for wheezing or shortness of breath., Disp: 90 mL, Rfl: 12   albuterol (VENTOLIN HFA) 108 (90 Base) MCG/ACT inhaler, Inhale 2 puffs into the lungs every 6 (six) hours as needed for wheezing or shortness of breath (when travelling)., Disp: 18 g, Rfl: 2   Dextromethorphan-guaiFENesin 10-100 MG/5ML liquid, Take 5 mLs by mouth every 4 (four) hours as needed for cough., Disp: 118 mL, Rfl: 0   mometasone-formoterol (DULERA) 100-5 MCG/ACT AERO, Inhale 2 puffs into the lungs in the morning and at bedtime., Disp: 13 g, Rfl: 2   pantoprazole (PROTONIX) 40 MG tablet, Take 1 tablet (40 mg total) by mouth daily., Disp: 30 tablet, Rfl: 6   predniSONE (DELTASONE) 10 MG tablet, Take 1 tablet (10 mg total) by mouth daily with breakfast. 6,5,4,3,2,1 take each days dose with food all at once, Disp: 21 tablet, Rfl: 0

## 2021-03-23 NOTE — Patient Instructions (Addendum)
Use dulera inhaler (blue inhaler) 2 puffs twice daily - rinse mouth out after each use  Use albuterol nebulizer treatment in the morning and evening time followed by flutter valve use  Use albuterol inhaler 1-2 puffs every 4-6 hours as needed for cough, wheezing, shortness of breath or chest tightness.   We will schedule you for pulmonary function tests at your follow up visit in December  We will check a CT scan in early December and review the results at your follow up visit.

## 2021-04-01 LAB — ACID FAST CULTURE WITH REFLEXED SENSITIVITIES (MYCOBACTERIA): Acid Fast Culture: NEGATIVE

## 2021-04-06 LAB — ACID FAST CULTURE WITH REFLEXED SENSITIVITIES (MYCOBACTERIA): Acid Fast Culture: NEGATIVE

## 2021-04-09 LAB — ACID FAST CULTURE WITH REFLEXED SENSITIVITIES (MYCOBACTERIA): Acid Fast Culture: NEGATIVE

## 2021-04-17 LAB — ACID FAST CULTURE WITH REFLEXED SENSITIVITIES (MYCOBACTERIA): Acid Fast Culture: NEGATIVE

## 2021-05-16 ENCOUNTER — Encounter: Payer: Self-pay | Admitting: Nurse Practitioner

## 2021-05-16 ENCOUNTER — Other Ambulatory Visit: Payer: Self-pay

## 2021-05-16 ENCOUNTER — Ambulatory Visit: Payer: Self-pay | Attending: Nurse Practitioner | Admitting: Nurse Practitioner

## 2021-05-16 VITALS — BP 102/69 | HR 102 | Ht 73.0 in | Wt 153.2 lb

## 2021-05-16 DIAGNOSIS — Z1211 Encounter for screening for malignant neoplasm of colon: Secondary | ICD-10-CM

## 2021-05-16 DIAGNOSIS — R7309 Other abnormal glucose: Secondary | ICD-10-CM

## 2021-05-16 DIAGNOSIS — Z23 Encounter for immunization: Secondary | ICD-10-CM

## 2021-05-16 DIAGNOSIS — E785 Hyperlipidemia, unspecified: Secondary | ICD-10-CM

## 2021-05-16 DIAGNOSIS — J479 Bronchiectasis, uncomplicated: Secondary | ICD-10-CM

## 2021-05-16 MED ORDER — PANTOPRAZOLE SODIUM 40 MG PO TBEC
40.0000 mg | DELAYED_RELEASE_TABLET | Freq: Every day | ORAL | 6 refills | Status: DC
  Filled 2021-05-16: qty 30, 30d supply, fill #0
  Filled 2021-06-16: qty 30, 30d supply, fill #1
  Filled 2021-07-18: qty 30, 30d supply, fill #0

## 2021-05-16 MED ORDER — MOMETASONE FURO-FORMOTEROL FUM 100-5 MCG/ACT IN AERO
2.0000 | INHALATION_SPRAY | Freq: Two times a day (BID) | RESPIRATORY_TRACT | 2 refills | Status: DC
  Filled 2021-05-16: qty 13, 30d supply, fill #0
  Filled 2021-06-16: qty 13, 30d supply, fill #1

## 2021-05-16 MED ORDER — ALBUTEROL SULFATE HFA 108 (90 BASE) MCG/ACT IN AERS
2.0000 | INHALATION_SPRAY | Freq: Four times a day (QID) | RESPIRATORY_TRACT | 2 refills | Status: DC | PRN
  Filled 2021-05-16: qty 18, 16d supply, fill #0
  Filled 2021-06-16: qty 18, 16d supply, fill #1
  Filled 2021-07-18: qty 18, 16d supply, fill #0

## 2021-05-16 NOTE — Progress Notes (Signed)
Assessment & Plan:  Shane Fleming was seen today for medication refill.  Diagnoses and all orders for this visit:  Bronchiectasis without complication (HCC) -     mometasone-formoterol (DULERA) 100-5 MCG/ACT AERO; Inhale 2 puffs into the lungs in the morning and at bedtime. -     pantoprazole (PROTONIX) 40 MG tablet; Take 1 tablet (40 mg total) by mouth daily. FOR ACID REFLUX -     albuterol (VENTOLIN HFA) 108 (90 Base) MCG/ACT inhaler; Inhale 2 puffs into the lungs every 6 (six) hours as needed for wheezing or shortness of breath (when travelling).  Colon cancer screening -     Fecal occult blood, imunochemical([facility])  Elevated glucose -     Hemoglobin A1c  Dyslipidemia, goal LDL below 100 -     Lipid panel    Patient has been counseled on age-appropriate routine health concerns for screening and prevention. These are reviewed and up-to-date. Referrals have been placed accordingly. Immunizations are up-to-date or declined.    Subjective:   Chief Complaint  Patient presents with   Medication Refill    Shane Fleming 50 y.o. male presents to office today for medication refills.   Currently out of refills for his dulera and albuterol. Being followed by Pulmonology for bronchiectasis  with exacerbation. Completed 14 days of fluoroquinolone therapy for sputum positive Klebs PNA in September.  Currently does not use any oxygen. Denies any worsening shortness of breath, wheezing or cough.   Review of Systems  Constitutional:  Negative for fever, malaise/fatigue and weight loss.  HENT: Negative.  Negative for nosebleeds.   Eyes: Negative.  Negative for blurred vision, double vision and photophobia.  Respiratory: Negative.  Negative for cough and shortness of breath.   Cardiovascular: Negative.  Negative for chest pain, palpitations and leg swelling.  Gastrointestinal: Negative.  Negative for heartburn, nausea and vomiting.  Musculoskeletal: Negative.  Negative for myalgias.   Neurological: Negative.  Negative for dizziness, focal weakness, seizures and headaches.  Psychiatric/Behavioral: Negative.  Negative for suicidal ideas.    Past Medical History:  Diagnosis Date   Sinusitis 2018    Past Surgical History:  Procedure Laterality Date   APPENDECTOMY     BRONCHIAL WASHINGS  02/17/2021   Procedure: BRONCHIAL WASHINGS;  Surgeon: Martina Sinner, MD;  [facility]: [facility] ENDOSCOPY;  Service: Pulmonary;;   LAPAROSCOPIC APPENDECTOMY N/A 04/08/2015   Procedure: APPENDECTOMY LAPAROSCOPIC;  Surgeon: Glenna Fellows, MD;  Location: [facility] ORS;  Service: General;  Laterality: N/A;   VIDEO BRONCHOSCOPY N/A 02/17/2021   Procedure: VIDEO BRONCHOSCOPY WITHOUT FLUORO;  Surgeon: Martina Sinner, MD;  Location: [facility] ENDOSCOPY;  Service: Pulmonary;  Laterality: N/A;    Family History  Problem Relation Age of Onset   Asthma Father     Social History Reviewed with no changes to be made today.   Outpatient Medications Prior to Visit  Medication Sig Dispense Refill   albuterol (PROVENTIL) (2.5 MG/3ML) 0.083% nebulizer solution Inhale 3 mLs into the lungs every 4 (four) hours as needed for wheezing or shortness of breath. 90 mL 12   Dextromethorphan-guaiFENesin 10-100 MG/5ML liquid Take 5 mLs by mouth every 4 (four) hours as needed for cough. 118 mL 0   albuterol (VENTOLIN HFA) 108 (90 Base) MCG/ACT inhaler Inhale 2 puffs into the lungs every 6 (six) hours as needed for wheezing or shortness of breath (when travelling). 18 g 2   mometasone-formoterol (DULERA) 100-5 MCG/ACT AERO Inhale 2 puffs into the lungs in the morning and at bedtime. 13 g  2   pantoprazole (PROTONIX) 40 MG tablet Take 1 tablet (40 mg total) by mouth daily. (Patient not taking: Reported on 05/16/2021) 30 tablet 6   predniSONE (DELTASONE) 10 MG tablet Take 1 tablet (10 mg total) by mouth daily with breakfast. 6,5,4,3,2,1 take each days dose with food all at once (Patient not taking: Reported on 05/16/2021) 21 tablet  0   No facility-administered medications prior to visit.    No Known Allergies     Objective:    BP 102/69   Pulse (!) 102   Ht 6\' 1"  (1.854 m)   Wt 153 lb 4 oz (69.5 kg)   SpO2 95%   BMI 20.22 kg/m  Wt Readings from Last 3 Encounters:  05/16/21 153 lb 4 oz (69.5 kg)  03/23/21 145 lb 3.2 oz (65.9 kg)  03/16/21 144 lb 2 oz (65.4 kg)    Physical Exam Vitals and nursing note reviewed.  Constitutional:      Appearance: He is well-developed.  HENT:     Head: Normocephalic and atraumatic.  Cardiovascular:     Rate and Rhythm: Normal rate and regular rhythm.     Heart sounds: Normal heart sounds. No murmur heard.   No friction rub. No gallop.  Pulmonary:     Effort: Pulmonary effort is normal. No tachypnea or respiratory distress.     Breath sounds: Examination of the right-upper field reveals wheezing. Examination of the left-upper field reveals wheezing. Examination of the right-middle field reveals wheezing. Examination of the left-middle field reveals wheezing. Examination of the right-lower field reveals wheezing. Examination of the left-lower field reveals wheezing. Wheezing present. No decreased breath sounds, rhonchi or rales.  Chest:     Chest wall: No tenderness.  Abdominal:     General: Bowel sounds are normal.     Palpations: Abdomen is soft.  Musculoskeletal:        General: Normal range of motion.     Cervical back: Normal range of motion.  Skin:    General: Skin is warm and dry.  Neurological:     Mental Status: He is alert and oriented to person, place, and time.     Coordination: Coordination normal.  Psychiatric:        Behavior: Behavior normal. Behavior is cooperative.        Thought Content: Thought content normal.        Judgment: Judgment normal.         Patient has been counseled extensively about nutrition and exercise as well as the importance of adherence with medications and regular follow-up. The patient was given clear instructions to  go to ER or return to medical center if symptoms don't improve, worsen or new problems develop. The patient verbalized understanding.   Follow-up: Return in about 3 months (around 08/16/2021).   08/18/2021, FNP-BC [facility] [place], [place]   05/16/2021, 2:45 PM

## 2021-05-17 LAB — LIPID PANEL
Chol/HDL Ratio: 3.4 ratio (ref 0.0–5.0)
Cholesterol, Total: 175 mg/dL (ref 100–199)
HDL: 52 mg/dL (ref >39)
LDL Chol Calc (NIH): 112 mg/dL — ABNORMAL HIGH (ref 0–99)
Triglycerides: 58 mg/dL (ref 0–149)
VLDL Cholesterol Cal: 11 mg/dL (ref 5–40)

## 2021-05-17 LAB — HEMOGLOBIN A1C
Est. average glucose Bld gHb Est-mCnc: 134 mg/dL
Hgb A1c MFr Bld: 6.3 % — ABNORMAL HIGH (ref 4.8–5.6)

## 2021-05-19 LAB — FECAL OCCULT BLOOD, IMMUNOCHEMICAL: Fecal Occult Bld: NEGATIVE

## 2021-05-20 ENCOUNTER — Telehealth: Payer: Self-pay

## 2021-05-20 NOTE — Telephone Encounter (Signed)
Left message to return call to our office.  

## 2021-05-20 NOTE — Telephone Encounter (Signed)
-----  Message from Gildardo Pounds, NP sent at 05/20/2021  8:29 AM EST ----- Stool kit negative for blood.  A1c shows prediabetes.  Try to maintain a diet that is high in fruits and vegetables and lean protein with less carbohydrates

## 2021-05-23 ENCOUNTER — Ambulatory Visit: Payer: Self-pay | Admitting: *Deleted

## 2021-05-23 NOTE — Telephone Encounter (Signed)
Pt returned call.  No one at the office could answer.

## 2021-05-23 NOTE — Telephone Encounter (Signed)
Attempted to call pt back for lab results and someone answers but does not say anything. Called again and LM to call back. Provided phone number.

## 2021-05-23 NOTE — Telephone Encounter (Signed)
Pt given lab results per notes of Z. Meredeth Ide , NP from 05/18/21 and 05/20/21 on 05/23/21. Pt verbalized understanding.

## 2021-06-15 ENCOUNTER — Ambulatory Visit
Admission: RE | Admit: 2021-06-15 | Discharge: 2021-06-15 | Disposition: A | Discharge status: Home / Self Care | Payer: Self-pay | Source: Ambulatory Visit | Attending: Pulmonary Disease | Admitting: Pulmonary Disease

## 2021-06-15 DIAGNOSIS — J479 Bronchiectasis, uncomplicated: Secondary | ICD-10-CM

## 2021-06-16 ENCOUNTER — Other Ambulatory Visit: Payer: Self-pay

## 2021-06-20 ENCOUNTER — Encounter: Payer: Self-pay | Admitting: Pulmonary Disease

## 2021-06-20 ENCOUNTER — Other Ambulatory Visit: Payer: Self-pay

## 2021-06-20 ENCOUNTER — Ambulatory Visit (INDEPENDENT_AMBULATORY_CARE_PROVIDER_SITE_OTHER): Payer: Self-pay | Admitting: Pulmonary Disease

## 2021-06-20 VITALS — BP 126/76 | HR 98 | Ht 73.0 in | Wt 150.0 lb

## 2021-06-20 DIAGNOSIS — J479 Bronchiectasis, uncomplicated: Secondary | ICD-10-CM

## 2021-06-20 DIAGNOSIS — K219 Gastro-esophageal reflux disease without esophagitis: Secondary | ICD-10-CM

## 2021-06-20 MED ORDER — STIOLTO RESPIMAT 2.5-2.5 MCG/ACT IN AERS: 2.0000 | INHALATION_SPRAY | Freq: Every day | RESPIRATORY_TRACT | 6 refills | Status: DC

## 2021-06-20 MED ORDER — STIOLTO RESPIMAT 2.5-2.5 MCG/ACT IN AERS
2.0000 | INHALATION_SPRAY | Freq: Every day | RESPIRATORY_TRACT | 0 refills | Status: DC
Start: 2021-06-20 — End: 2021-09-20

## 2021-06-20 NOTE — Patient Instructions (Addendum)
Start Stiolto inhaler 2 puffs daily  Stop using the Dulera inhaler while on Stiolto  Use albuterol nebulizer treatment twice daily followed by flutter valve therapy.   Follow up in 3 months for pulmonary function tests

## 2021-06-20 NOTE — Progress Notes (Signed)
Synopsis: Referred in September 202 for bronchiectasis  Subjective:   PATIENT ID: Shane Fleming GENDER: male DOB: [birth year], MRN: [id number]   HPI  Chief Complaint  Patient presents with   Follow-up    3 mo f/u for bronchiectasis. States his cough has been non-productive. Denies any SOB.    Shane Fleming is a 50 year old male, never smoker with bronchiectasis who returns to pulmonary clinic for follow up for bronchiectasis.   He has done well since last visit on Dulera 100-5 MCG 2 puffs twice daily along with initiation of pantoprazole 40 mg daily for GERD.  He has also been using albuterol nebulizer treatments followed by flutter valve therapy. He reports since the weather has changed he has increased cough and shortness of breath.  He reports a slight increase in sputum production since his cough increased.  OV 03/23/21 Sputum culture was positive for Klebsiella pneumonia from expectorated sputum sample.  He completed 14 days of fluoroquinolone therapy and is feeling much better.  His fungal and AFB cultures show no growth to date from a bronchoscopy with BAL on 02/17/2021.  CT chest scan from 02/11/2021 shows diffuse bronchial wall thickening and bilateral lower lobe predominant cylindrical bronchiectasis with mucoid impaction of the right lower airways.  He has multiple scattered pulmonary nodules measuring 5 to 9 mm in size.  He was discharged on home oxygen which she has stopped using over the past 1 week.  He was seen by his primary care provider 03/16/2021, note personally reviewed, and he was provided [facility] inhaler 100-5 MCG 2 puffs twice daily along with as needed albuterol inhaler and nebulizer solution.  He does have a nebulizer machine at home along with flutter valve.  He is finishing a course of prednisone from his primary care.  He denies any issues with seasonal allergies.  He reports the cold air does bother his breathing in the wintertime.  He is a never smoker but  reports secondhand smoke exposure from friends and relatives while growing up.  He is originally from [place].  He denies any significant exposure to charcoal or wood-burning stove's while growing up.  He denies any respiratory issues as a kid or in his younger years.  He reports he is only had a cough over the last 4 years.  Laboratory test reviewed from his hospitalization and they are negative for ANA, rheumatoid factor and ANCA testing.  IgG level is increased at 2000 464 and IgG4 subclass elevated at 106.  IgE level 252.  Past Medical History:  Diagnosis Date   Sinusitis 2018     Family History  Problem Relation Age of Onset   Asthma Father      Social History   Socioeconomic History   Marital status: Single    Spouse name: Not on file   Number of children: Not on file   Years of education: Not on file   Highest education level: Not on file  Occupational History   Not on file  Tobacco Use   Smoking status: Never   Smokeless tobacco: Never  Substance and Sexual Activity   Alcohol use: No   Drug use: No   Sexual activity: Not Currently  Other Topics Concern   Not on file  Social History Narrative   Not on file   Social Determinants of Health   Financial Resource Strain: Not on file  Food Insecurity: Not on file  Transportation Needs: Not on file  Physical Activity: Not on file  Stress:  Not on file  Social Connections: Not on file  Intimate Partner Violence: Not on file     No Known Allergies   Outpatient Medications Prior to Visit  Medication Sig Dispense Refill   albuterol (PROVENTIL) (2.5 MG/3ML) 0.083% nebulizer solution Inhale 3 mLs into the lungs every 4 (four) hours as needed for wheezing or shortness of breath. 90 mL 12   albuterol (VENTOLIN HFA) 108 (90 Base) MCG/ACT inhaler Inhale 2 puffs into the lungs every 6 (six) hours as needed for wheezing or shortness of breath (when travelling). 18 g 2   Dextromethorphan-guaiFENesin 10-100 MG/5ML liquid Take 5  mLs by mouth every 4 (four) hours as needed for cough. 118 mL 0   pantoprazole (PROTONIX) 40 MG tablet Take 1 tablet (40 mg total) by mouth daily. FOR ACID REFLUX 30 tablet 6   mometasone-formoterol (DULERA) 100-5 MCG/ACT AERO Inhale 2 puffs into the lungs in the morning and at bedtime. 13 g 2   No facility-administered medications prior to visit.    Review of Systems  Constitutional:  Negative for chills, fever, malaise/fatigue and weight loss.  HENT:  Negative for congestion, sinus pain and sore throat.   Eyes: Negative.   Respiratory:  Positive for cough. Negative for hemoptysis, sputum production, shortness of breath and wheezing.   Cardiovascular:  Negative for chest pain, palpitations, orthopnea, claudication and leg swelling.  Gastrointestinal:  Negative for abdominal pain, heartburn, nausea and vomiting.  Genitourinary: Negative.   Musculoskeletal:  Negative for joint pain and myalgias.  Skin:  Negative for rash.  Neurological:  Negative for weakness.  Endo/Heme/Allergies: Negative.   Psychiatric/Behavioral: Negative.     Objective:   Vitals:   06/20/21 1001  BP: 126/76  Pulse: 98  SpO2: 97%  Weight: 150 lb (68 kg)  Height: _0  (1.854 m)   Physical Exam Constitutional:      General: He is not in acute distress. HENT:     Head: Normocephalic and atraumatic.  Eyes:     Extraocular Movements: Extraocular movements intact.     Conjunctiva/sclera: Conjunctivae normal.     Pupils: Pupils are equal, round, and reactive to light.  Cardiovascular:     Rate and Rhythm: Normal rate and regular rhythm.     Pulses: Normal pulses.     Heart sounds: Normal heart sounds. No murmur heard. Pulmonary:     Effort: Pulmonary effort is normal.     Breath sounds: Decreased air movement present.  Abdominal:     General: Bowel sounds are normal.     Palpations: Abdomen is soft.  Musculoskeletal:     Right lower leg: No edema.     Left lower leg: No edema.  Lymphadenopathy:      Cervical: No cervical adenopathy.  Skin:    General: Skin is warm and dry.  Neurological:     General: No focal deficit present.     Mental Status: He is alert.  Psychiatric:        Mood and Affect: Mood normal.        Behavior: Behavior normal.        Thought Content: Thought content normal.        Judgment: Judgment normal.   CBC    Component Value Date/Time   WBC 5.0 03/16/2021 1531   WBC 7.9 02/18/2021 0101   RBC 5.21 03/16/2021 1531   RBC 4.68 02/18/2021 0101   HGB 14.6 03/16/2021 1531   HCT 44.2 03/16/2021 1531   PLT 307 03/16/2021 1531  MCV 85 03/16/2021 1531   MCH 28.0 03/16/2021 1531   MCH 28.6 02/18/2021 0101   MCHC 33.0 03/16/2021 1531   MCHC 32.2 02/18/2021 0101   RDW 13.1 03/16/2021 1531   LYMPHSABS 2.2 03/16/2021 1531   MONOABS 0.0 (L) 02/11/2021 1530   EOSABS 0.2 03/16/2021 1531   BASOSABS 0.0 03/16/2021 1531   BMP Latest Ref Rng & Units 03/16/2021 02/18/2021 02/14/2021  Glucose 65 - 99 mg/dL 143(H) 102(H) 98  BUN 6 - 24 mg/dL _0 Creatinine 0.76 - 1.27 mg/dL 1.19 1.16 0.97  BUN/Creat Ratio 9 - 20 10 - -  Sodium 134 - 144 mmol/L 146(H) 137 138  Potassium 3.5 - 5.2 mmol/L 5.0 4.0 3.8  Chloride 96 - 106 mmol/L 102 99 99  CO2 20 - 29 mmol/L 27 33(H) 32  Calcium 8.7 - 10.2 mg/dL 9.8 8.7(L) 9.2   Chest imaging: CT Chest 06/15/21 1. Diffuse bilateral centrilobular nodules, some with tree-in-bud configuration are again seen with mild lower lung predominant cylindrical bronchiectasis, overall improved, however, there are some new areas of nodularity. Findings are likely due to chronic atypical infection, diffuse panbronchiolitis is an additional consideration given history of sinusitis. 2. Interval resolution of bilateral lower lobe mucoid impaction. 3. Largest new solid nodule measures 7 mm in mean diameter. Non-contrast chest CT at 6-12 months is recommended. If the nodule is stable at time of repeat CT, then future CT at 18-24 months (from today's  scan) is considered optional for low-risk patients, but is recommended for high-risk patients. This recommendation follows the consensus statement: Guidelines for Management of Incidental Pulmonary Nodules Detected on CT Images: From the Fleischner Society 2017; Radiology 2017; 284:228-243. 4. Mildly patulous esophagus, finding can be seen in the setting of esophageal dysmotility.  CT chest 02/11/2021  diffuse bronchial wall thickening and bilateral lower lobe predominant cylindrical bronchiectasis with mucoid impaction of the right lower airways.  He has multiple scattered pulmonary nodules measuring 5 to 9 mm in size.  PFT: No flowsheet data found.  Echo 02/16/21: LV EF 60-65%. RV normal function and size.   Labs: Valma Cava 02/14/21 negative  Assessment & Plan:   Bronchiectasis without complication ([place]) - Plan: Tiotropium Bromide-Olodaterol (STIOLTO RESPIMAT) 2.5-2.5 MCG/ACT AERS  Gastroesophageal reflux disease without esophagitis  Discussion: Jaheem Hedgepath is a 50 year old male, never smoker with bronchiectasis who returns to pulmonary clinic for follow up for bronchiectasis.   He may have a degree of reactive airways disease along with his bronchiectasis as he has increased cough and chest tightness with cold air.  Given his bronchiectasis we will transition him off inhaled corticosteroid inhaler due to risk of pneumonia and start him on Stiolto inhaler 2 puffs daily.  He is to stop the [facility] inhaler.  He can continue albuterol nebulizer treatments as needed followed by flutter valve therapy for airway clearance. CT Chest scan 12/14 shows improved airway clearance since CT scan in 01/2021.   Patient has abnormal immunoglobulin levels from 02/11/2021 with elevated IgG total and elevated IgG subclass 4 and an IgE level of 252.  If he has frequent exacerbations we will consider further treatment with biologic treatment.  He is to continue pantoprazole 40 mg daily for  GERD.  He is to follow-up in 3 months with pulmonary function tests and repeat CT chest scan to follow pulmonary nodules.  Freda Jackson, MD [facility] Office: [phone]    Current Outpatient Medications:    albuterol (PROVENTIL) (2.5 MG/3ML)  0.083% nebulizer solution, Inhale 3 mLs into the lungs every 4 (four) hours as needed for wheezing or shortness of breath., Disp: 90 mL, Rfl: 12   albuterol (VENTOLIN HFA) 108 (90 Base) MCG/ACT inhaler, Inhale 2 puffs into the lungs every 6 (six) hours as needed for wheezing or shortness of breath (when travelling)., Disp: 18 g, Rfl: 2   Dextromethorphan-guaiFENesin 10-100 MG/5ML liquid, Take 5 mLs by mouth every 4 (four) hours as needed for cough., Disp: 118 mL, Rfl: 0   pantoprazole (PROTONIX) 40 MG tablet, Take 1 tablet (40 mg total) by mouth daily. FOR ACID REFLUX, Disp: 30 tablet, Rfl: 6   Tiotropium Bromide-Olodaterol (STIOLTO RESPIMAT) 2.5-2.5 MCG/ACT AERS, Inhale 2 puffs into the lungs daily., Disp: 4 g, Rfl: 6   Tiotropium Bromide-Olodaterol (STIOLTO RESPIMAT) 2.5-2.5 MCG/ACT AERS, Inhale 2 puffs into the lungs daily., Disp: 8 g, Rfl: 0

## 2021-06-21 ENCOUNTER — Encounter: Payer: Self-pay | Admitting: Pulmonary Disease

## 2021-07-18 ENCOUNTER — Other Ambulatory Visit: Payer: Self-pay

## 2021-08-15 ENCOUNTER — Other Ambulatory Visit: Payer: Self-pay

## 2021-08-17 ENCOUNTER — Other Ambulatory Visit: Payer: Self-pay

## 2021-08-17 ENCOUNTER — Ambulatory Visit: Payer: Self-pay | Attending: Nurse Practitioner | Admitting: Nurse Practitioner

## 2021-08-17 ENCOUNTER — Other Ambulatory Visit: Payer: Self-pay | Admitting: Nurse Practitioner

## 2021-08-17 ENCOUNTER — Encounter: Payer: Self-pay | Admitting: Nurse Practitioner

## 2021-08-17 VITALS — BP 125/85 | HR 105 | Ht 73.0 in | Wt 151.4 lb

## 2021-08-17 DIAGNOSIS — R7303 Prediabetes: Secondary | ICD-10-CM

## 2021-08-17 DIAGNOSIS — J471 Bronchiectasis with (acute) exacerbation: Secondary | ICD-10-CM

## 2021-08-17 DIAGNOSIS — J9601 Acute respiratory failure with hypoxia: Secondary | ICD-10-CM

## 2021-08-17 DIAGNOSIS — J479 Bronchiectasis, uncomplicated: Secondary | ICD-10-CM

## 2021-08-17 DIAGNOSIS — J9602 Acute respiratory failure with hypercapnia: Secondary | ICD-10-CM

## 2021-08-17 LAB — POCT GLYCOSYLATED HEMOGLOBIN (HGB A1C): Hemoglobin A1C: 6.3 % — AB (ref 4.0–5.6)

## 2021-08-17 LAB — GLUCOSE, POCT (MANUAL RESULT ENTRY): POC Glucose: 127 mg/dl — AB (ref 70–99)

## 2021-08-17 MED ORDER — PANTOPRAZOLE SODIUM 40 MG PO TBEC
40.0000 mg | DELAYED_RELEASE_TABLET | Freq: Every day | ORAL | 1 refills | Status: DC
  Filled 2021-08-17: qty 30, 30d supply, fill #0
  Filled 2021-09-15: qty 30, 30d supply, fill #1
  Filled 2021-10-17: qty 30, 30d supply, fill #2

## 2021-08-17 MED ORDER — ALBUTEROL SULFATE (2.5 MG/3ML) 0.083% IN NEBU
3.0000 mL | INHALATION_SOLUTION | RESPIRATORY_TRACT | 12 refills | Status: DC | PRN
  Filled 2021-08-17: qty 90, 5d supply, fill #0
  Filled 2021-09-01: qty 90, 5d supply, fill #1

## 2021-08-17 MED ORDER — STIOLTO RESPIMAT 2.5-2.5 MCG/ACT IN AERS
2.0000 | INHALATION_SPRAY | Freq: Every day | RESPIRATORY_TRACT | 1 refills | Status: DC
  Filled 2021-08-17: qty 4, 30d supply, fill #0
  Filled 2021-09-09: qty 4, 30d supply, fill #1

## 2021-08-17 MED ORDER — PREDNISONE 20 MG PO TABS
40.0000 mg | ORAL_TABLET | Freq: Every day | ORAL | 0 refills | Status: AC
  Filled 2021-08-17: qty 6, 3d supply, fill #0

## 2021-08-17 NOTE — Progress Notes (Unsigned)
° °  Assessment & Plan:  There are no diagnoses linked to this encounter.  Patient has been counseled on age-appropriate routine health concerns for screening and prevention. These are reviewed and up-to-date. Referrals have been placed accordingly. Immunizations are up-to-date or declined.    Subjective:  No chief complaint on file.  HPI Shane Fleming 51 y.o. male presents to office today   ROS  Past Medical History:  Diagnosis Date   Sinusitis 2018    Past Surgical History:  Procedure Laterality Date   APPENDECTOMY     BRONCHIAL WASHINGS  02/17/2021   Procedure: BRONCHIAL WASHINGS;  Surgeon: Martina Sinner, MD;  Location: [facility] ENDOSCOPY;  Service: Pulmonary;;   LAPAROSCOPIC APPENDECTOMY N/A 04/08/2015   Procedure: APPENDECTOMY LAPAROSCOPIC;  Surgeon: Glenna Fellows, MD;  Location: [facility] ORS;  Service: General;  Laterality: N/A;   VIDEO BRONCHOSCOPY N/A 02/17/2021   Procedure: VIDEO BRONCHOSCOPY WITHOUT FLUORO;  Surgeon: Martina Sinner, MD;  Location: [facility] ENDOSCOPY;  Service: Pulmonary;  Laterality: N/A;    Family History  Problem Relation Age of Onset   Asthma Father     Social History Reviewed with no changes to be made today.   Outpatient Medications Prior to Visit  Medication Sig Dispense Refill   albuterol (PROVENTIL) (2.5 MG/3ML) 0.083% nebulizer solution Inhale 3 mLs into the lungs every 4 (four) hours as needed for wheezing or shortness of breath. 90 mL 12   albuterol (VENTOLIN HFA) 108 (90 Base) MCG/ACT inhaler Inhale 2 puffs into the lungs every 6 (six) hours as needed for wheezing or shortness of breath (when travelling). 18 g 2   Dextromethorphan-guaiFENesin 10-100 MG/5ML liquid Take 5 mLs by mouth every 4 (four) hours as needed for cough. (Patient not taking: Reported on 08/17/2021) 118 mL 0   pantoprazole (PROTONIX) 40 MG tablet Take 1 tablet (40 mg total) by mouth daily. FOR ACID REFLUX 30 tablet 6   Tiotropium Bromide-Olodaterol (STIOLTO RESPIMAT) 2.5-2.5  MCG/ACT AERS Inhale 2 puffs into the lungs daily. 4 g 6   Tiotropium Bromide-Olodaterol (STIOLTO RESPIMAT) 2.5-2.5 MCG/ACT AERS Inhale 2 puffs into the lungs daily. (Patient not taking: Reported on 08/17/2021) 8 g 0   No facility-administered medications prior to visit.    No Known Allergies     Objective:    There were no vitals taken for this visit. Wt Readings from Last 3 Encounters:  08/17/21 151 lb 6 oz (68.7 kg)  06/20/21 150 lb (68 kg)  05/16/21 153 lb 4 oz (69.5 kg)    Physical Exam       Patient has been counseled extensively about nutrition and exercise as well as the importance of adherence with medications and regular follow-up. The patient was given clear instructions to go to ER or return to medical center if symptoms don't improve, worsen or new problems develop. The patient verbalized understanding.   Follow-up: No follow-ups on file.   Claiborne Rigg, FNP-BC [facility] and [facility] [place], [place] [phone]   08/17/2021, 11:18 AM

## 2021-08-17 NOTE — Progress Notes (Signed)
Assessment & Plan:  [name] was seen today for prediabetes.  Diagnoses and all orders for this visit:  Prediabetes -     POCT glycosylated hemoglobin (Hb A1C) -     POCT glucose (manual entry)  Bronchiectasis with acute exacerbation (HCC) Prednisone 40 mg daily x 3 days Start STIOLTO as soon as possible. Continue nebs as previously prescribed by Pulmonology  Follow up with Pulmonology in March  Patient has been counseled on age-appropriate routine health concerns for screening and prevention. These are reviewed and up-to-date. Referrals have been placed accordingly. Immunizations are up-to-date or declined.    Subjective:   Chief Complaint  Patient presents with   Prediabetes   HPI Shane Fleming 51 y.o. male presents to office today for follow up to prediabetes.  Followed by Dr. Francine Graven Pulmonologist for reactive airway disease and bronchiectasis. Elwin Sleight was dc'd and he was started on Stiolto 2 puffs daily and prn abuterol inhaler/nebs. Unfortunately he did not understand the instructions given verbally and on his AVS and he stopped taking Dulera in December and did not pick up his new inhaler Stiolto. I have instructed him to start this inhaler as soon as possible and I will send to the community clinic pharmacy.  He has diminished breath sounds in the upper lobes and wheezing in the lower lobes today.   Takes pantoprazole daily to help reduce GERD symptoms.    Prediabetes Well controlled.  Lab Results  Component Value Date   HGBA1C 6.3 (A) 08/17/2021    Lab Results  Component Value Date   HGBA1C 6.3 (H) 05/16/2021     Review of Systems  Constitutional:  Negative for fever, malaise/fatigue and weight loss.  HENT: Negative.  Negative for nosebleeds.   Eyes: Negative.  Negative for blurred vision, double vision and photophobia.  Respiratory:  Positive for cough, sputum production, shortness of breath and wheezing.   Cardiovascular: Negative.  Negative for chest pain,  palpitations and leg swelling.  Gastrointestinal: Negative.  Negative for heartburn, nausea and vomiting.  Musculoskeletal: Negative.  Negative for myalgias.  Neurological: Negative.  Negative for dizziness, focal weakness, seizures and headaches.  Psychiatric/Behavioral: Negative.  Negative for suicidal ideas.    Past Medical History:  Diagnosis Date   Sinusitis 2018    Past Surgical History:  Procedure Laterality Date   APPENDECTOMY     BRONCHIAL WASHINGS  02/17/2021   Procedure: BRONCHIAL WASHINGS;  Surgeon: Martina Sinner, MD;  Location: [facility] ENDOSCOPY;  Service: Pulmonary;;   LAPAROSCOPIC APPENDECTOMY N/A 04/08/2015   Procedure: APPENDECTOMY LAPAROSCOPIC;  Surgeon: Glenna Fellows, MD;  Location: [facility] ORS;  Service: General;  Laterality: N/A;   VIDEO BRONCHOSCOPY N/A 02/17/2021   Procedure: VIDEO BRONCHOSCOPY WITHOUT FLUORO;  Surgeon: Martina Sinner, MD;  Location: [facility] ENDOSCOPY;  Service: Pulmonary;  Laterality: N/A;    Family History  Problem Relation Age of Onset   Asthma Father     Social History Reviewed with no changes to be made today.   Outpatient Medications Prior to Visit  Medication Sig Dispense Refill   albuterol (VENTOLIN HFA) 108 (90 Base) MCG/ACT inhaler Inhale 2 puffs into the lungs every 6 (six) hours as needed for wheezing or shortness of breath (when travelling). 18 g 2   albuterol (PROVENTIL) (2.5 MG/3ML) 0.083% nebulizer solution Inhale 3 mLs into the lungs every 4 (four) hours as needed for wheezing or shortness of breath. 90 mL 12   pantoprazole (PROTONIX) 40 MG tablet Take 1 tablet (40 mg total) by mouth  daily. FOR ACID REFLUX 30 tablet 6   Tiotropium Bromide-Olodaterol (STIOLTO RESPIMAT) 2.5-2.5 MCG/ACT AERS Inhale 2 puffs into the lungs daily. 4 g 6   Dextromethorphan-guaiFENesin 10-100 MG/5ML liquid Take 5 mLs by mouth every 4 (four) hours as needed for cough. (Patient not taking: Reported on 08/17/2021) 118 mL 0   Tiotropium Bromide-Olodaterol  (STIOLTO RESPIMAT) 2.5-2.5 MCG/ACT AERS Inhale 2 puffs into the lungs daily. (Patient not taking: Reported on 08/17/2021) 8 g 0   No facility-administered medications prior to visit.    No Known Allergies     Objective:    BP 125/85    Pulse (!) 105    Ht 6\' 1"  (1.854 m)    Wt 151 lb 6 oz (68.7 kg)    SpO2 95%    BMI 19.97 kg/m  Wt Readings from Last 3 Encounters:  08/17/21 151 lb 6 oz (68.7 kg)  06/20/21 150 lb (68 kg)  05/16/21 153 lb 4 oz (69.5 kg)    Physical Exam Vitals and nursing note reviewed.  Constitutional:      Appearance: He is well-developed.  HENT:     Head: Normocephalic and atraumatic.  Cardiovascular:     Rate and Rhythm: Normal rate and regular rhythm.     Heart sounds: Normal heart sounds. No murmur heard.   No friction rub. No gallop.  Pulmonary:     Effort: Pulmonary effort is normal. No tachypnea or respiratory distress.     Breath sounds: Decreased air movement present. Examination of the right-upper field reveals decreased breath sounds. Examination of the left-upper field reveals decreased breath sounds. Examination of the right-middle field reveals decreased breath sounds. Examination of the left-middle field reveals decreased breath sounds. Examination of the right-lower field reveals wheezing. Examination of the left-lower field reveals wheezing. Decreased breath sounds and wheezing present. No rhonchi or rales.  Chest:     Chest wall: No tenderness.  Abdominal:     General: Bowel sounds are normal.     Palpations: Abdomen is soft.  Musculoskeletal:        General: Normal range of motion.     Cervical back: Normal range of motion.  Skin:    General: Skin is warm and dry.  Neurological:     Mental Status: He is alert and oriented to person, place, and time.     Coordination: Coordination normal.  Psychiatric:        Behavior: Behavior normal. Behavior is cooperative.        Thought Content: Thought content normal.        Judgment: Judgment  normal.         Patient has been counseled extensively about nutrition and exercise as well as the importance of adherence with medications and regular follow-up. The patient was given clear instructions to go to ER or return to medical center if symptoms don't improve, worsen or new problems develop. The patient verbalized understanding.   Follow-up: Return in about 6 months (around 02/14/2022).   02/16/2022, FNP-BC [facility] [place], [place]   08/17/2021, 12:08 PM

## 2021-08-18 ENCOUNTER — Other Ambulatory Visit: Payer: Self-pay

## 2021-08-18 LAB — CMP14+EGFR
ALT: 24 IU/L (ref 0–44)
AST: 28 IU/L (ref 0–40)
Albumin/Globulin Ratio: 1.1 — ABNORMAL LOW (ref 1.2–2.2)
Albumin: 4.3 g/dL (ref 3.8–4.9)
Alkaline Phosphatase: 83 IU/L (ref 44–121)
BUN/Creatinine Ratio: 17 (ref 9–20)
BUN: 21 mg/dL (ref 6–24)
Bilirubin Total: <0.2 mg/dL (ref 0.0–1.2)
CO2: 25 mmol/L (ref 20–29)
Calcium: 9.6 mg/dL (ref 8.7–10.2)
Chloride: 103 mmol/L (ref 96–106)
Creatinine, Ser: 1.25 mg/dL (ref 0.76–1.27)
Globulin, Total: 4 g/dL (ref 1.5–4.5)
Glucose: 112 mg/dL — ABNORMAL HIGH (ref 70–99)
Potassium: 4 mmol/L (ref 3.5–5.2)
Sodium: 145 mmol/L — ABNORMAL HIGH (ref 134–144)
Total Protein: 8.3 g/dL (ref 6.0–8.5)
eGFR: 69 mL/min/{1.73_m2} (ref >59)

## 2021-08-19 ENCOUNTER — Telehealth: Payer: Self-pay

## 2021-08-19 NOTE — Telephone Encounter (Signed)
Called patient reviewed all information and repeated back to me. Will call if any questions.  ? ?

## 2021-08-19 NOTE — Telephone Encounter (Signed)
-----   Message from Claiborne Rigg, NP sent at 08/18/2021  8:44 PM EST ----- Kidney, liver function and electrolytes are essentially normal.

## 2021-09-01 ENCOUNTER — Other Ambulatory Visit: Payer: Self-pay

## 2021-09-09 ENCOUNTER — Other Ambulatory Visit: Payer: Self-pay

## 2021-09-12 ENCOUNTER — Other Ambulatory Visit: Payer: Self-pay

## 2021-09-15 ENCOUNTER — Other Ambulatory Visit: Payer: Self-pay

## 2021-09-19 ENCOUNTER — Other Ambulatory Visit: Payer: Self-pay | Admitting: *Deleted

## 2021-09-19 ENCOUNTER — Other Ambulatory Visit: Payer: Self-pay | Admitting: Internal Medicine

## 2021-09-19 DIAGNOSIS — J479 Bronchiectasis, uncomplicated: Secondary | ICD-10-CM

## 2021-09-19 LAB — SARS CORONAVIRUS 2 (TAT 6-24 HRS): SARS Coronavirus 2: NEGATIVE

## 2021-09-20 ENCOUNTER — Telehealth: Payer: Self-pay | Admitting: Pulmonary Disease

## 2021-09-20 ENCOUNTER — Encounter: Payer: Self-pay | Admitting: Pulmonary Disease

## 2021-09-20 ENCOUNTER — Ambulatory Visit (INDEPENDENT_AMBULATORY_CARE_PROVIDER_SITE_OTHER): Payer: Self-pay | Admitting: Pulmonary Disease

## 2021-09-20 ENCOUNTER — Other Ambulatory Visit: Payer: Self-pay

## 2021-09-20 ENCOUNTER — Other Ambulatory Visit: Payer: Self-pay | Admitting: Nurse Practitioner

## 2021-09-20 VITALS — BP 128/84 | HR 92 | Ht 73.0 in | Wt 145.0 lb

## 2021-09-20 DIAGNOSIS — J479 Bronchiectasis, uncomplicated: Secondary | ICD-10-CM

## 2021-09-20 DIAGNOSIS — J470 Bronchiectasis with acute lower respiratory infection: Secondary | ICD-10-CM

## 2021-09-20 LAB — PULMONARY FUNCTION TEST
FEF 25-75 Post: 0.55 L/sec
FEF 25-75 Pre: 0.47 L/sec
FEF2575-%Change-Post: 18 %
FEF2575-%Pred-Post: 15 %
FEF2575-%Pred-Pre: 13 %
FEV1-%Change-Post: 6 %
FEV1-%Pred-Post: 31 %
FEV1-%Pred-Pre: 29 %
FEV1-Post: 1.17 L
FEV1-Pre: 1.09 L
FEV1FVC-%Change-Post: 4 %
FEV1FVC-%Pred-Pre: 60 %
FEV6-%Change-Post: 4 %
FEV6-%Pred-Post: 51 %
FEV6-%Pred-Pre: 48 %
FEV6-Post: 2.28 L
FEV6-Pre: 2.19 L
FEV6FVC-%Change-Post: 1 %
FEV6FVC-%Pred-Post: 101 %
FEV6FVC-%Pred-Pre: 99 %
FVC-%Change-Post: 2 %
FVC-%Pred-Post: 50 %
FVC-%Pred-Pre: 49 %
FVC-Post: 2.32 L
FVC-Pre: 2.26 L
Post FEV1/FVC ratio: 50 %
Post FEV6/FVC ratio: 99 %
Pre FEV1/FVC ratio: 48 %
Pre FEV6/FVC Ratio: 97 %
RV % pred: 107 %
RV: 2.44 L
TLC % pred: 74 %
TLC: 5.63 L

## 2021-09-20 LAB — CBC WITH DIFFERENTIAL/PLATELET
Basophils Absolute: 0 10*3/uL (ref 0.0–0.1)
Basophils Relative: 0.6 % (ref 0.0–3.0)
Eosinophils Absolute: 0.2 10*3/uL (ref 0.0–0.7)
Eosinophils Relative: 3.2 % (ref 0.0–5.0)
HCT: 43.4 % (ref 39.0–52.0)
Hemoglobin: 14.1 g/dL (ref 13.0–17.0)
Lymphocytes Relative: 22.2 % (ref 12.0–46.0)
Lymphs Abs: 1.5 10*3/uL (ref 0.7–4.0)
MCHC: 32.6 g/dL (ref 30.0–36.0)
MCV: 88.3 fl (ref 78.0–100.0)
Monocytes Absolute: 0.6 10*3/uL (ref 0.1–1.0)
Monocytes Relative: 8.6 % (ref 3.0–12.0)
Neutro Abs: 4.5 10*3/uL (ref 1.4–7.7)
Neutrophils Relative %: 65.4 % (ref 43.0–77.0)
Platelets: 350 10*3/uL (ref 150.0–400.0)
RBC: 4.92 Mil/uL (ref 4.22–5.81)
RDW: 14.2 % (ref 11.5–15.5)
WBC: 6.9 10*3/uL (ref 4.0–10.5)

## 2021-09-20 MED ORDER — PREDNISONE 10 MG PO TABS
ORAL_TABLET | ORAL | 0 refills | Status: AC
Start: 2021-09-20 — End: 2021-10-02
  Filled 2021-09-20: qty 30, 12d supply, fill #0

## 2021-09-20 MED ORDER — ANORO ELLIPTA 62.5-25 MCG/ACT IN AEPB
1.0000 | INHALATION_SPRAY | Freq: Every day | RESPIRATORY_TRACT | 6 refills | Status: DC
  Filled 2021-09-20: qty 60, 30d supply, fill #0
  Filled 2021-10-17: qty 60, 30d supply, fill #1

## 2021-09-20 MED ORDER — MONTELUKAST SODIUM 10 MG PO TABS
10.0000 mg | ORAL_TABLET | Freq: Every day | ORAL | 11 refills | Status: DC
  Filled 2021-09-20: qty 30, 30d supply, fill #0

## 2021-09-20 MED ORDER — STIOLTO RESPIMAT 2.5-2.5 MCG/ACT IN AERS
2.0000 | INHALATION_SPRAY | Freq: Every day | RESPIRATORY_TRACT | 11 refills | Status: DC
  Filled 2021-09-20: qty 4, 30d supply, fill #0

## 2021-09-20 MED ORDER — AMOXICILLIN-POT CLAVULANATE 875-125 MG PO TABS
1.0000 | ORAL_TABLET | Freq: Two times a day (BID) | ORAL | 0 refills | Status: DC
  Filled 2021-09-20: qty 28, 14d supply, fill #0

## 2021-09-20 NOTE — Progress Notes (Signed)
? ?Synopsis: Referred in September 202 for bronchiectasis ? ?Subjective:  ? ?PATIENT ID: Shane Fleming GENDER: male DOB: [birth year], MRN: [id number] ? ?HPI ? ?Chief Complaint  ?Patient presents with  ? Follow-up  ?  F/U after PFT. Increased SOB since last visit.   ? ?Shane Fleming is a 51 year old male, never smoker with bronchiectasis who returns to pulmonary clinic for follow up for bronchiectasis.  ? ?He reports persistent dyspnea and cough. He is not coughing any phlegm up He reports some improvement in his symptoms since switching from dulera to stiolto. He reports decreased appetite over the past couple of weeks. He does have wheezing. He is having a harder time with his work due to the dyspnea.  ? ?PFTs today show mixed obstructive and restrictive defects. No significant bronchodilator response.  ? ?OV 06/20/2021 ?He has done well since last visit on Dulera 100-5 MCG 2 puffs twice daily along with initiation of pantoprazole 40 mg daily for GERD.  He has also been using albuterol nebulizer treatments followed by flutter valve therapy. He reports since the weather has changed he has increased cough and shortness of breath.  He reports a slight increase in sputum production since his cough increased. ? ?OV 03/23/21 ?Sputum culture was positive for Klebsiella pneumonia from expectorated sputum sample.  He completed 14 days of fluoroquinolone therapy and is feeling much better.  His fungal and AFB cultures show no growth to date from a bronchoscopy with BAL on 02/17/2021. ? ?CT chest scan from 02/11/2021 shows diffuse bronchial wall thickening and bilateral lower lobe predominant cylindrical bronchiectasis with mucoid impaction of the right lower airways.  He has multiple scattered pulmonary nodules measuring 5 to 9 mm in size. ? ?He was discharged on home oxygen which she has stopped using over the past 1 week.  He was seen by his primary care provider 03/16/2021, note personally reviewed, and he was provided  [facility] inhaler 100-5 MCG 2 puffs twice daily along with as needed albuterol inhaler and nebulizer solution.  He does have a nebulizer machine at home along with flutter valve.  He is finishing a course of prednisone from his primary care. ? ?He denies any issues with seasonal allergies.  He reports the cold air does bother his breathing in the wintertime. ? ?He is a never smoker but reports secondhand smoke exposure from friends and relatives while growing up.  He is originally from [place].  He denies any significant exposure to charcoal or wood-burning stove's while growing up.  He denies any respiratory issues as a kid or in his younger years.  He reports he is only had a cough over the last 4 years. ? ?Laboratory test reviewed from his hospitalization and they are negative for ANA, rheumatoid factor and ANCA testing.  IgG level is increased at 2000 464 and IgG4 subclass elevated at 106.  IgE level 252. ? ?Past Medical History:  ?Diagnosis Date  ? Sinusitis 2018  ?  ? ?Family History  ?Problem Relation Age of Onset  ? Asthma Father   ?  ? ?Social History  ? ?Socioeconomic History  ? Marital status: Single  ?  Spouse name: Not on file  ? Number of children: Not on file  ? Years of education: Not on file  ? Highest education level: Not on file  ?Occupational History  ? Not on file  ?Tobacco Use  ? Smoking status: Never  ? Smokeless tobacco: Never  ?Substance and Sexual Activity  ? Alcohol use:  No  ? Drug use: No  ? Sexual activity: Not Currently  ?Other Topics Concern  ? Not on file  ?Social History Narrative  ? Not on file  ? ?Social Determinants of Health  ? ?Financial Resource Strain: Not on file  ?Food Insecurity: Not on file  ?Transportation Needs: Not on file  ?Physical Activity: Not on file  ?Stress: Not on file  ?Social Connections: Not on file  ?Intimate Partner Violence: Not on file  ?  ? ?No Known Allergies  ? ?Outpatient Medications Prior to Visit  ?Medication Sig Dispense Refill  ? albuterol (PROVENTIL)  (2.5 MG/3ML) 0.083% nebulizer solution Inhale 3 mLs into the lungs every 4 (four) hours as needed for wheezing or shortness of breath. 90 mL 12  ? albuterol (VENTOLIN HFA) 108 (90 Base) MCG/ACT inhaler Inhale 2 puffs into the lungs every 6 (six) hours as needed for wheezing or shortness of breath (when travelling). 18 g 2  ? Dextromethorphan-guaiFENesin 10-100 MG/5ML liquid Take 5 mLs by mouth every 4 (four) hours as needed for cough. 118 mL 0  ? pantoprazole (PROTONIX) 40 MG tablet Take 1 tablet (40 mg total) by mouth daily. FOR ACID REFLUX 90 tablet 1  ? Tiotropium Bromide-Olodaterol (STIOLTO RESPIMAT) 2.5-2.5 MCG/ACT AERS Inhale 2 puffs into the lungs daily. 4 g 1  ? Tiotropium Bromide-Olodaterol (STIOLTO RESPIMAT) 2.5-2.5 MCG/ACT AERS Inhale 2 puffs into the lungs daily. (Patient not taking: Reported on 08/17/2021) 8 g 0  ? ?No facility-administered medications prior to visit.  ? ? ?Review of Systems  ?Constitutional:  Negative for chills, fever, malaise/fatigue and weight loss.  ?HENT:  Negative for congestion, sinus pain and sore throat.   ?Eyes: Negative.   ?Respiratory:  Positive for cough. Negative for hemoptysis, sputum production, shortness of breath and wheezing.   ?Cardiovascular:  Negative for chest pain, palpitations, orthopnea, claudication and leg swelling.  ?Gastrointestinal:  Negative for abdominal pain, heartburn, nausea and vomiting.  ?Genitourinary: Negative.   ?Musculoskeletal:  Negative for joint pain and myalgias.  ?Skin:  Negative for rash.  ?Neurological:  Negative for weakness.  ?Endo/Heme/Allergies: Negative.   ?Psychiatric/Behavioral: Negative.    ? ?Objective:  ? ?Vitals:  ? 09/20/21 1101  ?BP: 128/84  ?Pulse: 92  ?SpO2: 96%  ?Weight: 145 lb (65.8 kg)  ?Height: _0  (1.854 m)  ? ?Physical Exam ?Constitutional:   ?   General: He is not in acute distress. ?HENT:  ?   Head: Normocephalic and atraumatic.  ?Eyes:  ?   Extraocular Movements: Extraocular movements intact.  ?    Conjunctiva/sclera: Conjunctivae normal.  ?   Pupils: Pupils are equal, round, and reactive to light.  ?Cardiovascular:  ?   Rate and Rhythm: Normal rate and regular rhythm.  ?   Pulses: Normal pulses.  ?   Heart sounds: Normal heart sounds. No murmur heard. ?Pulmonary:  ?   Effort: Pulmonary effort is normal.  ?   Breath sounds: Decreased air movement present.  ?Abdominal:  ?   General: Bowel sounds are normal.  ?   Palpations: Abdomen is soft.  ?Musculoskeletal:  ?   Right lower leg: No edema.  ?   Left lower leg: No edema.  ?Lymphadenopathy:  ?   Cervical: No cervical adenopathy.  ?Skin: ?   General: Skin is warm and dry.  ?Neurological:  ?   General: No focal deficit present.  ?   Mental Status: He is alert.  ?Psychiatric:     ?   Mood and  Affect: Mood normal.     ?   Behavior: Behavior normal.     ?   Thought Content: Thought content normal.     ?   Judgment: Judgment normal.  ? ?CBC ?   ?Component Value Date/Time  ? WBC 5.0 03/16/2021 1531  ? WBC 7.9 02/18/2021 0101  ? RBC 5.21 03/16/2021 1531  ? RBC 4.68 02/18/2021 0101  ? HGB 14.6 03/16/2021 1531  ? HCT 44.2 03/16/2021 1531  ? PLT 307 03/16/2021 1531  ? MCV 85 03/16/2021 1531  ? MCH 28.0 03/16/2021 1531  ? MCH 28.6 02/18/2021 0101  ? MCHC 33.0 03/16/2021 1531  ? MCHC 32.2 02/18/2021 0101  ? RDW 13.1 03/16/2021 1531  ? LYMPHSABS 2.2 03/16/2021 1531  ? MONOABS 0.0 (L) 02/11/2021 1530  ? EOSABS 0.2 03/16/2021 1531  ? BASOSABS 0.0 03/16/2021 1531  ? ?BMP Latest Ref Rng & Units 08/17/2021 03/16/2021 02/18/2021  ?Glucose 70 - 99 mg/dL 112(H) 143(H) 102(H)  ?BUN 6 - 24 mg/dL _0 ?Creatinine 0.76 - 1.27 mg/dL 1.25 1.19 1.16  ?BUN/Creat Ratio 9 - _1 -  ?Sodium 134 - 144 mmol/L 145(H) 146(H) 137  ?Potassium 3.5 - 5.2 mmol/L 4.0 5.0 4.0  ?Chloride 96 - 106 mmol/L 103 102 99  ?CO2 20 - 29 mmol/L 25 27 33(H)  ?Calcium 8.7 - 10.2 mg/dL 9.6 9.8 8.7(L)  ? ?Chest imaging: ?CT Chest 06/15/21 ?1. Diffuse bilateral centrilobular nodules, some with  tree-in-bud ?configuration are again seen with mild lower lung predominant ?cylindrical bronchiectasis, overall improved, however, there are ?some new areas of nodularity. 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

## 2021-09-20 NOTE — Patient Instructions (Addendum)
Continue Stiolto inhaler 2 puffs daily ? ?Use albuterol nebulizer treatment twice daily followed by flutter valve therapy. Can increase to 3 treatments per day if needed. ? ?Continue albuterol inhaler as needed. ? ?We will check lab work today and consider sending you to an Immunologist in the future if needed ? ?Start augmentin antibic 1 tab twice daily for 14 days ? ?Start prednisone taper ?40mg  daily x 3 days ?30mg  daily x 3 days ?20mg  daily x 3 days ?10mg  daily x 3 days ? ?Follow up in 3 months ?

## 2021-09-20 NOTE — Addendum Note (Signed)
Addended by: Melody Comas on: 09/20/2021 03:37 PM ? ? Modules accepted: Orders ? ?

## 2021-09-20 NOTE — Progress Notes (Signed)
Spirometry pre and post/lung volumes performed today. Patient was not able to perform DLCO. ?

## 2021-09-20 NOTE — Patient Instructions (Signed)
Spiromety pre/post and lung volumes performed today. Patient not able to perform DLCO. ?

## 2021-09-21 ENCOUNTER — Other Ambulatory Visit: Payer: Self-pay

## 2021-09-21 NOTE — Telephone Encounter (Signed)
Called and LVM in regards to patient's request for albuterol, unable to get in contact with patient. Medication pended.  ?

## 2021-09-23 LAB — IMMUNOGLOBULINS A/E/G/M, SERUM
IgA/Immunoglobulin A, Serum: 261 mg/dL (ref 90–386)
IgE (Immunoglobulin E), Serum: 116 IU/mL (ref 6–495)
IgM (Immunoglobulin M), Srm: 96 mg/dL (ref 20–172)

## 2021-09-23 LAB — IGG 1, 2, 3, AND 4
IgG (Immunoglobin G), Serum: 2314 mg/dL — ABNORMAL HIGH (ref 603–1613)
IgG, Subclass 1: 1441 mg/dL — ABNORMAL HIGH (ref 248–810)
IgG, Subclass 2: 378 mg/dL (ref 130–555)
IgG, Subclass 3: 160 mg/dL — ABNORMAL HIGH (ref 15–102)
IgG, Subclass 4: 80 mg/dL (ref 2–96)

## 2021-10-07 MED ORDER — ALBUTEROL SULFATE HFA 108 (90 BASE) MCG/ACT IN AERS: 2.0000 | INHALATION_SPRAY | Freq: Four times a day (QID) | RESPIRATORY_TRACT | 3 refills | Status: DC | PRN

## 2021-10-11 NOTE — Telephone Encounter (Signed)
Pt's inhaler was refilled. Nothing further needed. ?

## 2021-10-13 ENCOUNTER — Telehealth: Payer: Self-pay | Admitting: Pulmonary Disease

## 2021-10-13 ENCOUNTER — Other Ambulatory Visit: Payer: Self-pay

## 2021-10-13 DIAGNOSIS — J470 Bronchiectasis with acute lower respiratory infection: Secondary | ICD-10-CM

## 2021-10-13 DIAGNOSIS — R768 Other specified abnormal immunological findings in serum: Secondary | ICD-10-CM

## 2021-10-13 NOTE — Telephone Encounter (Signed)
Received forms from front desk. Will fax application today for patient.  ?

## 2021-10-17 ENCOUNTER — Other Ambulatory Visit: Payer: Self-pay | Admitting: Nurse Practitioner

## 2021-10-17 ENCOUNTER — Other Ambulatory Visit: Payer: Self-pay

## 2021-10-17 DIAGNOSIS — J479 Bronchiectasis, uncomplicated: Secondary | ICD-10-CM

## 2021-10-18 ENCOUNTER — Other Ambulatory Visit: Payer: Self-pay

## 2021-10-18 NOTE — Telephone Encounter (Signed)
Requested medication (s) are due for refill today:  ? ?Requested medication (s) are on the active medication list: No ? ?Last refill:  08/07/21 ? ?Future visit scheduled: Yes ? ?Notes to clinic:  Medication not on list. ? ? ? ?Requested Prescriptions  ?Pending Prescriptions Disp Refills  ? Tiotropium Bromide-Olodaterol (STIOLTO RESPIMAT) 2.5-2.5 MCG/ACT AERS 4 g 1  ?  Sig: Inhale 2 puffs into the lungs daily.  ?  ? Off-Protocol Failed - 10/17/2021  3:01 PM  ?  ?  Failed - Medication not assigned to a protocol, review manually.  ?  ?  Passed - Valid encounter within last 12 months  ?  Recent Outpatient Visits   ? ?      ? 2 months ago Prediabetes  ? [facility] [place], [place] W, NP  ? 5 months ago Bronchiectasis without complication [facility])  ? [facility] [place], [place] W, NP  ? 7 months ago Acute respiratory failure with hypoxia and hypercapnia (HCC)  ? [facility] [place], [place], [place]  ? 4 years ago Chronic cough  ? [facility] [place], [place] W, NP  ? 4 years ago Chronic coughing  ? [facility] Claiborne Rigg, NP  ? ?  ?  ?Future Appointments   ? ?        ? In 2 months Dewald, Bettina Gavia, MD [facility]  ? In 4 months Claiborne Rigg, NP [facility]  ? ?  ? ? ?  ?  ?  ? ?

## 2021-10-19 ENCOUNTER — Other Ambulatory Visit: Payer: Self-pay

## 2021-10-20 ENCOUNTER — Telehealth: Payer: Self-pay | Admitting: Pulmonary Disease

## 2021-10-20 ENCOUNTER — Other Ambulatory Visit: Payer: Self-pay

## 2021-10-20 DIAGNOSIS — J479 Bronchiectasis, uncomplicated: Secondary | ICD-10-CM

## 2021-10-20 DIAGNOSIS — J470 Bronchiectasis with acute lower respiratory infection: Secondary | ICD-10-CM

## 2021-10-20 MED ORDER — ANORO ELLIPTA 62.5-25 MCG/ACT IN AEPB
1.0000 | INHALATION_SPRAY | Freq: Every day | RESPIRATORY_TRACT | 6 refills | Status: DC
  Filled 2021-10-20: qty 60, 60d supply, fill #0

## 2021-10-20 MED ORDER — ALBUTEROL SULFATE HFA 108 (90 BASE) MCG/ACT IN AERS
2.0000 | INHALATION_SPRAY | Freq: Four times a day (QID) | RESPIRATORY_TRACT | 3 refills | Status: DC | PRN
  Filled 2021-10-20: qty 18, 25d supply, fill #0

## 2021-10-20 MED ORDER — ANORO ELLIPTA 62.5-25 MCG/ACT IN AEPB
1.0000 | INHALATION_SPRAY | Freq: Every day | RESPIRATORY_TRACT | 6 refills | Status: DC
  Filled 2021-10-20: qty 60, 60d supply, fill #0
  Filled 2021-11-07: qty 60, 30d supply, fill #0

## 2021-10-20 NOTE — Telephone Encounter (Signed)
Called and spoke with patient. Advised patient that [employer] will be sending a letter to him in the mail letting him know the status of his application. Patient also stated that he needed a refill on his anoro. Patient verified pharmacy and refill has been sent in. Nothing further needed.  ?

## 2021-10-20 NOTE — Telephone Encounter (Signed)
ATC patient. LVMTCB. 

## 2021-10-20 NOTE — Telephone Encounter (Signed)
Called and spoke with pt asking if he had filled out application for pt assistance for the Anoro through [employer] and pt said that it was filled out 4/13 but status has not been determined yet.  Pt said that he was currently out of both meds. Stated to pt that we did not have any samples of the Anoro and asked him if he wanted me to send an Rx for that as well as the Albuterol to [facility] and he stated yes. Rx for both have been sent to pharmacy. Nothing further needed. ?

## 2021-10-20 NOTE — Telephone Encounter (Signed)
The pharmacy called stating that the Pt's Anorro is no longer being filled because pt does not have assistance, and the refills have to come through the [place] program. Pt needs LBPU to follow with gks. Also the PT needs an Rx for the albuterol hfa sent to [facility] pharmacy at [employer]. Please be advised.  ?

## 2021-10-31 ENCOUNTER — Other Ambulatory Visit: Payer: Self-pay

## 2021-11-01 ENCOUNTER — Telehealth: Payer: Self-pay | Admitting: Pulmonary Disease

## 2021-11-01 ENCOUNTER — Other Ambulatory Visit: Payer: Self-pay

## 2021-11-01 NOTE — Telephone Encounter (Signed)
Called patient but he did not answer. Left message for him to call back.  ? ?Called the [facility] pharmacy and was advised that he had refilled his albuterol 11 days ago and he is now out again. Made them aware that I after with him to confirm if it is the Anoro or albuterol, I would call them back.  ? ?Will wait for the patient to call back.  ?

## 2021-11-02 ENCOUNTER — Other Ambulatory Visit: Payer: Self-pay

## 2021-11-07 ENCOUNTER — Other Ambulatory Visit: Payer: Self-pay

## 2021-11-07 NOTE — Telephone Encounter (Signed)
Patient called back and states he is needing Anoro refills send to the pharmacy, not albuterol- I explained to the patient that he should have refills at the pharmacy of anoro. Patient states he will go to the pharmacy to check. Told the patient that if the pharmacy did not have the medication to call us back or have the pharmacy call the office. Patient's call back number is [phone]. ?

## 2021-12-02 ENCOUNTER — Ambulatory Visit (INDEPENDENT_AMBULATORY_CARE_PROVIDER_SITE_OTHER): Payer: Self-pay | Admitting: Internal Medicine

## 2021-12-02 ENCOUNTER — Encounter: Payer: Self-pay | Admitting: Internal Medicine

## 2021-12-02 VITALS — BP 140/70 | HR 99 | Temp 98.3°F | Resp 20 | Ht 70.0 in | Wt 138.6 lb

## 2021-12-02 DIAGNOSIS — J479 Bronchiectasis, uncomplicated: Secondary | ICD-10-CM

## 2021-12-02 DIAGNOSIS — J310 Chronic rhinitis: Secondary | ICD-10-CM

## 2021-12-02 DIAGNOSIS — J449 Chronic obstructive pulmonary disease, unspecified: Secondary | ICD-10-CM

## 2021-12-02 DIAGNOSIS — D892 Hypergammaglobulinemia, unspecified: Secondary | ICD-10-CM

## 2021-12-02 NOTE — Patient Instructions (Addendum)
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  -Breathing test today showed persistent severe inflammation in your lungs (unchanged from March)  -Due to to severity of lung functioning, allergy testing was deferred and we will get blood work done to evaluate for environmental allergies, although I think this is unlikely to be cause of bronchiectasis  -Given elevated IgG, will get SPEP and UPEP for further work-up - Will also do basic immune work up: Flow cytometry, CH50, pneumococcal and tetanus and diptheria titers  -Continue medications prescribed by pulmonary: Anoro 1 puff twice daily -Restart: Dulera 129mcg 2 puffs twice daily, Singulair 10mg  daily for underlying asthma component    GERD -Continue dietary and lifestyle modifications -Continue Protonix 40 mg daily -Consider GI referral for evaluation for GI causes of bronchiectasis    Rhinitis  -We will get blood work for allergy testing and go from there.  -Restart Singulair 10 mg daily,  -Start: Zyrtec 10 mg daily, Flonase 1 spray per nostril daily for medical management  Follow up: we will contact you with blood results and go from there, Follow up with pulmonary as scheduled in June.   Thank you so much for letting me partake in your care today.  Don't hesitate to reach out if you have any additional concerns!  Roney Marion, MD  [facility] and [place], [place]

## 2021-12-02 NOTE — Progress Notes (Addendum)
New Patient Note  RE: Shane Fleming MRN: [id number] DOB: 1970/09/08 Date of Office Visit: 12/02/2021  Consult requested by: Freddi Starr, MD Primary care provider: Gildardo Pounds, NP  Chief Complaint: Asthma (Been tough everyday. Medication doesn't seem to be helping.), Allergic Rhinitis  (Bothers him every day), Other (Elevated immunoglobulin levels), and Allergy Testing  History of Present Illness: I had the pleasure of seeing Shane Fleming for initial evaluation at the [facility] on 12/02/2021. He is a 51 y.o. male, who is referred here by Gildardo Pounds, NP for the evaluation of bronchiectasis and elevated IgE. He follows with Dr. Erin Fulling from [facility] for bronchiectasis, COPD/overlap syndrome.  History obtained from patient.  He does reports itchy watery eyes, sneezing, rhinnorhea, post nasal drip year round.  No prior medications.  Denies animal triggers.  Denies nonallergic trigger.  Symptoms started 4 years ago.  Denies prior allergy testing.    Medical therapy: On albuterol,  Anoro 1 puff daily,  He is also on Protonix 40 mg daily. He has not seen GI. Denies any dysphagia, frank aspiration or heartburn.    He also reports daily wheezing, cough and dyspnea.  Denies any response with any of the inhalers he prescribed.  He was prescribed 14 days of augmentin and prednisone which did help symptoms.  Also reports poor appetite and weight loss.   Previous Diagnostics:  CT Chest 06/15/21 1. Diffuse bilateral centrilobular nodules, some with tree-in-bud configuration are again seen with mild lower lung predominant cylindrical bronchiectasis, overall improved, however, there are some new areas of nodularity. Findings are likely due to chronic atypical infection, diffuse panbronchiolitis is an additional consideration given history of sinusitis. 2. Interval resolution of bilateral lower lobe mucoid impaction. 3. Largest new solid nodule  measures 7 mm in mean diameter. 4. Mildly patulous esophagus, finding can be seen in the setting of esophageal dysmotility.  CT Sinus: 07/27/2017  IMPRESSION: Right maxillary sinusitis with near complete opacification, progressed from 07/27/2017.    PFTS: 09/20/21: mixed obstructive and restrictive defects. No significant bronchodilator response: FVC 2.26, 49%, FEV1 1.09, 29%, ratio 48%  09/30/21; LABS: negative quantiferon gold, ANA, ANCA, IgG 2314, IgG 1 1441, IgG 2 378, IgG 3 160, IgG 4 80; !gA 261, !gM 96, !gE 116; AEC 200   02/16/21: Negative acid-fast culture and smears, negative fungal cultures, neutrophilic predominant cell count from BAL Sputum culture: Klebsiella pneumonia   Assessment and Plan: [name] is a 51 y.o. male with: Asthma-COPD overlap syndrome ([place]) - Plan: Spirometry with Graph  Bronchiectasis without complication ([place]) - Plan: Spirometry with Graph, Strep pneumoniae 23 Serotypes IgG, Diphtheria / Tetanus Antibody Panel, Complement, total, Protein Electrophoresis, (serum), Protein Electro, Random Urine, Allergens w/Total IgE Area 2, Immunoglobulins, QN, A/E/G/M, IgG 1, 2, 3, and 4, Lymphocyte Act. Profile  Serum gammaglobulin increased - Plan: Spirometry with Graph, Strep pneumoniae 23 Serotypes IgG, Diphtheria / Tetanus Antibody Panel, Complement, total, Protein Electrophoresis, (serum), Protein Electro, Random Urine, Allergens w/Total IgE Area 2, Immunoglobulins, QN, A/E/G/M, IgG 1, 2, 3, and 4, Lymphocyte Act. 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  Based on symptoms, imaging I think it is unlikely IgE mediated processes to include rhinitis and asthma is underlying cause of his respiratory disease.  ABPA unlikely given lack of eosinophilia and relatively low IgE.  Its concerning  his IgG is up trending and will get  SPEP/UPEP/flow to  evaluate for possible clonal process.  CMP is reassuring at this time for multiple myeloma but this should be considered in the differential.  Immunodeficiency is a possibility however CVID unlikely with hypergammaglobulinemia.  We will get basic immunodeficiency work-up nevertheless.  Differential also can include alpha-1 antitrypsin deficiency, primary ciliary dyskinesia, cystic fibrosis although imaging is less consistent with these processes   Could not skin test today due to severity of respiratory disease and we will get specific IgE to evaluate for underlying sensitizations. [place] medical therapy for rhinitis initiated.  Given esophageal findings on CT in December could consider GI referral for endoscopy and evaluation for chronic aspiration or dysmotility disorders which may be contributing to bronchiectasis.  Patient appears to have a history of noncompliance with medications and counseled him to restart his Singulair and Dulera that was previously prescribed by pulmonary and follow-up with pulmonary as scheduled.  Plan: Patient Instructions  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  -Breathing test today showed persistent severe inflammation in your lungs (unchanged from March)  -Due to to severity of lung functioning, allergy testing was deferred and we will get blood work done to evaluate for environmental allergies, although I think this is unlikely to be cause of bronchiectasis  -Given elevated IgG, will get SPEP and UPEP for further work-up - Will also do basic immune work up: Flow cytometry, CH50, pneumococcal and tetanus and diptheria titers  -Continue medications prescribed by pulmonary: Anoro 1 puff twice daily -Restart: Dulera 195mg 2 puffs twice daily, Singulair 140mdaily for underlying asthma component    GERD -Continue dietary and lifestyle modifications -Continue Protonix 40 mg daily -Consider GI referral for evaluation for GI causes of bronchiectasis     Rhinitis  -We will get blood work for allergy testing and go from there.  -Restart Singulair 10 mg daily,  -Start: Zyrtec 10 mg daily, Flonase 1 spray per nostril daily for medical management  Follow up: we will contact you with blood results and go from there, Follow up with pulmonary as scheduled in June.   Thank you so much for letting me partake in your care today.  Don't hesitate to reach out if you have any additional concerns!  EvRoney MarionMD  [facility] [place] [place]    No follow-ups on file.  No orders of the defined types were placed in this encounter.  Lab Orders         Strep pneumoniae 23 Serotypes IgG         Diphtheria / Tetanus Antibody Panel         Complement, total         Protein Electrophoresis, (serum)         Protein Electro, Random Urine         Allergens w/Total IgE Area 2         Immunoglobulins, QN, A/E/G/M         IgG 1, 2, 3, and 4         Lymphocyte Act. Profile      Other allergy screening: Asthma: yes Rhino conjunctivitis: yes Food allergy: no Medication allergy: no Hymenoptera allergy: no Urticaria: no Eczema:no History of recurrent infections suggestive of immunodeficency: yes  Diagnostics: Spirometry:  Tracings reviewed. His effort: Good reproducible efforts. FVC: 1.88 L FEV1: 1.06 L, 32% predicted FEV1/FVC ratio: 56% Interpretation: Spirometry consistent with mixed obstructive and restrictive disease.  Unchanged from prior Please see scanned spirometry results for details.  Results interpreted by myself and discussed with patient/family.   Past Medical History: Patient Active Problem List   Diagnosis Date Noted   Protein-calorie malnutrition, severe 02/16/2021   Acute respiratory failure with hypoxia and hypercapnia (HCC) 02/11/2021   Bronchiectasis ([place]) 02/11/2021   Weight loss 02/11/2021   Pulmonary infiltrates 07/24/2017   Chronic cough 07/24/2017   Acute perforated appendicitis 04/08/2015    Appendicitis 04/07/2015   Past Medical History:  Diagnosis Date   Asthma    Sinusitis 2018   Past Surgical History: Past Surgical History:  Procedure Laterality Date   APPENDECTOMY     BRONCHIAL WASHINGS  02/17/2021   Procedure: BRONCHIAL WASHINGS;  Surgeon: Freddi Starr, MD;  Location: [facility] ENDOSCOPY;  Service: Pulmonary;;   LAPAROSCOPIC APPENDECTOMY N/A 04/08/2015   Procedure: APPENDECTOMY LAPAROSCOPIC;  Surgeon: Excell Seltzer, MD;  Location: [facility] ORS;  Service: General;  Laterality: N/A;   VIDEO BRONCHOSCOPY N/A 02/17/2021   Procedure: VIDEO BRONCHOSCOPY WITHOUT FLUORO;  Surgeon: Freddi Starr, MD;  Location[facility] ENDOSCOPY;  Service: Pulmonary;  Laterality: N/A;   Medication List:  Current Outpatient Medications  Medication Sig Dispense Refill   pantoprazole (PROTONIX) 40 MG tablet Take 40 mg by mouth daily.     umeclidinium-vilanterol (ANORO ELLIPTA) 62.5-25 MCG/ACT AEPB Inhale 1 puff into the lungs daily. 60 each 6   albuterol (PROVENTIL) (2.5 MG/3ML) 0.083% nebulizer solution Inhale 3 mLs into the lungs every 4 (four) hours as needed for wheezing or shortness of breath. (Patient not taking: Reported on 12/02/2021) 90 mL 12   albuterol (VENTOLIN HFA) 108 (90 Base) MCG/ACT inhaler Inhale 2 puffs into the lungs every 6 (six) hours as needed for wheezing or shortness of breath (when travelling). (Patient not taking: Reported on 12/02/2021) 54 g 3   amoxicillin-clavulanate (AUGMENTIN) 875-125 MG tablet Take 1 tablet by mouth 2 (two) times daily. (Patient not taking: Reported on 12/02/2021) 28 tablet 0   Dextromethorphan-guaiFENesin 10-100 MG/5ML liquid Take 5 mLs by mouth every 4 (four) hours as needed for cough. (Patient not taking: Reported on 12/02/2021) 118 mL 0   montelukast (SINGULAIR) 10 MG tablet Take 1 tablet (10 mg total) by mouth at bedtime. (Patient not taking: Reported on 12/02/2021) 30 tablet 11   No current facility-administered medications for this visit.    Allergies: No Known Allergies Social History: Social History   Socioeconomic History   Marital status: Single    Spouse name: Not on file   Number of children: Not on file   Years of education: Not on file   Highest education level: Not on file  Occupational History   Not on file  Tobacco Use   Smoking status: Never    Passive exposure: Never   Smokeless tobacco: Never  Vaping Use   Vaping Use: Never used  Substance and Sexual Activity   Alcohol use: No   Drug use: No   Sexual activity: Not Currently  Other Topics Concern   Not on file  Social History Narrative   Not on file   Social Determinants of Health   Financial Resource Strain: Not on file  Food Insecurity: Not on file  Transportation Needs: Not on file  Physical Activity: Not on file  Stress: Not on file  Social Connections: Not on file   Lives in a apartment, there are no roaches in the house and bed is 2 feet off the floor.  She does not use dust mite precautions on better pillow and does not have a HEPA filter.  He is not exposed to fumes, chemicals or dust at work.  Does not live near an interstate or industrial area.. Smoking: Never smoker, does report secondhand smoke exposure Occupation: Works in Administrator, sports History: Environmental education officer in the house: yes Carpet in the family room: yes Carpet in the bedroom: yes Heating: electric Cooling: central Pet: yes with access to bedroom  Family History: Family History  Problem Relation Age of Onset   Asthma Father      ROS: All others negative except as noted per HPI.   Objective: BP 140/70   Pulse 99   Temp 98.3 F (36.8 C) (Temporal)   Resp 20   Ht _0  (1.778 m)   Wt 138 lb 9.6 oz (62.9 kg)   SpO2 91%   BMI 19.89 kg/m  Body mass index is 19.89 kg/m.  General Appearance:  Alert, cooperative, no distress, thin appearing   Head:  Normocephalic, without obvious abnormality, atraumatic  Eyes:  Conjunctiva clear,  EOM's intact  Nose: Nares normal, normal mucosa, no visible anterior polyps, and septum midline  Throat: Lips, tongue normal; teeth and gums normal, normal posterior oropharynx and no tonsillar exudate  Neck: Supple, symmetrical  Lungs:   Slightly tachypeniec, prolonged expiratory phase, end-expiratory wheezing, and wheezing throughout, Respirations unlabored, no coughing  Heart:  regular rate and rhythm and no murmur, Appears well perfused  Extremities: No edema  Skin: Skin color, texture, turgor normal, no rashes or lesions on visualized portions of skin  Neurologic: No gross deficits   The plan was reviewed with the patient/family, and all questions/concerned were addressed.  It was my pleasure to see Ferguson today and participate in his care. Please feel free to contact me with any questions or concerns.  Sincerely,  Roney Marion, MD Allergy & Immunology  [facility] office: [phone] [place] office: [phone]

## 2021-12-08 LAB — LYMPHOCYTE ACT. PROFILE
% CD 3 Pos. Lymph.: 65.9 % (ref 57.5–86.2)
% CD 4 Pos. Lymph.: 33.4 % (ref 30.8–58.5)
% CD 8 Pos. Lymph.: 30.7 % (ref 12.0–35.5)
% CD3+CD25+ Lymphs: 6.6 % (ref 4.9–25.9)
% CD8+CD57+ Lymphs: 21.5 % — ABNORMAL HIGH (ref 0.0–11.3)
Abs.CD3+CD25+ Lymphs: 106 /uL (ref 79–535)
Abs.CD8+CD57+ Lymphs: 344 /uL — ABNORMAL HIGH (ref 0–254)
Absolute CD 3: 1054 /uL (ref 622–2402)
Absolute CD 4 Helper: 534 /uL (ref 359–1519)
Absolute CD 8 (Supp): 491 /uL (ref 109–897)
Basophils Absolute: 0 10*3/uL (ref 0.0–0.2)
Basos: 1 %
CD4/CD8 Ratio: 1.09 (ref 0.92–3.72)
EOS (ABSOLUTE): 0.2 10*3/uL (ref 0.0–0.4)
Eos: 4 %
Hematocrit: 44.9 % (ref 37.5–51.0)
Hemoglobin: 15.2 g/dL (ref 13.0–17.7)
Immature Grans (Abs): 0 10*3/uL (ref 0.0–0.1)
Immature Granulocytes: 0 %
Lymphocytes Absolute: 1.6 10*3/uL (ref 0.7–3.1)
Lymphs: 32 %
MCH: 28.8 pg (ref 26.6–33.0)
MCHC: 33.9 g/dL (ref 31.5–35.7)
MCV: 85 fL (ref 79–97)
Monocytes Absolute: 0.5 10*3/uL (ref 0.1–0.9)
Monocytes: 9 %
Neutrophils Absolute: 2.8 10*3/uL (ref 1.4–7.0)
Neutrophils: 54 %
Platelets: 333 10*3/uL (ref 150–450)
RBC: 5.27 x10E6/uL (ref 4.14–5.80)
RDW: 13.3 % (ref 11.6–15.4)
WBC: 5.1 10*3/uL (ref 3.4–10.8)

## 2021-12-08 LAB — STREP PNEUMONIAE 23 SEROTYPES IGG
Pneumo Ab Type 1*: 0.3 ug/mL — ABNORMAL LOW (ref >1.3)
Pneumo Ab Type 12 (12F)*: <0.1 ug/mL — ABNORMAL LOW (ref >1.3)
Pneumo Ab Type 14*: 0.4 ug/mL — ABNORMAL LOW (ref >1.3)
Pneumo Ab Type 17 (17F)*: <0.1 ug/mL — ABNORMAL LOW (ref >1.3)
Pneumo Ab Type 19 (19F)*: 1.5 ug/mL (ref >1.3)
Pneumo Ab Type 2*: 0.8 ug/mL — ABNORMAL LOW (ref >1.3)
Pneumo Ab Type 20*: 0.5 ug/mL — ABNORMAL LOW (ref >1.3)
Pneumo Ab Type 22 (22F)*: 0.5 ug/mL — ABNORMAL LOW (ref >1.3)
Pneumo Ab Type 23 (23F)*: <0.1 ug/mL — ABNORMAL LOW (ref >1.3)
Pneumo Ab Type 26 (6B)*: <0.1 ug/mL — ABNORMAL LOW (ref >1.3)
Pneumo Ab Type 3*: 0.5 ug/mL — ABNORMAL LOW (ref >1.3)
Pneumo Ab Type 34 (10A)*: 6.7 ug/mL (ref >1.3)
Pneumo Ab Type 4*: 0.3 ug/mL — ABNORMAL LOW (ref >1.3)
Pneumo Ab Type 43 (11A)*: <0.1 ug/mL — ABNORMAL LOW (ref >1.3)
Pneumo Ab Type 5*: 0.3 ug/mL — ABNORMAL LOW (ref >1.3)
Pneumo Ab Type 51 (7F)*: 0.6 ug/mL — ABNORMAL LOW (ref >1.3)
Pneumo Ab Type 54 (15B)*: 1.1 ug/mL — ABNORMAL LOW (ref >1.3)
Pneumo Ab Type 56 (18C)*: <0.1 ug/mL — ABNORMAL LOW (ref >1.3)
Pneumo Ab Type 57 (19A)*: 0.5 ug/mL — ABNORMAL LOW (ref >1.3)
Pneumo Ab Type 68 (9V)*: 1.4 ug/mL (ref >1.3)
Pneumo Ab Type 70 (33F)*: 8.4 ug/mL (ref >1.3)
Pneumo Ab Type 8*: 4.4 ug/mL (ref >1.3)
Pneumo Ab Type 9 (9N)*: 0.3 ug/mL — ABNORMAL LOW (ref >1.3)

## 2021-12-08 LAB — ALLERGENS W/TOTAL IGE AREA 2
Alternaria Alternata IgE: <0.1 kU/L
Aspergillus Fumigatus IgE: <0.1 kU/L
Bermuda Grass IgE: 3.24 kU/L — AB
Cat Dander IgE: <0.1 kU/L
Cedar, Mountain IgE: 1.88 kU/L — AB
Cladosporium Herbarum IgE: 0.12 kU/L — AB
Cockroach, German IgE: 1.72 kU/L — AB
Common Silver Birch IgE: 1.95 kU/L — AB
Cottonwood IgE: 2.23 kU/L — AB
D Farinae IgE: 0.87 kU/L — AB
D Pteronyssinus IgE: <0.1 kU/L
Dog Dander IgE: 0.21 kU/L — AB
Elm, American IgE: 2.94 kU/L — AB
Johnson Grass IgE: 2.66 kU/L — AB
Maple/Box Elder IgE: 2.94 kU/L — AB
Mouse Urine IgE: <0.1 kU/L
Oak, White IgE: 2.5 kU/L — AB
Pecan, Hickory IgE: 33 kU/L — AB
Penicillium Chrysogen IgE: <0.1 kU/L
Pigweed, Rough IgE: 2.43 kU/L — AB
Ragweed, Short IgE: 2.71 kU/L — AB
Sheep Sorrel IgE Qn: 3.06 kU/L — AB
Timothy Grass IgE: 2.93 kU/L — AB
White Mulberry IgE: 1.98 kU/L — AB

## 2021-12-08 LAB — DIPHTHERIA / TETANUS ANTIBODY PANEL
Diphtheria Ab: 0.76 IU/mL (ref <0.10)
Tetanus Ab, IgG: <0.1 IU/mL — ABNORMAL LOW (ref <0.10)

## 2021-12-08 LAB — PROTEIN ELECTRO, RANDOM URINE
Albumin ELP, Urine: 15.8 %
Alpha-1-Globulin, U: 3.3 %
Alpha-2-Globulin, U: 27.4 %
Beta Globulin, U: 24.5 %
Gamma Globulin, U: 28.9 %
Protein, Ur: 21.1 mg/dL

## 2021-12-08 LAB — PROTEIN ELECTROPHORESIS, SERUM
A/G Ratio: 0.8 (ref 0.7–1.7)
Albumin ELP: 3.8 g/dL (ref 2.9–4.4)
Alpha 1: 0.3 g/dL (ref 0.0–0.4)
Alpha 2: 0.8 g/dL (ref 0.4–1.0)
Beta: 1 g/dL (ref 0.7–1.3)
Gamma Globulin: 2.4 g/dL — ABNORMAL HIGH (ref 0.4–1.8)
Globulin, Total: 4.5 g/dL — ABNORMAL HIGH (ref 2.2–3.9)
Total Protein: 8.3 g/dL (ref 6.0–8.5)

## 2021-12-08 LAB — IMMUNOGLOBULINS A/E/G/M, SERUM
IgA/Immunoglobulin A, Serum: 273 mg/dL (ref 90–386)
IgE (Immunoglobulin E), Serum: 257 IU/mL (ref 6–495)
IgM (Immunoglobulin M), Srm: 91 mg/dL (ref 20–172)

## 2021-12-08 LAB — IGG 1, 2, 3, AND 4
IgG (Immunoglobin G), Serum: 2852 mg/dL — ABNORMAL HIGH (ref 603–1613)
IgG, Subclass 1: 1862 mg/dL — ABNORMAL HIGH (ref 248–810)
IgG, Subclass 2: 431 mg/dL (ref 130–555)
IgG, Subclass 3: 163 mg/dL — ABNORMAL HIGH (ref 15–102)
IgG, Subclass 4: 111 mg/dL — ABNORMAL HIGH (ref 2–96)

## 2021-12-08 LAB — COMPLEMENT, TOTAL: Compl, Total (CH50): >60 U/mL (ref >41)

## 2021-12-13 NOTE — Progress Notes (Signed)
Patients vaccine titers returned low to both step pneumoniae and tetanus.  He will need to get a Pneumovax (PSV23) and Tdap vaccine and get repeat vaccine tiers in 4-6 weeks. He can get these vaccines at his Primary Care.    His allergy testing returned postive to dust mite, dog, grass, mold, tree, weed.  Allergies may be playing a part in his symptoms, but I do not think it explains all of his lung disease.  He should follow up with Pulmonary and I will let his pulmonologist know the results and my recommendations. Can you let this patient know?  Thanks! EML

## 2021-12-21 ENCOUNTER — Encounter: Payer: Self-pay | Admitting: Pulmonary Disease

## 2021-12-21 ENCOUNTER — Ambulatory Visit (INDEPENDENT_AMBULATORY_CARE_PROVIDER_SITE_OTHER): Payer: Self-pay | Admitting: Pulmonary Disease

## 2021-12-21 VITALS — BP 138/84 | HR 91 | Ht 70.0 in | Wt 143.2 lb

## 2021-12-21 DIAGNOSIS — J449 Chronic obstructive pulmonary disease, unspecified: Secondary | ICD-10-CM

## 2021-12-21 DIAGNOSIS — J479 Bronchiectasis, uncomplicated: Secondary | ICD-10-CM

## 2021-12-21 MED ORDER — ANORO ELLIPTA 62.5-25 MCG/ACT IN AEPB
1.0000 | INHALATION_SPRAY | Freq: Every day | RESPIRATORY_TRACT | 3 refills | Status: DC
Start: 2021-12-21 — End: 2022-08-18

## 2021-12-21 NOTE — Addendum Note (Signed)
Addended by: Maurene Capes on: 12/21/2021 09:57 AM   Modules accepted: Orders

## 2021-12-21 NOTE — Progress Notes (Signed)
Synopsis: Referred in September 202 for bronchiectasis  Subjective:   PATIENT ID: [name] GENDER: male DOB: [birth year], MRN: [id number]  HPI  Chief Complaint  Patient presents with   Follow-up    3 mo f/u. States unable to avoid Anoro.    Lizandro Nielson is a 51 year old male, never smoker with bronchiectasis who returns to pulmonary clinic for follow up for bronchiectasis and asthma.   He was referred to Allergy Immunology at last visit for evaluation of abnormal immunoglobulin levels with elevated IgG. He had allergy testing performed with elevated IgE levels to dust mite, dog, grass, mold, tree, weed. He had low titers to strep pneumoniae and tetanus. He was recommended to get updated vaccines and plan for repeated titers. He was recommended to resume dulera 100-9mg 2 puffs twice daily along with his anoro 1 puff daily. He was recommended to start  singulair 189mdaily and zyrtec 1035maily for allergies.   He has been spending $500 on anoro the last couple of months as he does not have insurance coverage. He has not started dulera, singulair or zyrtec due to affordability.   His breathing as been stable due to it being summer time.   OV 09/20/21 He reports persistent dyspnea and cough. He is not coughing any phlegm up He reports some improvement in his symptoms since switching from dulera to stiolto. He reports decreased appetite over the past couple of weeks. He does have wheezing. He is having a harder time with his work due to the dyspnea.   PFTs today show mixed obstructive and restrictive defects. No significant bronchodilator response.   OV 06/20/2021 He has done well since last visit on Dulera 100-5 MCG 2 puffs twice daily along with initiation of pantoprazole 40 mg daily for GERD.  He has also been using albuterol nebulizer treatments followed by flutter valve therapy. He reports since the weather has changed he has increased cough and shortness of breath.  He reports  a slight increase in sputum production since his cough increased.  OV 03/23/21 Sputum culture was positive for Klebsiella pneumonia from expectorated sputum sample.  He completed 14 days of fluoroquinolone therapy and is feeling much better.  His fungal and AFB cultures show no growth to date from a bronchoscopy with BAL on 02/17/2021.  CT chest scan from 02/11/2021 shows diffuse bronchial wall thickening and bilateral lower lobe predominant cylindrical bronchiectasis with mucoid impaction of the right lower airways.  He has multiple scattered pulmonary nodules measuring 5 to 9 mm in size.  He was discharged on home oxygen which she has stopped using over the past 1 week.  He was seen by his primary care provider 03/16/2021, note personally reviewed, and [facility] twice [facility] along with as needed albuterol inhaler and nebulizer solution.  He does have a nebulizer machine at home along with flutter valve.  He is finishing a course of prednisone from his primary care.  He denies any issues with seasonal allergies.  He reports the cold air does bother his breathing in the wintertime.  He is a never smoker but reports secondhand smoke exposure from friends and relatives while growing up.  [place] or wood-burning stove's while growing up.  He denies any respiratory issues as a kid or in his younger years.  He reports he is only had a cough over the last 4 years.  Laboratory test reviewed from his hospitalization and they are negative for ANA, rheumatoid factor and ANCA testing.  IgG level is increased at 2000 464 and IgG4 subclass elevated at 106.  IgE level 252.  Past Medical History:  Diagnosis Date   Asthma    Sinusitis 2018     Family History  Problem Relation Age of Onset   Asthma Father      Social History   Socioeconomic History   Marital status: Single    Spouse name: Not on file    Number of children: Not on file   Years of education: Not on file   Highest education level: Not on file  Occupational History   Not on file  Tobacco Use   Smoking status: Never    Passive exposure: Never   Smokeless tobacco: Never  Vaping Use   Vaping Use: Never used  Substance and Sexual Activity   Alcohol use: No   Drug use: No   Sexual activity: Not Currently  Other Topics Concern   Not on file  Social History Narrative   Not on file   Social Determinants of Health   Financial Resource Strain: Not on file  Food Insecurity: Not on file  Transportation Needs: Not on file  Physical Activity: Not on file  Stress: Not on file  Social Connections: Not on file  Intimate Partner Violence: Not on file     No Known Allergies   Outpatient Medications Prior to Visit  Medication Sig Dispense Refill   albuterol (PROVENTIL) (2.5 MG/3ML) 0.083% nebulizer solution Inhale 3 mLs into the lungs every 4 (four) hours as needed for wheezing or shortness of breath. 90 mL 12   albuterol (VENTOLIN HFA) 108 (90 Base) MCG/ACT inhaler Inhale 2 puffs into the lungs every 6 (six) hours as needed for wheezing or shortness of breath (when travelling). 54 g 3   umeclidinium-vilanterol (ANORO ELLIPTA) 62.5-25 MCG/ACT AEPB Inhale 1 puff into the lungs daily. 60 each 6   montelukast (SINGULAIR) 10 MG tablet Take 1 tablet (10 mg total) by mouth at bedtime. 30 tablet 11   pantoprazole (PROTONIX) 40 MG tablet Take 40 mg by mouth daily.     amoxicillin-clavulanate (AUGMENTIN) 875-125 MG tablet Take 1 tablet by mouth 2 (two) times daily. (Patient not taking: Reported on 12/02/2021) 28 tablet 0   Dextromethorphan-guaiFENesin 10-100 MG/5ML liquid Take 5 mLs by mouth every 4 (four) hours as needed for cough. (Patient not taking: Reported on 12/02/2021) 118 mL 0   No facility-administered medications prior to visit.    Review of Systems  Constitutional:  Negative for chills, fever, malaise/fatigue and weight  loss.  HENT:  Negative for congestion, sinus pain and sore throat.   Eyes: Negative.   Respiratory:  Positive for cough. Negative for hemoptysis, sputum production, shortness of breath and wheezing.   Cardiovascular:  Negative for chest pain, palpitations, orthopnea, claudication and leg swelling.  Gastrointestinal:  Negative for abdominal pain, heartburn, nausea and vomiting.  Genitourinary: Negative.   Musculoskeletal:  Negative for joint pain and myalgias.  Skin:  Negative for rash.  Neurological:  Negative for weakness.  Endo/Heme/Allergies: Negative.   Psychiatric/Behavioral: Negative.      Objective:   Vitals:   12/21/21 0923  BP: 138/84  Pulse: 91  SpO2: 99%  Weight: 143 lb 3.2 oz (65 kg)  Height: _0  (1.778 m)   Physical Exam Constitutional:      General: He is not in acute distress. HENT:  Head: Normocephalic and atraumatic.  Eyes:     Conjunctiva/sclera: Conjunctivae normal.  Cardiovascular:     Rate and Rhythm: Normal rate and regular rhythm.     Pulses: Normal pulses.     Heart sounds: Normal heart sounds. No murmur heard. Pulmonary:     Effort: Pulmonary effort is normal.     Breath sounds: Decreased air movement present.  Musculoskeletal:     Right lower leg: No edema.     Left lower leg: No edema.  Skin:    General: Skin is warm and dry.  Neurological:     General: No focal deficit present.     Mental Status: He is alert.  Psychiatric:        Mood and Affect: Mood normal.        Behavior: Behavior normal.        Thought Content: Thought content normal.        Judgment: Judgment normal.    CBC    Component Value Date/Time   WBC 5.1 12/02/2021 1021   WBC 6.9 09/20/2021 1128   RBC 5.27 12/02/2021 1021   RBC 4.92 09/20/2021 1128   HGB 15.2 12/02/2021 1021   HCT 44.9 12/02/2021 1021   PLT 333 12/02/2021 1021   MCV 85 12/02/2021 1021   MCH 28.8 12/02/2021 1021   MCH 28.6 02/18/2021 0101   MCHC 33.9 12/02/2021 1021   MCHC 32.6  09/20/2021 1128   RDW 13.3 12/02/2021 1021   LYMPHSABS 1.6 12/02/2021 1021   MONOABS 0.6 09/20/2021 1128   EOSABS 0.2 12/02/2021 1021   BASOSABS 0.0 12/02/2021 1021      Latest Ref Rng & Units 08/17/2021   11:43 AM 03/16/2021    3:31 PM 02/18/2021    1:01 AM  BMP  Glucose 70 - 99 mg/dL 112  143  102   BUN 6 - 24 mg/dL _0 Creatinine 0.76 - 1.27 mg/dL 1.25  1.19  1.16   BUN/Creat Ratio 9 - _1 Sodium 134 - 144 mmol/L 145  146  137   Potassium 3.5 - 5.2 mmol/L 4.0  5.0  4.0   Chloride 96 - 106 mmol/L 103  102  99   CO2 20 - 29 mmol/L 25  27  33   Calcium 8.7 - 10.2 mg/dL 9.6  9.8  8.7    Chest imaging: CT Chest 06/15/21 1. Diffuse bilateral centrilobular nodules, some with tree-in-bud configuration are again seen with mild lower lung predominant cylindrical bronchiectasis, overall improved, however, there are some new areas of nodularity. Findings are likely due to chronic atypical infection, diffuse panbronchiolitis is an additional consideration given history of sinusitis. 2. Interval resolution of bilateral lower lobe mucoid impaction. 3. Largest new solid nodule measures 7 mm in mean diameter. Non-contrast chest CT at 6-12 months is recommended. If the nodule is stable at time of repeat CT, then future CT at 18-24 months (from today's scan) is considered optional for low-risk patients, but is recommended for high-risk patients. This recommendation follows the consensus statement: Guidelines for Management of Incidental Pulmonary Nodules Detected on CT Images: From the Fleischner Society 2017; Radiology 2017; 284:228-243. 4. Mildly patulous esophagus, finding can be seen in the setting of esophageal dysmotility.  CT chest 02/11/2021  diffuse bronchial wall thickening and bilateral lower lobe predominant cylindrical bronchiectasis with mucoid impaction of the right lower airways.  He has multiple scattered pulmonary nodules measuring 5 to 9 mm  in  size.  PFT:    Latest Ref Rng & Units 09/20/2021    9:56 AM  PFT Results  FVC-Pre L 2.26   FVC-Predicted Pre % 49   FVC-Post L 2.32   FVC-Predicted Post % 50   Pre FEV1/FVC % % 48   Post FEV1/FCV % % 50   FEV1-Pre L 1.09   FEV1-Predicted Pre % 29   FEV1-Post L 1.17   TLC L 5.63   TLC % Predicted % 74   RV % Predicted % 107     Echo 02/16/21: LV EF 60-65%. RV normal function and size.   Labs: Valma Cava 02/14/21 negative  Assessment & Plan:   Bronchiectasis without complication (HCC)  Asthma-COPD overlap syndrome ([place])  Discussion: Alfonza Toft is a 51 year old male, never smoker with bronchiectasis who returns to pulmonary clinic for follow up for bronchiectasis.   Patient has asthma-COPD overlap syndrome with bronchiectasis. His breathing is stable at this time with as needed use of anoro due to affordability.  We will send in patient assistance paper work today to help him get anoro covered. We will message his primary care team about getting him a prescription drug plan. He will benefit from biologic therapy for his severe asthma component and elevated IgE level.   Follow up in 4 months.   Freda Jackson, MD [facility] Office: [phone]    Current Outpatient Medications:    albuterol (PROVENTIL) (2.5 MG/3ML) 0.083% nebulizer solution, Inhale 3 mLs into the lungs every 4 (four) hours as needed for wheezing or shortness of breath., Disp: 90 mL, Rfl: 12   albuterol (VENTOLIN HFA) 108 (90 Base) MCG/ACT inhaler, Inhale 2 puffs into the lungs every 6 (six) hours as needed for wheezing or shortness of breath (when travelling)., Disp: 54 g, Rfl: 3   umeclidinium-vilanterol (ANORO ELLIPTA) 62.5-25 MCG/ACT AEPB, Inhale 1 puff into the lungs daily., Disp: 60 each, Rfl: 6

## 2021-12-21 NOTE — Patient Instructions (Addendum)
We will message your primary clinic to see if we can get you setup with a form of insurance to help with getting you the medicines that you need.   Continue anoro 1 puff daily  Continue albuterol inhaler 1-2 puffs every 4--6 hours as needed.   Ok to stay off pantoprazole for the heartburn/reflux  Follow up in 4 months.

## 2021-12-26 ENCOUNTER — Telehealth: Payer: Self-pay | Admitting: Pulmonary Disease

## 2021-12-26 NOTE — Telephone Encounter (Signed)
Please schedule patient with [facility] to work on starting Dupixent for his asthma and allergies. He will need patient assistance program.  Thank you, Cletis Athens

## 2021-12-28 NOTE — Telephone Encounter (Signed)
Called and spoke with patient. He has been scheduled for 12/29/21 at 320pm with [facility].

## 2021-12-29 ENCOUNTER — Ambulatory Visit: Payer: Self-pay | Admitting: Pharmacist

## 2021-12-29 ENCOUNTER — Telehealth: Payer: Self-pay | Admitting: Pharmacist

## 2021-12-29 DIAGNOSIS — Z7189 Other specified counseling: Secondary | ICD-10-CM

## 2021-12-29 NOTE — Progress Notes (Signed)
HPI Patient presents today to [facility] to see pharmacy team for Dupixent new start.  Past medical history includes bronchiectasis, asthma  Number of hospitalizations in past year: 1 (acute respiratory failure) Number of COPD/asthma exacerbations in past year: 4 prednisone tapers  Respiratory Medications Current regimen: Anoro Ellipta (as needed due to cost) Tried in past: Dulera Patient reports adherence challenges unable to afford inhalers  OBJECTIVE No Known Allergies  Outpatient Encounter Medications as of 12/29/2021  Medication Sig   albuterol (PROVENTIL) (2.5 MG/3ML) 0.083% nebulizer solution Inhale 3 mLs into the lungs every 4 (four) hours as needed for wheezing or shortness of breath.   albuterol (VENTOLIN HFA) 108 (90 Base) MCG/ACT inhaler Inhale 2 puffs into the lungs every 6 (six) hours as needed for wheezing or shortness of breath (when travelling).   umeclidinium-vilanterol (ANORO ELLIPTA) 62.5-25 MCG/ACT AEPB Inhale 1 puff into the lungs daily.   umeclidinium-vilanterol (ANORO ELLIPTA) 62.5-25 MCG/ACT AEPB Inhale 1 puff into the lungs daily.   No facility-administered encounter medications on file as of 12/29/2021.     Immunization History  Administered Date(s) Administered   Influenza,inj,Quad PF,6+ Mos 04/11/2015, 06/05/2017, 05/16/2021     PFTs    Latest Ref Rng & Units 09/20/2021    9:56 AM  PFT Results  FVC-Pre L 2.26   FVC-Predicted Pre % 49   FVC-Post L 2.32   FVC-Predicted Post % 50   Pre FEV1/FVC % % 48   Post FEV1/FCV % % 50   FEV1-Pre L 1.09   FEV1-Predicted Pre % 29   FEV1-Post L 1.17   TLC L 5.63   TLC % Predicted % 74   RV % Predicted % 107      Eosinophils Most recent blood eosinophil count was 0.2 cells/microL taken on 09/20/21.   IgE: 257 on 12/02/21   Assessment   Biologics training for dupilumab (Dupixent)  Goals of therapy: Mechanism: human monoclonal IgG4 antibody that inhibits interleukin-4 and interleukin-13  cytokine-induced responses, including release of proinflammatory cytokines, chemokines, and IgE Reviewed that Dupixent is add-on medication and patient must continue maintenance inhaler regimen. Response to therapy: may take 4 months to determine efficacy. Discussed that patients generally feel improvement sooner than 4 months.  Side effects: injection site reaction (6-18%), antibody development (5-16%), ophthalmic conjunctivitis (2-16%), transient blood eosinophilia (1-2%)  Dose: 600mg  at Week 0 (administered today in clinic) followed by 300mg  every 14 days thereafter  Administration/Storage:  Reviewed administration sites of thigh or abdomen (at least 2-3 inches away from abdomen). Reviewed the upper arm is only appropriate if caregiver is administering injection  Do not shake pen/syringe as this could lead to product foaming or precipitation. Do not use if solution is discolored or contains particulate matter or if window on prefilled pen is yellow (indicates pen has been used).  Reviewed storage of medication in refrigerator. Reviewed that Dupixent can be stored at room temperature in unopened carton for up to 14 days.  Medication Reconciliation  A drug regimen assessment was performed, including review of allergies, interactions, disease-state management, dosing and immunization history. Medications were reviewed with the patient, including name, instructions, indication, goals of therapy, potential side effects, importance of adherence, and safe use.  Drug interaction(s): none noted  PLAN Patient uninsured. Will have to do Dupixent MyWay patient assistance. Patient signed form today, form has been placed in Dr. box for signature. Patient will return to the clinic to complete first dose (loading dose) together and will require 30 minute monitoring  Continue maintenance asthma regimen of: Anoro Ellipta 1 puff daily  All questions encouraged and answered.  Instructed patient to  reach out with any further questions or concerns.  Thank you for allowing pharmacy to participate in this patient's care.  This appointment required 20 minutes of patient care (this includes precharting, chart review, review of results, face-to-face care, etc.).  Sherald Hess, PharmD Candidate 12/29/2021 3:47 PM

## 2021-12-29 NOTE — Telephone Encounter (Signed)
Patient had clinic appt with pharmacy team. He is uninsured. Signed his portion of Dupixent MyWay application today. Placed in PAP pending info folder in pharmacy office.  Provider form placed in Dr. Lanora Manis mailbox for signature  Chesley Mires, PharmD, MPH, BCPS, CPP Clinical Pharmacist (Rheumatology and Pulmonology)

## 2022-01-16 NOTE — Telephone Encounter (Signed)
Submitted Patient Assistance Application to Dupixent MyWay for DUPIXENT along with provider portion, patient form, and  med list. No PA required as patient is uninsured. Will update patient when we receive a response.  Fax# [phone] Phone# [phone]  Chesley Mires, PharmD, MPH, BCPS, CPP Clinical Pharmacist (Rheumatology and Pulmonology)

## 2022-01-25 NOTE — Telephone Encounter (Signed)
Received a fax from  Dupixent [employer] regarding an approval for DUPIXENT patient assistance from 01/25/22 to 01/26/23.   Phone number: [phone], option 1  Patient scheduled for Dupixent new start visit on 02/01/22. Will use sample. Patient provided with Dupixent MyWay phone number and advised to pick up call if they reach out. Advised to reach out by Friday, 01/27/22, to schedule shipment to be received by 02/15/22  Chesley Mires, PharmD, MPH, BCPS, CPP Clinical Pharmacist (Rheumatology and Pulmonology)

## 2022-02-01 ENCOUNTER — Ambulatory Visit: Payer: Self-pay | Admitting: Pharmacist

## 2022-02-01 DIAGNOSIS — J449 Chronic obstructive pulmonary disease, unspecified: Secondary | ICD-10-CM

## 2022-02-01 DIAGNOSIS — Z7189 Other specified counseling: Secondary | ICD-10-CM

## 2022-02-01 MED ORDER — DUPIXENT 300 MG/2ML ~~LOC~~ SOAJ: SUBCUTANEOUS | 1 refills | Status: DC

## 2022-02-01 NOTE — Progress Notes (Signed)
HPI Patient presents today to [facility] to see pharmacy team for Dupixent new start.  Past medical history includes severe asthma, bronchiectasis, appendicitis  Number of hospitalizations in past year: 1 (acute respiratory failure) Number of COPD/asthma exacerbations in past year: 4 prednisone tapers  Respiratory Medications Current regimen: Anoro Ellipta (1 puff once daily) - approved through [employer] pt assistance Patient has  cost as an adherence challenge due to lack of insurance  OBJECTIVE No Known Allergies  Outpatient Encounter Medications as of 02/01/2022  Medication Sig   albuterol (PROVENTIL) (2.5 MG/3ML) 0.083% nebulizer solution Inhale 3 mLs into the lungs every 4 (four) hours as needed for wheezing or shortness of breath.   albuterol (VENTOLIN HFA) 108 (90 Base) MCG/ACT inhaler Inhale 2 puffs into the lungs every 6 (six) hours as needed for wheezing or shortness of breath (when travelling).   umeclidinium-vilanterol (ANORO ELLIPTA) 62.5-25 MCG/ACT AEPB Inhale 1 puff into the lungs daily.   umeclidinium-vilanterol (ANORO ELLIPTA) 62.5-25 MCG/ACT AEPB Inhale 1 puff into the lungs daily.   No facility-administered encounter medications on file as of 02/01/2022.     Immunization History  Administered Date(s) Administered   Influenza,inj,Quad PF,6+ Mos 04/11/2015, 06/05/2017, 05/16/2021     PFTs    Latest Ref Rng & Units 09/20/2021    9:56 AM  PFT Results  FVC-Pre L 2.26   FVC-Predicted Pre % 49   FVC-Post L 2.32   FVC-Predicted Post % 50   Pre FEV1/FVC % % 48   Post FEV1/FCV % % 50   FEV1-Pre L 1.09   FEV1-Predicted Pre % 29   FEV1-Post L 1.17   TLC L 5.63   TLC % Predicted % 74   RV % Predicted % 107      Eosinophils Most recent blood eosinophil count was 200 cells/microL taken on 09/20/21.   IgE: 257 on 12/02/21   Assessment   Biologics training for dupilumab (Dupixent)  Reviewed goals of therapy, side effects, dosing, storage, and  administration.  Goals of therapy: Reviewed that Dupixent is add-on medication and patient must continue maintenance inhaler regimen. Response to therapy: may take 4 months to determine efficacy. Discussed that patients generally feel improvement sooner than 4 months.  Side effects: injection site reaction (6-18%), antibody development (5-16%), ophthalmic conjunctivitis (2-16%), transient blood eosinophilia (1-2%)  Dose: 600mg  at Week 0 (administered today in clinic) followed by 300mg  every 14 days thereafter  Administration/Storage:  Reviewed administration sites of thigh or abdomen (at least 2-3 inches away from abdomen). Reviewed the upper arm is only appropriate if caregiver is administering injection  Reviewed storage of medication in refrigerator.  Access: Approval of Dupixent through: patient assistance  Patient self-administered Dupixent 300mg /77ml x 2 (total dose 600mg ) in right lower abdomen and left lower abdomen using sample Dupixent 300mg /24mL autoinjector pen x 2 pens NDC: Lot: 3m Expiration: 05/03/2023  Patient monitored for 30 minutes for adverse reaction.  Patient tolerated without issue. Injection site checked and no bump, redness, or swelling. Patient self-reports no itchiness or irritation.  Medication Reconciliation  A drug regimen assessment was performed, including review of allergies, interactions, disease-state management, dosing and immunization history. Medications were reviewed with the patient, including name, instructions, indication, goals of therapy, potential side effects, importance of adherence, and safe use.  Drug interaction(s): none  Immunizations  He is UTD on influenza Per chart review, has not received COVID19 vaccine  He has not received pneumonia vaccine - per chart review may be receiving at upcoming  PCP visit on 02/15/22  PLAN Continue Dupixent 300 mg every 14 days.  Next dose is due 02/15/22 and every 14 days  thereafter. Rx sent to: [facility]: [phone].  Patient is expected to receive first shipment of Dupixent today at home Continue maintenance asthma regimen of:  Anoro Ellipta (1 puff once daily)  All questions encouraged and answered.  Instructed patient to reach out with any further questions or concerns.  Thank you for allowing pharmacy to participate in this patient's care.  This appointment required 40 minutes of patient care (this includes precharting, chart review, review of results, face-to-face care, etc.).   Chesley Mires, PharmD, MPH, BCPS, CPP Clinical Pharmacist (Rheumatology and Pulmonology)

## 2022-02-01 NOTE — Patient Instructions (Addendum)
Your next DUPIXENT dose is due on 02/15/22, 03/01/22, and every 2 weeks thereafter  CONTINUE ANORO every day  Your prescription will be shipped from [facility]. Their phone number is [phone]  Please call them if you do not receive your shipment today  You will need to be seen by your provider in 3 to 4 months to assess how DUPIXENT is working for you. Please ensure you have a follow-up appointment scheduled in November or December 2023. Call our clinic if you need to make this appointment.  How to manage an injection site reaction: Remember the 5 C's: COUNTER - leave on the counter at least 30 minutes but up to overnight to bring medication to room temperature. This may help prevent stinging COLD - place something cold (like an ice gel pack or cold water bottle) on the injection site just before cleansing with alcohol. This may help reduce pain CLARITIN - use Claritin (generic name is loratadine) for the first two weeks of treatment or the day of, the day before, and the day after injecting. This will help to minimize injection site reactions CORTISONE CREAM - apply if injection site is irritated and itching CALL ME - if injection site reaction is bigger than the size of your fist, looks infected, blisters, or if you develop hives

## 2022-02-15 ENCOUNTER — Encounter: Payer: Self-pay | Admitting: Nurse Practitioner

## 2022-02-15 ENCOUNTER — Ambulatory Visit: Payer: MEDICAID | Attending: Nurse Practitioner | Admitting: Nurse Practitioner

## 2022-02-15 VITALS — BP 122/77 | HR 68 | Temp 97.8°F | Ht 71.0 in | Wt 157.0 lb

## 2022-02-15 DIAGNOSIS — R7303 Prediabetes: Secondary | ICD-10-CM

## 2022-02-15 DIAGNOSIS — Z23 Encounter for immunization: Secondary | ICD-10-CM

## 2022-02-15 DIAGNOSIS — J479 Bronchiectasis, uncomplicated: Secondary | ICD-10-CM

## 2022-02-15 LAB — POCT GLYCOSYLATED HEMOGLOBIN (HGB A1C): HbA1c, POC (prediabetic range): 6 % (ref 5.7–6.4)

## 2022-02-15 LAB — GLUCOSE, POCT (MANUAL RESULT ENTRY): POC Glucose: 104 mg/dl — AB (ref 70–99)

## 2022-02-15 NOTE — Progress Notes (Signed)
Assessment & Plan:  [name] was seen today for prediabetes.  Diagnoses and all orders for this visit:  Prediabetes -     POCT glycosylated hemoglobin (Hb A1C) -     POCT glucose (manual entry) -     CMP14+EGFR  Need for vaccination with 20-polyvalent pneumococcal conjugate vaccine -     Pneumococcal conjugate vaccine 20-valent  Bronchiectasis without complication (HCC) Continue daily use of inhalers and follow-up with pulmonology as instructed   Patient has been counseled on age-appropriate routine health concerns for screening and prevention. These are reviewed and up-to-date. Referrals have been placed accordingly. Immunizations are up-to-date or declined.    Subjective:   Chief Complaint  Patient presents with   Prediabetes   HPI Shane Fleming 51 y.o. male presents to office today for follow up to prediabetes  He has a past medical history of Asthma/COPD overlap Prediabetes  Prediabetes Well controlled with diet only at this time.  Lab Results  Component Value Date   HGBA1C 6.0 02/15/2022    Asthma Using Dupixent and Anoro daily as prescribed.  Denies any worsening shortness of breath cough or wheezing.  He is followed by [facility] pulmonary care for this.   Review of Systems  Constitutional:  Negative for fever, malaise/fatigue and weight loss.  HENT: Negative.  Negative for nosebleeds.   Eyes: Negative.  Negative for blurred vision, double vision and photophobia.  Respiratory: Negative.  Negative for cough and shortness of breath.   Cardiovascular: Negative.  Negative for chest pain, palpitations and leg swelling.  Gastrointestinal: Negative.  Negative for heartburn, nausea and vomiting.  Musculoskeletal: Negative.  Negative for myalgias.  Neurological: Negative.  Negative for dizziness, focal weakness, seizures and headaches.  Psychiatric/Behavioral: Negative.  Negative for suicidal ideas.     Past Medical History:  Diagnosis Date   Asthma    Prediabetes     Sinusitis 2018    Past Surgical History:  Procedure Laterality Date   APPENDECTOMY     BRONCHIAL WASHINGS  02/17/2021   Procedure: BRONCHIAL WASHINGS;  Surgeon: Martina Sinner, MD;  Location: [facility] ENDOSCOPY;  Service: Pulmonary;;   LAPAROSCOPIC APPENDECTOMY N/A 04/08/2015   Procedure: APPENDECTOMY LAPAROSCOPIC;  Surgeon: Glenna Fellows, MD;  Location: [facility] ORS;  Service: General;  Laterality: N/A;   VIDEO BRONCHOSCOPY N/A 02/17/2021   Procedure: VIDEO BRONCHOSCOPY WITHOUT FLUORO;  Surgeon: Martina Sinner, MD;  Location: [facility] ENDOSCOPY;  Service: Pulmonary;  Laterality: N/A;    Family History  Problem Relation Age of Onset   Asthma Father     Social History Reviewed with no changes to be made today.   Outpatient Medications Prior to Visit  Medication Sig Dispense Refill   albuterol (PROVENTIL) (2.5 MG/3ML) 0.083% nebulizer solution Inhale 3 mLs into the lungs every 4 (four) hours as needed for wheezing or shortness of breath. 90 mL 12   albuterol (VENTOLIN HFA) 108 (90 Base) MCG/ACT inhaler Inhale 2 puffs into the lungs every 6 (six) hours as needed for wheezing or shortness of breath (when travelling). 54 g 3   Dupilumab (DUPIXENT) 300 MG/2ML SOPN Inject 600mg  into the skin at Week 0, then 300mg  every 14 days thereafter 12 mL 1   umeclidinium-vilanterol (ANORO ELLIPTA) 62.5-25 MCG/ACT AEPB Inhale 1 puff into the lungs daily. 60 each 6   umeclidinium-vilanterol (ANORO ELLIPTA) 62.5-25 MCG/ACT AEPB Inhale 1 puff into the lungs daily. 180 each 3   No facility-administered medications prior to visit.    No Known Allergies  Objective:    BP 122/77   Pulse 68   Temp 97.8 F (36.6 C) (Temporal)   Ht $R'5\' 11"'dx$  (1.803 m)   Wt 157 lb (71.2 kg)   SpO2 97%   BMI 21.90 kg/m  Wt Readings from Last 3 Encounters:  02/15/22 157 lb (71.2 kg)  12/21/21 143 lb 3.2 oz (65 kg)  12/02/21 138 lb 9.6 oz (62.9 kg)    Physical Exam Vitals and nursing note reviewed.  Constitutional:       Appearance: He is well-developed.  HENT:     Head: Normocephalic and atraumatic.  Cardiovascular:     Rate and Rhythm: Normal rate and regular rhythm.     Heart sounds: Normal heart sounds. No murmur heard.    No friction rub. No gallop.  Pulmonary:     Effort: Pulmonary effort is normal. No tachypnea or respiratory distress.     Breath sounds: Examination of the right-lower field reveals wheezing. Examination of the left-lower field reveals wheezing. Wheezing present. No decreased breath sounds, rhonchi or rales.  Chest:     Chest wall: No tenderness.  Abdominal:     General: Bowel sounds are normal.     Palpations: Abdomen is soft.  Musculoskeletal:        General: Normal range of motion.     Cervical back: Normal range of motion.  Skin:    General: Skin is warm and dry.  Neurological:     Mental Status: He is alert and oriented to person, place, and time.     Coordination: Coordination normal.  Psychiatric:        Behavior: Behavior normal. Behavior is cooperative.        Thought Content: Thought content normal.        Judgment: Judgment normal.          Patient has been counseled extensively about nutrition and exercise as well as the importance of adherence with medications and regular follow-up. The patient was given clear instructions to go to ER or return to medical center if symptoms don't improve, worsen or new problems develop. The patient verbalized understanding.   Follow-up: Return in about 6 months (around 08/18/2022).   Gildardo Pounds, FNP-BC [facility] [place], [place]   02/15/2022, 1:25 PM

## 2022-02-16 LAB — CMP14+EGFR
ALT: 29 IU/L (ref 0–44)
AST: 33 IU/L (ref 0–40)
Albumin/Globulin Ratio: 1.2 (ref 1.2–2.2)
Albumin: 4.4 g/dL (ref 3.8–4.9)
Alkaline Phosphatase: 64 IU/L (ref 44–121)
BUN/Creatinine Ratio: 13 (ref 9–20)
BUN: 16 mg/dL (ref 6–24)
Bilirubin Total: 0.2 mg/dL (ref 0.0–1.2)
CO2: 23 mmol/L (ref 20–29)
Calcium: 10.2 mg/dL (ref 8.7–10.2)
Chloride: 104 mmol/L (ref 96–106)
Creatinine, Ser: 1.21 mg/dL (ref 0.76–1.27)
Globulin, Total: 3.7 g/dL (ref 1.5–4.5)
Glucose: 108 mg/dL — ABNORMAL HIGH (ref 70–99)
Potassium: 4.2 mmol/L (ref 3.5–5.2)
Sodium: 142 mmol/L (ref 134–144)
Total Protein: 8.1 g/dL (ref 6.0–8.5)
eGFR: 72 mL/min/{1.73_m2} (ref >59)

## 2022-04-25 ENCOUNTER — Ambulatory Visit: Payer: Self-pay | Admitting: Pulmonary Disease

## 2022-05-09 ENCOUNTER — Ambulatory Visit (INDEPENDENT_AMBULATORY_CARE_PROVIDER_SITE_OTHER): Payer: Self-pay | Admitting: Pulmonary Disease

## 2022-05-09 ENCOUNTER — Encounter: Payer: Self-pay | Admitting: Pulmonary Disease

## 2022-05-09 VITALS — BP 128/78 | HR 91 | Ht 71.0 in | Wt 154.6 lb

## 2022-05-09 DIAGNOSIS — J479 Bronchiectasis, uncomplicated: Secondary | ICD-10-CM

## 2022-05-09 DIAGNOSIS — J4489 Other specified chronic obstructive pulmonary disease: Secondary | ICD-10-CM

## 2022-05-09 MED ORDER — STIOLTO RESPIMAT 2.5-2.5 MCG/ACT IN AERS: 2.0000 | INHALATION_SPRAY | Freq: Every day | RESPIRATORY_TRACT | 0 refills | Status: DC

## 2022-05-09 NOTE — Patient Instructions (Addendum)
Use albuterol nebulizer treatment each morning followed by flutter valve to cough out mucous  Use stiolto inhaler 2 puffs daily while we try to figure out your Anoro inhaler prescription  Continue on dupixent therapy  Follow up in 4 months

## 2022-05-09 NOTE — Progress Notes (Signed)
Synopsis: Referred in September 202 for bronchiectasis  Subjective:   PATIENT ID: [name] GENDER: male DOB: [birth year], MRN: [id number]  HPI  Chief Complaint  Patient presents with   Follow-up    Currently on Dupixent. He stopped receiving Anoro from [place] after 3 months.    Sabien Michiels is a 51 year old male, never smoker with bronchiectasis who returns to pulmonary clinic for follow up for bronchiectasis and asthma.   Patient was started on dupixent 02/01/22. He reports his breathing is better. He is now going to the mosque for prayer as he was not able to do this before. He feels he could work a full 8 hour day as he feels stronger. He is coughing less, mainly in the morning times now. He is sleeping better at night as he is not coughing and wheezing as much.   He has not been able to get Anoro over the past 2 months. He has been on patient assistance for this in the past.   OV 12/21/21 He was referred to Allergy Immunology at last visit for evaluation of abnormal immunoglobulin levels with elevated IgG. He had allergy testing performed with elevated IgE levels to dust mite, dog, grass, mold, tree, weed. He had low titers to strep pneumoniae and tetanus. He was recommended to get updated vaccines and plan for repeated titers. He was recommended to resume dulera 100-39mg 2 puffs twice daily along with his anoro 1 puff daily. He was recommended to start  singulair 145mdaily and zyrtec 1067maily for allergies.   He has been spending $500 on anoro the last couple of months as he does not have insurance coverage. He has not started dulera, singulair or zyrtec due to affordability.   His breathing as been stable due to it being summer time.   OV 09/20/21 He reports persistent dyspnea and cough. He is not coughing any phlegm up He reports some improvement in his symptoms since switching from dulera to stiolto. He reports decreased appetite over the past couple of weeks. He does have  wheezing. He is having a harder time with his work due to the dyspnea.   PFTs today show mixed obstructive and restrictive defects. No significant bronchodilator response.   OV 06/20/2021 He has done well since last visit on Dulera 100-5 MCG 2 puffs twice daily along with initiation of pantoprazole 40 mg daily for GERD.  He has also been using albuterol nebulizer treatments followed by flutter valve therapy. He reports since the weather has changed he has increased cough and shortness of breath.  He reports a slight increase in sputum production since his cough increased.  OV 03/23/21 Sputum culture was positive for Klebsiella pneumonia from expectorated sputum sample.  He completed 14 days of fluoroquinolone therapy and is feeling much better.  His fungal and AFB cultures show no growth to date from a bronchoscopy with BAL on 02/17/2021.  CT chest scan from 02/11/2021 shows diffuse bronchial wall thickening and bilateral lower lobe predominant cylindrical bronchiectasis with mucoid impaction of the right lower airways.  He has multiple scattered pulmonary nodules measuring 5 to 9 mm in size.  He was discharged on home oxygen which she has stopped using over the past 1 week.  He was seen by his primary care provider 03/16/2021, note personally reviewed, and he was provided [facility]-5 MCG 2 puffs twice daily along with as needed albuterol inhaler and nebulizer solution.  He does have a nebulizer machine at home along  with flutter valve.  He is finishing a course of prednisone from his primary care.  He denies any issues with seasonal allergies.  He reports the cold air does bother his breathing in the wintertime.  He is a never smoker but reports secondhand smoke exposure from friends and relatives while growing up.  He is originally from [place].  He denies any significant exposure to charcoal or wood-burning stove's while growing up.  He denies any respiratory issues as a kid or in his younger  years.  He reports he is only had a cough over the last 4 years.  Laboratory test reviewed from his hospitalization and they are negative for ANA, rheumatoid factor and ANCA testing.  IgG level is increased at 2000 464 and IgG4 subclass elevated at 106.  IgE level 252.  Past Medical History:  Diagnosis Date   Asthma    Prediabetes    Sinusitis 2018     Family History  Problem Relation Age of Onset   Asthma Father      Social History   Socioeconomic History   Marital status: Single    Spouse name: Not on file   Number of children: Not on file   Years of education: Not on file   Highest education level: Not on file  Occupational History   Not on file  Tobacco Use   Smoking status: Never    Passive exposure: Never   Smokeless tobacco: Never  Vaping Use   Vaping Use: Never used  Substance and Sexual Activity   Alcohol use: No   Drug use: No   Sexual activity: Not Currently  Other Topics Concern   Not on file  Social History Narrative   Not on file   Social Determinants of Health   Financial Resource Strain: Not on file  Food Insecurity: Not on file  Transportation Needs: Not on file  Physical Activity: Not on file  Stress: Not on file  Social Connections: Not on file  Intimate Partner Violence: Not on file     No Known Allergies   Outpatient Medications Prior to Visit  Medication Sig Dispense Refill   albuterol (PROVENTIL) (2.5 MG/3ML) 0.083% nebulizer solution Inhale 3 mLs into the lungs every 4 (four) hours as needed for wheezing or shortness of breath. 90 mL 12   albuterol (VENTOLIN HFA) 108 (90 Base) MCG/ACT inhaler Inhale 2 puffs into the lungs every 6 (six) hours as needed for wheezing or shortness of breath (when travelling). 54 g 3   Dupilumab (DUPIXENT) 300 MG/2ML SOPN Inject 659m into the skin at Week 0, then 302mevery 14 days thereafter 12 mL 1   umeclidinium-vilanterol (ANORO ELLIPTA) 62.5-25 MCG/ACT AEPB Inhale 1 puff into the lungs daily. 60  each 6   umeclidinium-vilanterol (ANORO ELLIPTA) 62.5-25 MCG/ACT AEPB Inhale 1 puff into the lungs daily. 180 each 3   No facility-administered medications prior to visit.    Review of Systems  Constitutional:  Negative for chills, fever, malaise/fatigue and weight loss.  HENT:  Negative for congestion, sinus pain and sore throat.   Eyes: Negative.   Respiratory:  Positive for cough and wheezing. Negative for hemoptysis, sputum production and shortness of breath.   Cardiovascular:  Negative for chest pain, palpitations, orthopnea, claudication and leg swelling.  Gastrointestinal:  Negative for abdominal pain, heartburn, nausea and vomiting.  Genitourinary: Negative.   Musculoskeletal:  Negative for joint pain and myalgias.  Skin:  Negative for rash.  Neurological:  Negative for weakness.  Endo/Heme/Allergies:  Negative.   Psychiatric/Behavioral: Negative.      Objective:   Vitals:   05/09/22 1054  BP: 128/78  Pulse: 91  SpO2: 95%  Weight: 154 lb 9.6 oz (70.1 kg)  Height: _0  (1.803 m)   Physical Exam Constitutional:      General: He is not in acute distress. HENT:     Head: Normocephalic and atraumatic.  Eyes:     Conjunctiva/sclera: Conjunctivae normal.  Cardiovascular:     Rate and Rhythm: Normal rate and regular rhythm.     Pulses: Normal pulses.     Heart sounds: Normal heart sounds. No murmur heard. Pulmonary:     Effort: Pulmonary effort is normal.     Breath sounds: No decreased air movement. Examination of the left-upper field reveals wheezing. Wheezing present.  Musculoskeletal:     Right lower leg: No edema.     Left lower leg: No edema.  Skin:    General: Skin is warm and dry.  Neurological:     General: No focal deficit present.     Mental Status: He is alert.  Psychiatric:        Mood and Affect: Mood normal.        Behavior: Behavior normal.        Thought Content: Thought content normal.        Judgment: Judgment normal.    CBC     Component Value Date/Time   WBC 5.1 12/02/2021 1021   WBC 6.9 09/20/2021 1128   RBC 5.27 12/02/2021 1021   RBC 4.92 09/20/2021 1128   HGB 15.2 12/02/2021 1021   HCT 44.9 12/02/2021 1021   PLT 333 12/02/2021 1021   MCV 85 12/02/2021 1021   MCH 28.8 12/02/2021 1021   MCH 28.6 02/18/2021 0101   MCHC 33.9 12/02/2021 1021   MCHC 32.6 09/20/2021 1128   RDW 13.3 12/02/2021 1021   LYMPHSABS 1.6 12/02/2021 1021   MONOABS 0.6 09/20/2021 1128   EOSABS 0.2 12/02/2021 1021   BASOSABS 0.0 12/02/2021 1021      Latest Ref Rng & Units 02/15/2022   11:49 AM 08/17/2021   11:43 AM 03/16/2021    3:31 PM  BMP  Glucose 70 - 99 mg/dL 108  112  143   BUN 6 - 24 mg/dL _1 Creatinine 0.76 - 1.27 mg/dL 1.21  1.25  1.19   BUN/Creat Ratio 9 - _2 Sodium 134 - 144 mmol/L 142  145  146   Potassium 3.5 - 5.2 mmol/L 4.2  4.0  5.0   Chloride 96 - 106 mmol/L 104  103  102   CO2 20 - 29 mmol/L _3 Calcium 8.7 - 10.2 mg/dL 10.2  9.6  9.8    Chest imaging: CT Chest 06/15/21 1. Diffuse bilateral centrilobular nodules, some with tree-in-bud configuration are again seen with mild lower lung predominant cylindrical bronchiectasis, overall improved, however, there are some new areas of nodularity. Findings are likely due to chronic atypical infection, diffuse panbronchiolitis is an additional consideration given history of sinusitis. 2. Interval resolution of bilateral lower lobe mucoid impaction. 3. Largest new solid nodule measures 7 mm in mean diameter. Non-contrast chest CT at 6-12 months is recommended. If the nodule is stable at time of repeat CT, then future CT at 18-24 months (from today's scan) is considered optional for low-risk patients, but is recommended for high-risk patients. This recommendation follows  the consensus statement: Guidelines for Management of Incidental Pulmonary Nodules Detected on CT Images: From the Fleischner Society 2017; Radiology 2017;  284:228-243. 4. Mildly patulous esophagus, finding can be seen in the setting of esophageal dysmotility.  CT chest 02/11/2021  diffuse bronchial wall thickening and bilateral lower lobe predominant cylindrical bronchiectasis with mucoid impaction of the right lower airways.  He has multiple scattered pulmonary nodules measuring 5 to 9 mm in size.  PFT:    Latest Ref Rng & Units 09/20/2021    9:56 AM  PFT Results  FVC-Pre L 2.26   FVC-Predicted Pre % 49   FVC-Post L 2.32   FVC-Predicted Post % 50   Pre FEV1/FVC % % 48   Post FEV1/FCV % % 50   FEV1-Pre L 1.09   FEV1-Predicted Pre % 29   FEV1-Post L 1.17   TLC L 5.63   TLC % Predicted % 74   RV % Predicted % 107     Echo 02/16/21: LV EF 60-65%. RV normal function and size.   Labs: Valma Cava 02/14/21 negative  Assessment & Plan:   No diagnosis found.  Discussion: Taevon Aschoff is a 51 year old male, never smoker with bronchiectasis who returns to pulmonary clinic for follow up for bronchiectasis and asthma-COPD overlap syndrome.   His breathing is much improved since startin dupixent. He is to continue this.  We will provide him with sample of stiolto until we can sort out his patient assistance for Anoro.   He is to use albuterol nebulizer treatment daily each morning followed by flutter valve therapy for pulmonary hygiene.   Follow up in 4 months.   Freda Jackson, MD  Pulmonary & Critical Care Office: [phone]    Current Outpatient Medications:    albuterol (PROVENTIL) (2.5 MG/3ML) 0.083% nebulizer solution, Inhale 3 mLs into the lungs every 4 (four) hours as needed for wheezing or shortness of breath., Disp: 90 mL, Rfl: 12   albuterol (VENTOLIN HFA) 108 (90 Base) MCG/ACT inhaler, Inhale 2 puffs into the lungs every 6 (six) hours as needed for wheezing or shortness of breath (when travelling)., Disp: 54 g, Rfl: 3   Dupilumab (DUPIXENT) 300 MG/2ML SOPN, Inject 643m into the skin at Week 0, then  3078mevery 14 days thereafter, Disp: 12 mL, Rfl: 1   umeclidinium-vilanterol (ANORO ELLIPTA) 62.5-25 MCG/ACT AEPB, Inhale 1 puff into the lungs daily., Disp: 60 each, Rfl: 6   umeclidinium-vilanterol (ANORO ELLIPTA) 62.5-25 MCG/ACT AEPB, Inhale 1 puff into the lungs daily., Disp: 180 each, Rfl: 3

## 2022-05-09 NOTE — Addendum Note (Signed)
Addended by: Valerie Salts on: 05/09/2022 11:26 AM   Modules accepted: Orders

## 2022-07-21 ENCOUNTER — Telehealth: Payer: Self-pay | Admitting: Pulmonary Disease

## 2022-07-21 ENCOUNTER — Telehealth: Payer: Self-pay | Admitting: Pharmacist

## 2022-07-21 DIAGNOSIS — J4489 Other specified chronic obstructive pulmonary disease: Secondary | ICD-10-CM

## 2022-07-21 MED ORDER — DUPIXENT 300 MG/2ML ~~LOC~~ SOAJ: 300.0000 mg | SUBCUTANEOUS | 1 refills | Status: DC

## 2022-07-21 NOTE — Telephone Encounter (Signed)
Due to schedules not being out that far yet, patient does have a recall in for March 2024

## 2022-07-21 NOTE — Telephone Encounter (Signed)
Refill sent for [place] to [facility]: [phone]  Dose: 300 mg SQ every 2 weeks  Last OV: 05/09/2022 Provider: Dr. Erin Fulling  Next OV: not yet scheduled (due in March 2024)  Knox Saliva, PharmD, MPH, BCPS Clinical Pharmacist (Rheumatology and Pulmonology)

## 2022-07-21 NOTE — Telephone Encounter (Signed)
Called to request a refill for the Dupilumab ([place]) 300 MG/2ML SOPN , Please advise.

## 2022-07-21 NOTE — Telephone Encounter (Signed)
This has alreay been sent earlier this morning to [facility]. Closing encounter  Knox Saliva, PharmD, MPH, BCPS, CPP Clinical Pharmacist (Rheumatology and Pulmonology)

## 2022-08-18 ENCOUNTER — Other Ambulatory Visit: Payer: Self-pay

## 2022-08-18 ENCOUNTER — Encounter: Payer: Self-pay | Admitting: Nurse Practitioner

## 2022-08-18 ENCOUNTER — Ambulatory Visit: Payer: Self-pay | Attending: Nurse Practitioner | Admitting: Nurse Practitioner

## 2022-08-18 VITALS — BP 125/75 | HR 99 | Ht 71.0 in | Wt 152.2 lb

## 2022-08-18 DIAGNOSIS — J9601 Acute respiratory failure with hypoxia: Secondary | ICD-10-CM

## 2022-08-18 DIAGNOSIS — R911 Solitary pulmonary nodule: Secondary | ICD-10-CM

## 2022-08-18 DIAGNOSIS — R7989 Other specified abnormal findings of blood chemistry: Secondary | ICD-10-CM

## 2022-08-18 DIAGNOSIS — J479 Bronchiectasis, uncomplicated: Secondary | ICD-10-CM

## 2022-08-18 DIAGNOSIS — R7303 Prediabetes: Secondary | ICD-10-CM

## 2022-08-18 DIAGNOSIS — Z23 Encounter for immunization: Secondary | ICD-10-CM

## 2022-08-18 LAB — POCT GLYCOSYLATED HEMOGLOBIN (HGB A1C): HbA1c, POC (prediabetic range): 6.2 % (ref 5.7–6.4)

## 2022-08-18 LAB — GLUCOSE, POCT (MANUAL RESULT ENTRY): POC Glucose: 112 mg/dl — AB (ref 70–99)

## 2022-08-18 MED ORDER — ALBUTEROL SULFATE HFA 108 (90 BASE) MCG/ACT IN AERS
2.0000 | INHALATION_SPRAY | Freq: Four times a day (QID) | RESPIRATORY_TRACT | 3 refills | Status: DC | PRN
  Filled 2022-08-18: qty 18, 25d supply, fill #0

## 2022-08-18 MED ORDER — ANORO ELLIPTA 62.5-25 MCG/ACT IN AEPB
1.0000 | INHALATION_SPRAY | Freq: Every day | RESPIRATORY_TRACT | 3 refills | Status: DC
  Filled 2022-08-18: qty 180, 90d supply, fill #0

## 2022-08-18 MED ORDER — ALBUTEROL SULFATE (2.5 MG/3ML) 0.083% IN NEBU
3.0000 mL | INHALATION_SOLUTION | RESPIRATORY_TRACT | 12 refills | Status: DC | PRN
  Filled 2022-08-18: qty 90, 5d supply, fill #0

## 2022-08-18 NOTE — Progress Notes (Unsigned)
Assessment & Plan:  Arth was seen today for prediabetes.  Diagnoses and all orders for this visit:  Prediabetes -     POCT glycosylated hemoglobin (Hb A1C) -     POCT glucose (manual entry) -     CMP14+EGFR  Need for Tdap vaccination -     Tdap vaccine greater than or equal to 54yo IM  Lung nodule -     CT Chest Wo Contrast; Future  Bronchiectasis without complication (HCC) -     albuterol (PROVENTIL) (2.5 MG/3ML) 0.083% nebulizer solution; Inhale 3 mLs into the lungs every 4 (four) hours as needed for wheezing or shortness of breath. -     albuterol (VENTOLIN HFA) 108 (90 Base) MCG/ACT inhaler; Inhale 2 puffs into the lungs every 6 (six) hours as needed for wheezing or shortness of breath (when travelling). -     umeclidinium-vilanterol (ANORO ELLIPTA) 62.5-25 MCG/ACT AEPB; Inhale 1 puff into the lungs daily. NEEDS PASS  Abnormal CBC -     CBC with Differential  Need for immunization against influenza -     Flu Vaccine QUAD 37moIM (Fluarix, Fluzone & Alfiuria Quad PF)    Patient has been counseled on age-appropriate routine health concerns for screening and prevention. These are reviewed and up-to-date. Referrals have been placed accordingly. Immunizations are up-to-date or declined.    Subjective:   Chief Complaint  Patient presents with   Prediabetes   HPI Shane Fleming 52y.o. male presents to office today for follow up to prediabetes.   He is being followed by pulmonology for his asthma. Symptoms are well controlled with his current inhalers.   Due for repeat CT Chest to follow up lung nodule from imaging obtained on 06-16-2021   Prediabetes A1c at goal of <6.5.  Lab Results  Component Value Date   HGBA1C 6.2 08/18/2022    Blood pressure is normal today. He is not taking any antihypertensives at this time. BP Readings from Last 3 Encounters:  08/18/22 125/75  05/09/22 128/78  02/15/22 122/77    Review of Systems  Constitutional:  Negative for  fever, malaise/fatigue and weight loss.  HENT: Negative.  Negative for nosebleeds.   Eyes: Negative.  Negative for blurred vision, double vision and photophobia.  Respiratory: Negative.  Negative for cough and shortness of breath.   Cardiovascular: Negative.  Negative for chest pain, palpitations and leg swelling.  Gastrointestinal: Negative.  Negative for heartburn, nausea and vomiting.  Musculoskeletal: Negative.  Negative for myalgias.  Neurological: Negative.  Negative for dizziness, focal weakness, seizures and headaches.  Psychiatric/Behavioral: Negative.  Negative for suicidal ideas.     Past Medical History:  Diagnosis Date   Asthma    Prediabetes    Sinusitis 2018    Past Surgical History:  Procedure Laterality Date   APPENDECTOMY     BRONCHIAL WASHINGS  02/17/2021   Procedure: BRONCHIAL WASHINGS;  Surgeon: DFreddi Starr MD;  [facility];  Service: Pulmonary;;   LAPAROSCOPIC APPENDECTOMY N/A 04/08/2015   Procedure: APPENDECTOMY LAPAROSCOPIC;  Surgeon: BExcell Seltzer MD;  Location[facility];  Service: General;  Laterality: N/A;   VIDEO BRONCHOSCOPY N/A 02/17/2021   Procedure: VIDEO BRONCHOSCOPY WITHOUT FLUORO;  Surgeon: DFreddi Starr MD;  [facility];  Service: Pulmonary;  Laterality: N/A;    Family History  Problem Relation Age of Onset   Asthma Father     Social History Reviewed with no changes to be made today.   Outpatient Medications Prior to Visit  Medication Sig Dispense Refill   Dupilumab (DUPIXENT) 300 MG/2ML SOPN Inject 300 mg into the skin every 14 (fourteen) days. 12 mL 1   Tiotropium Bromide-Olodaterol (STIOLTO RESPIMAT) 2.5-2.5 MCG/ACT AERS Inhale 2 puffs into the lungs daily. 8 g 0   umeclidinium-vilanterol (ANORO ELLIPTA) 62.5-25 MCG/ACT AEPB Inhale 1 puff into the lungs daily. 60 each 6   albuterol (PROVENTIL) (2.5 MG/3ML) 0.083% nebulizer solution Inhale 3 mLs into the lungs every 4 (four) hours as needed for wheezing  or shortness of breath. 90 mL 12   albuterol (VENTOLIN HFA) 108 (90 Base) MCG/ACT inhaler Inhale 2 puffs into the lungs every 6 (six) hours as needed for wheezing or shortness of breath (when travelling). 54 g 3   umeclidinium-vilanterol (ANORO ELLIPTA) 62.5-25 MCG/ACT AEPB Inhale 1 puff into the lungs daily. 180 each 3   No facility-administered medications prior to visit.    No Known Allergies     Objective:    BP 125/75   Pulse 99   Ht 5' 11"$  (1.803 m)   Wt 152 lb 3.2 oz (69 kg)   SpO2 100%   BMI 21.23 kg/m  Wt Readings from Last 3 Encounters:  08/18/22 152 lb 3.2 oz (69 kg)  05/09/22 154 lb 9.6 oz (70.1 kg)  02/15/22 157 lb (71.2 kg)    Physical Exam Vitals and nursing note reviewed.  Constitutional:      Appearance: He is well-developed.  HENT:     Head: Normocephalic and atraumatic.  Cardiovascular:     Rate and Rhythm: Normal rate and regular rhythm.     Heart sounds: Normal heart sounds. No murmur heard.    No friction rub. No gallop.  Pulmonary:     Effort: Pulmonary effort is normal. No tachypnea or respiratory distress.     Breath sounds: Normal breath sounds. No decreased breath sounds, wheezing, rhonchi or rales.  Chest:     Chest wall: No tenderness.  Abdominal:     General: Bowel sounds are normal.     Palpations: Abdomen is soft.  Musculoskeletal:        General: Normal range of motion.     Cervical back: Normal range of motion.  Skin:    General: Skin is warm and dry.  Neurological:     Mental Status: He is alert and oriented to person, place, and time.     Coordination: Coordination normal.  Psychiatric:        Behavior: Behavior normal. Behavior is cooperative.        Thought Content: Thought content normal.        Judgment: Judgment normal.          Patient has been counseled extensively about nutrition and exercise as well as the importance of adherence with medications and regular follow-up. The patient was given clear instructions to  go to ER or return to medical center if symptoms don't improve, worsen or new problems develop. The patient verbalized understanding.   Follow-up: Return in about 6 months (around 02/16/2023) for needs financial assistance paperwork.   Gildardo Pounds, FNP-BC [facility] [place], [place]   08/22/2022, 7:25 PM

## 2022-08-19 LAB — CBC WITH DIFFERENTIAL/PLATELET
Basophils Absolute: 0 10*3/uL (ref 0.0–0.2)
Basos: 1 %
EOS (ABSOLUTE): 0.6 10*3/uL — ABNORMAL HIGH (ref 0.0–0.4)
Eos: 10 %
Hematocrit: 43.7 % (ref 37.5–51.0)
Hemoglobin: 14.7 g/dL (ref 13.0–17.7)
Immature Grans (Abs): 0 10*3/uL (ref 0.0–0.1)
Immature Granulocytes: 0 %
Lymphocytes Absolute: 2.6 10*3/uL (ref 0.7–3.1)
Lymphs: 40 %
MCH: 28.9 pg (ref 26.6–33.0)
MCHC: 33.6 g/dL (ref 31.5–35.7)
MCV: 86 fL (ref 79–97)
Monocytes Absolute: 0.6 10*3/uL (ref 0.1–0.9)
Monocytes: 9 %
Neutrophils Absolute: 2.5 10*3/uL (ref 1.4–7.0)
Neutrophils: 40 %
Platelets: 304 10*3/uL (ref 150–450)
RBC: 5.08 x10E6/uL (ref 4.14–5.80)
RDW: 13.4 % (ref 11.6–15.4)
WBC: 6.3 10*3/uL (ref 3.4–10.8)

## 2022-08-19 LAB — CMP14+EGFR
ALT: 30 IU/L (ref 0–44)
AST: 32 IU/L (ref 0–40)
Albumin/Globulin Ratio: 1.2 (ref 1.2–2.2)
Albumin: 4.3 g/dL (ref 3.8–4.9)
Alkaline Phosphatase: 76 IU/L (ref 44–121)
BUN/Creatinine Ratio: 15 (ref 9–20)
BUN: 16 mg/dL (ref 6–24)
Bilirubin Total: <0.2 mg/dL (ref 0.0–1.2)
CO2: 26 mmol/L (ref 20–29)
Calcium: 9.6 mg/dL (ref 8.7–10.2)
Chloride: 100 mmol/L (ref 96–106)
Creatinine, Ser: 1.09 mg/dL (ref 0.76–1.27)
Globulin, Total: 3.6 g/dL (ref 1.5–4.5)
Glucose: 105 mg/dL — ABNORMAL HIGH (ref 70–99)
Potassium: 4.8 mmol/L (ref 3.5–5.2)
Sodium: 140 mmol/L (ref 134–144)
Total Protein: 7.9 g/dL (ref 6.0–8.5)
eGFR: 81 mL/min/{1.73_m2} (ref >59)

## 2022-08-22 ENCOUNTER — Encounter: Payer: Self-pay | Admitting: Nurse Practitioner

## 2022-09-06 ENCOUNTER — Ambulatory Visit (HOSPITAL_COMMUNITY): Admission: RE | Admit: 2022-09-06 | Payer: Self-pay | Source: Ambulatory Visit

## 2023-01-15 ENCOUNTER — Telehealth: Payer: Self-pay

## 2023-01-15 NOTE — Telephone Encounter (Signed)
Received fax from [employer] stating that pt's PAP enrollment will expire on 01/25/2023. Will work on Advice worker for [employer].

## 2023-01-29 ENCOUNTER — Other Ambulatory Visit: Payer: Self-pay

## 2023-02-02 ENCOUNTER — Other Ambulatory Visit: Payer: Self-pay

## 2023-02-05 ENCOUNTER — Other Ambulatory Visit: Payer: Self-pay

## 2023-02-15 NOTE — Telephone Encounter (Signed)
[employer] PAP renewal application printed. Patient significantly overdue for f/u appt with Dr. Francine Graven.  Provider form placed in Dr. Lanora Manis mailbox for signature. Patient portion will need to be completed at [facility]  PAP submission pending [facility]  Chesley Mires, PharmD, MPH, BCPS, CPP Clinical Pharmacist (Rheumatology and Pulmonology)

## 2023-02-16 ENCOUNTER — Other Ambulatory Visit (HOSPITAL_COMMUNITY): Payer: Self-pay

## 2023-02-16 ENCOUNTER — Ambulatory Visit: Payer: Self-pay | Attending: Nurse Practitioner | Admitting: Nurse Practitioner

## 2023-02-16 ENCOUNTER — Encounter: Payer: Self-pay | Admitting: Nurse Practitioner

## 2023-02-16 ENCOUNTER — Other Ambulatory Visit: Payer: Self-pay

## 2023-02-16 VITALS — BP 127/79 | HR 87 | Ht 71.0 in | Wt 144.4 lb

## 2023-02-16 DIAGNOSIS — R7303 Prediabetes: Secondary | ICD-10-CM

## 2023-02-16 DIAGNOSIS — J470 Bronchiectasis with acute lower respiratory infection: Secondary | ICD-10-CM

## 2023-02-16 DIAGNOSIS — Z1211 Encounter for screening for malignant neoplasm of colon: Secondary | ICD-10-CM

## 2023-02-16 DIAGNOSIS — R7989 Other specified abnormal findings of blood chemistry: Secondary | ICD-10-CM

## 2023-02-16 MED ORDER — ALBUTEROL SULFATE (2.5 MG/3ML) 0.083% IN NEBU
3.0000 mL | INHALATION_SOLUTION | RESPIRATORY_TRACT | 12 refills | Status: DC | PRN
Start: 2023-02-16 — End: 2023-08-07
  Filled 2023-02-16: qty 90, 7d supply, fill #0
  Filled 2023-03-19: qty 90, 7d supply, fill #1
  Filled 2023-04-19: qty 90, 7d supply, fill #2
  Filled 2023-06-13: qty 90, 7d supply, fill #3
  Filled 2023-07-23 (×2): qty 90, 7d supply, fill #4

## 2023-02-16 MED ORDER — ANORO ELLIPTA 62.5-25 MCG/ACT IN AEPB
1.0000 | INHALATION_SPRAY | Freq: Every day | RESPIRATORY_TRACT | 3 refills | Status: DC
Start: 2023-02-16 — End: 2023-08-07
  Filled 2023-02-16: qty 180, 180d supply, fill #0

## 2023-02-16 MED ORDER — ALBUTEROL SULFATE HFA 108 (90 BASE) MCG/ACT IN AERS
2.0000 | INHALATION_SPRAY | Freq: Four times a day (QID) | RESPIRATORY_TRACT | 3 refills | Status: DC | PRN
Start: 2023-02-16 — End: 2023-08-07
  Filled 2023-02-16: qty 18, 25d supply, fill #0
  Filled 2023-03-19: qty 18, 25d supply, fill #1
  Filled 2023-04-19: qty 18, 25d supply, fill #2
  Filled 2023-06-13: qty 18, 25d supply, fill #3
  Filled 2023-07-23: qty 18, 25d supply, fill #4

## 2023-02-16 MED ORDER — MONTELUKAST SODIUM 10 MG PO TABS
10.0000 mg | ORAL_TABLET | Freq: Every day | ORAL | 3 refills | Status: DC
Start: 2023-02-16 — End: 2023-08-07
  Filled 2023-02-16: qty 90, 90d supply, fill #0

## 2023-02-16 MED ORDER — PREDNISONE 20 MG PO TABS
40.0000 mg | ORAL_TABLET | Freq: Every day | ORAL | 0 refills | Status: DC
Start: 2023-02-16 — End: 2023-03-19
  Filled 2023-02-16: qty 10, 5d supply, fill #0

## 2023-02-16 MED ORDER — ANORO ELLIPTA 62.5-25 MCG/ACT IN AEPB
1.0000 | INHALATION_SPRAY | Freq: Every day | RESPIRATORY_TRACT | 3 refills | Status: DC
Start: 2023-02-16 — End: 2023-02-16
  Filled 2023-02-16: qty 180, 180d supply, fill #0

## 2023-02-16 NOTE — Progress Notes (Signed)
Assessment & Plan:  Toussaint was seen today for medical management of chronic issues.  Diagnoses and all orders for this visit:  Bronchiectasis with acute lower respiratory infection (HCC) -     Discontinue: umeclidinium-vilanterol (ANORO ELLIPTA) 62.5-25 MCG/ACT AEPB; Inhale 1 puff into the lungs daily. NEEDS PASS -     albuterol (VENTOLIN HFA) 108 (90 Base) MCG/ACT inhaler; Inhale 2 puffs into the lungs every 6 (six) hours as needed for wheezing or shortness of breath (when travelling). -     montelukast (SINGULAIR) 10 MG tablet; Take 1 tablet (10 mg total) by mouth at bedtime. -     albuterol (PROVENTIL) (2.5 MG/3ML) 0.083% nebulizer solution; Inhale 3 mLs into the lungs every 4 (four) hours as needed for wheezing or shortness of breath. -     predniSONE (DELTASONE) 20 MG tablet; Take 2 tablets (40 mg total) by mouth daily with breakfast for 5 days. FOR ASTHMA-COPD -     umeclidinium-vilanterol (ANORO ELLIPTA) 62.5-25 MCG/ACT AEPB; Inhale 1 puff into the lungs daily. NEEDS PASS  Prediabetes -     CMP14+EGFR -     Hemoglobin A1c  Colon cancer screening -     Fecal occult blood, imunochemical([facility])  Abnormal CBC -     CBC with Differential/Platelet    Patient has been counseled on age-appropriate routine health concerns for screening and prevention. These are reviewed and up-to-date. Referrals have been placed accordingly. Immunizations are up-to-date or declined.    Subjective:   Chief Complaint  Patient presents with   Medical Management of Chronic Issues   HPI Shane Fleming 52 y.o. male presents to office today for follow up to Asthma-COPD.   CT lung was ordered today and scheduled for f/u pulm nodule.   He is overdue for follow up with Pulmonology. Ran out of the sample of stiolto but states the anoro worked better for him. We will try to get anoro filled for him on pass. He was made aware that pulmonology has been trying to contact him to schedule an appt.  He is almost out of dupixent and will need to see pulmonology for this as well.   Asthma Follow-up: He has previously been evaluated here for asthma and presents for an asthma follow-up. Symptoms currently include chest tightness, dyspnea, non-productive cough, and wheezing and occur daily. Observed precipitants include cold air, dust, fumes, occupational exposure, pollens, smoke, strong odors, and heat .  Current limitations in activity from asthma:  exercise/exertion .  Frequency of use of quick-relief meds: daily.    Prediabetes Well controlled and A1c at goal.  Lab Results  Component Value Date   HGBA1C 6.2 08/18/2022     Review of Systems  Constitutional:  Negative for fever, malaise/fatigue and weight loss.  HENT: Negative.  Negative for nosebleeds.   Eyes: Negative.  Negative for blurred vision, double vision and photophobia.  Respiratory:  Positive for shortness of breath and wheezing. Negative for cough.   Cardiovascular:  Negative for chest pain, palpitations and leg swelling.       Chest tightness  Gastrointestinal: Negative.  Negative for heartburn, nausea and vomiting.  Musculoskeletal: Negative.  Negative for myalgias.  Neurological: Negative.  Negative for dizziness, focal weakness, seizures and headaches.  Psychiatric/Behavioral: Negative.  Negative for suicidal ideas.     Past Medical History:  Diagnosis Date   Asthma    Prediabetes    Sinusitis 2018    Past Surgical History:  Procedure Laterality Date   APPENDECTOMY  BRONCHIAL WASHINGS  02/17/2021   Procedure: BRONCHIAL WASHINGS;  Surgeon: Martina Sinner, MD;  Location: [facility];  Service: Pulmonary;;   LAPAROSCOPIC APPENDECTOMY N/A 04/08/2015   Procedure: APPENDECTOMY LAPAROSCOPIC;  Surgeon: Glenna Fellows, MD;  Location[facility];  Service: General;  Laterality: N/A;   VIDEO BRONCHOSCOPY N/A 02/17/2021   Procedure: VIDEO BRONCHOSCOPY WITHOUT FLUORO;  Surgeon: Martina Sinner, MD;  Location[facility]  END[facility]COPY;  Service: Pulmonary;  Laterality: N/A;    Family History  Problem Relation Age of Onset   Asthma Father     Social History Reviewed with no changes to be made today.   Outpatient Medications Prior to Visit  Medication Sig Dispense Refill   Dupilumab (DUPIXENT) 300 MG/2ML SOPN Inject 300 mg into the skin every 14 (fourteen) days. 12 mL 1   Tiotropium Bromide-Olodaterol (STIOLTO RESPIMAT) 2.5-2.5 MCG/ACT AERS Inhale 2 puffs into the lungs daily. 8 g 0   albuterol (VENTOLIN HFA) 108 (90 Base) MCG/ACT inhaler Inhale 2 puffs into the lungs every 6 (six) hours as needed for wheezing or shortness of breath (when travelling). 54 g 3   umeclidinium-vilanterol (ANORO ELLIPTA) 62.5-25 MCG/ACT AEPB Inhale 1 puff into the lungs daily. NEEDS PASS 180 each 3   albuterol (PROVENTIL) (2.5 MG/3ML) 0.083% nebulizer solution Inhale 3 mLs into the lungs every 4 (four) hours as needed for wheezing or shortness of breath. (Patient not taking: Reported on 02/16/2023) 90 mL 12   umeclidinium-vilanterol (ANORO ELLIPTA) 62.5-25 MCG/ACT AEPB Inhale 1 puff into the lungs daily. 60 each 6   No facility-administered medications prior to visit.    No Known Allergies     Objective:    BP 127/79 (BP Location: Left Arm, Patient Position: Sitting, Cuff Size: Normal)   Pulse 87   Ht 5\' 11"  (1.803 m)   Wt 144 lb 6.4 oz (65.5 kg)   SpO2 96%   BMI 20.14 kg/m  Wt Readings from Last 3 Encounters:  02/16/23 144 lb 6.4 oz (65.5 kg)  08/18/22 152 lb 3.2 oz (69 kg)  05/09/22 154 lb 9.6 oz (70.1 kg)    Physical Exam Vitals and nursing note reviewed.  Constitutional:      Appearance: He is well-developed.  HENT:     Head: Normocephalic and atraumatic.  Cardiovascular:     Rate and Rhythm: Normal rate and regular rhythm.     Heart sounds: Normal heart sounds. No murmur heard.    No friction rub. No gallop.  Pulmonary:     Effort: Pulmonary effort is normal. No tachypnea or respiratory distress.      Breath sounds: Decreased air movement present. Wheezing present. No decreased breath sounds, rhonchi or rales.  Chest:     Chest wall: No tenderness.  Abdominal:     General: Bowel sounds are normal.     Palpations: Abdomen is soft.  Musculoskeletal:        General: Normal range of motion.     Cervical back: Normal range of motion.  Skin:    General: Skin is warm and dry.  Neurological:     Mental Status: He is alert and oriented to person, place, and time.     Coordination: Coordination normal.  Psychiatric:        Behavior: Behavior normal. Behavior is cooperative.        Thought Content: Thought content normal.        Judgment: Judgment normal.          Patient has been counseled extensively  about nutrition and exercise as well as the importance of adherence with medications and regular follow-up. The patient was given clear instructions to go to ER or return to medical center if symptoms don't improve, worsen or new problems develop. The patient verbalized understanding.   Follow-up: Return in about 6 months (around 08/19/2023).   Claiborne Rigg, FNP-BC [facility] [place], [place] [phone]   02/16/2023, 2:03 PM

## 2023-02-17 LAB — HEMOGLOBIN A1C
Est. average glucose Bld gHb Est-mCnc: 128 mg/dL
Hgb A1c MFr Bld: 6.1 % — ABNORMAL HIGH (ref 4.8–5.6)

## 2023-02-17 LAB — CMP14+EGFR
ALT: 18 IU/L (ref 0–44)
AST: 24 IU/L (ref 0–40)
Albumin: 4.5 g/dL (ref 3.8–4.9)
Alkaline Phosphatase: 79 IU/L (ref 44–121)
BUN/Creatinine Ratio: 19 (ref 9–20)
BUN: 24 mg/dL (ref 6–24)
Bilirubin Total: 0.4 mg/dL (ref 0.0–1.2)
CO2: 25 mmol/L (ref 20–29)
Calcium: 10.2 mg/dL (ref 8.7–10.2)
Chloride: 101 mmol/L (ref 96–106)
Creatinine, Ser: 1.29 mg/dL — ABNORMAL HIGH (ref 0.76–1.27)
Globulin, Total: 3.7 g/dL (ref 1.5–4.5)
Glucose: 117 mg/dL — ABNORMAL HIGH (ref 70–99)
Potassium: 4.1 mmol/L (ref 3.5–5.2)
Sodium: 141 mmol/L (ref 134–144)
Total Protein: 8.2 g/dL (ref 6.0–8.5)
eGFR: 66 mL/min/{1.73_m2} (ref >59)

## 2023-02-17 LAB — CBC WITH DIFFERENTIAL/PLATELET
Basophils Absolute: 0 10*3/uL (ref 0.0–0.2)
Basos: 1 %
EOS (ABSOLUTE): 0.2 10*3/uL (ref 0.0–0.4)
Eos: 4 %
Hematocrit: 45.8 % (ref 37.5–51.0)
Hemoglobin: 14.8 g/dL (ref 13.0–17.7)
Immature Grans (Abs): 0 10*3/uL (ref 0.0–0.1)
Immature Granulocytes: 0 %
Lymphocytes Absolute: 2.2 10*3/uL (ref 0.7–3.1)
Lymphs: 46 %
MCH: 28 pg (ref 26.6–33.0)
MCHC: 32.3 g/dL (ref 31.5–35.7)
MCV: 87 fL (ref 79–97)
Monocytes Absolute: 0.4 10*3/uL (ref 0.1–0.9)
Monocytes: 9 %
Neutrophils Absolute: 1.9 10*3/uL (ref 1.4–7.0)
Neutrophils: 40 %
Platelets: 301 10*3/uL (ref 150–450)
RBC: 5.28 x10E6/uL (ref 4.14–5.80)
RDW: 14.5 % (ref 11.6–15.4)
WBC: 4.7 10*3/uL (ref 3.4–10.8)

## 2023-02-19 ENCOUNTER — Other Ambulatory Visit: Payer: Self-pay

## 2023-02-19 ENCOUNTER — Ambulatory Visit (HOSPITAL_COMMUNITY)
Admission: RE | Admit: 2023-02-19 | Discharge: 2023-02-19 | Disposition: A | Discharge status: Home / Self Care | Payer: Self-pay | Source: Ambulatory Visit | Attending: Nurse Practitioner | Admitting: Nurse Practitioner

## 2023-02-19 DIAGNOSIS — R911 Solitary pulmonary nodule: Secondary | ICD-10-CM | POA: Insufficient documentation

## 2023-02-21 ENCOUNTER — Other Ambulatory Visit: Payer: Self-pay | Admitting: Nurse Practitioner

## 2023-02-21 DIAGNOSIS — Z1211 Encounter for screening for malignant neoplasm of colon: Secondary | ICD-10-CM

## 2023-02-21 DIAGNOSIS — R195 Other fecal abnormalities: Secondary | ICD-10-CM

## 2023-02-21 LAB — FECAL OCCULT BLOOD, IMMUNOCHEMICAL: Fecal Occult Bld: POSITIVE — AB

## 2023-03-19 ENCOUNTER — Other Ambulatory Visit: Payer: Self-pay | Admitting: Nurse Practitioner

## 2023-03-19 ENCOUNTER — Other Ambulatory Visit: Payer: Self-pay

## 2023-03-19 DIAGNOSIS — J470 Bronchiectasis with acute lower respiratory infection: Secondary | ICD-10-CM

## 2023-03-20 NOTE — Telephone Encounter (Signed)
Requested medication (s) are due for refill today: Yes  Requested medication (s) are on the active medication list: No  Last refill:  02/16/23  Future visit scheduled: Yes  Notes to clinic:  Unable to refill per protocol, cannot delegate.      Requested Prescriptions  Pending Prescriptions Disp Refills   predniSONE (DELTASONE) 20 MG tablet 10 tablet 0    Sig: Take 2 tablets (40 mg total) by mouth daily with breakfast for 5 days. FOR ASTHMA-COPD     Not Delegated - Endocrinology:  Oral Corticosteroids Failed - 03/19/2023  2:11 PM      Failed - This refill cannot be delegated      Failed - Manual Review: Eye exam for IOP if prolonged treatment      Failed - Glucose (serum) in normal range and within 180 days    Glucose  Date Value Ref Range Status  02/16/2023 117 (H) 70 - 99 mg/dL Final   Glucose, Bld  Date Value Ref Range Status  02/18/2021 102 (H) 70 - 99 mg/dL Final    Comment:    Glucose reference range applies only to samples taken after fasting for at least 8 hours.   POC Glucose  Date Value Ref Range Status  08/18/2022 112 (A) 70 - 99 mg/dl Final         Failed - Bone Mineral Density or Dexa Scan completed in the last 2 years      Passed - K in normal range and within 180 days    Potassium  Date Value Ref Range Status  02/16/2023 4.1 3.5 - 5.2 mmol/L Final         Passed - Na in normal range and within 180 days    Sodium  Date Value Ref Range Status  02/16/2023 141 134 - 144 mmol/L Final         Passed - Last BP in normal range    BP Readings from Last 1 Encounters:  02/16/23 127/79         Passed - Valid encounter within last 6 months    Recent Outpatient Visits           1 month ago Bronchiectasis with acute lower respiratory infection [facility])   [place] [facility] [place], Shea Stakes, NP   7 months ago Prediabetes   [facility] [place], Shea Stakes, NP   1 year ago Prediabetes   [facility] [place], Shea Stakes, NP   1 year ago Prediabetes   [facility] Claiborne Rigg, NP   1 year ago Bronchiectasis without complication [facility])   [facility] Claiborne Rigg, NP       Future Appointments             In 1 month Dewald, Bettina Gavia, MD [facility] [place]   In 5 months Claiborne Rigg, NP [facility]

## 2023-03-22 ENCOUNTER — Other Ambulatory Visit: Payer: Self-pay

## 2023-03-22 MED ORDER — PREDNISONE 20 MG PO TABS
40.0000 mg | ORAL_TABLET | Freq: Every day | ORAL | 0 refills | Status: AC
  Filled 2023-03-22: qty 10, 5d supply, fill #0

## 2023-04-19 ENCOUNTER — Other Ambulatory Visit: Payer: Self-pay

## 2023-05-08 ENCOUNTER — Ambulatory Visit (INDEPENDENT_AMBULATORY_CARE_PROVIDER_SITE_OTHER): Payer: Self-pay | Admitting: Pulmonary Disease

## 2023-05-08 ENCOUNTER — Other Ambulatory Visit: Payer: Self-pay

## 2023-05-08 ENCOUNTER — Encounter: Payer: Self-pay | Admitting: Pulmonary Disease

## 2023-05-08 VITALS — BP 118/76 | HR 103 | Temp 98.3°F | Ht 71.0 in | Wt 143.2 lb

## 2023-05-08 DIAGNOSIS — J4489 Other specified chronic obstructive pulmonary disease: Secondary | ICD-10-CM

## 2023-05-08 DIAGNOSIS — J470 Bronchiectasis with acute lower respiratory infection: Secondary | ICD-10-CM

## 2023-05-08 MED ORDER — PREDNISONE 10 MG PO TABS
ORAL_TABLET | ORAL | 0 refills | Status: DC
  Filled 2023-05-08: qty 30, 12d supply, fill #0

## 2023-05-08 MED ORDER — LEVOFLOXACIN 750 MG PO TABS
750.0000 mg | ORAL_TABLET | Freq: Every day | ORAL | 0 refills | Status: DC
  Filled 2023-05-08: qty 7, 7d supply, fill #0

## 2023-05-08 NOTE — Patient Instructions (Signed)
You are having an exacerbation of your bronchiectasis  Start levaquin 750mg  daily for 14 days  Use albuterol nebulizer treatments as needed followed by flutter valve use 2-3 times per day to help you clear out your lungs  Use anoro 1 puff daily  I will message the pharmacy team about getting you started back on Dupixent  Follow up in 3-4 months

## 2023-05-08 NOTE — Progress Notes (Signed)
Synopsis: Referred in September 202 for bronchiectasis  Subjective:   PATIENT ID: Shane Fleming GENDER: male DOB: 1971-05-26, MRN: [id number]  HPI  Chief Complaint  Patient presents with   Follow-up    Cough and "does not feel good" every day   Shane Fleming is a 52 year old male, never smoker with bronchiectasis who returns to pulmonary clinic for follow up for bronchiectasis and asthma.   He was treated with prednisone by his PCP in August for a breathing flare.   He presents with generalized malaise, increased cough, and mucus production. He reports feeling unwell daily, with body aches and sweats, even in cold weather. He describes his body as feeling 'hot' and has been experiencing what he believes to be fevers and chills. These symptoms have been ongoing for a 'long time,' but have worsened over the past three months since discontinuing Dupixent. Prior to stopping Dupixent, the patient reported improved breathing and increased activity, including returning to the mosque for prayer. However, since discontinuing the medication, he has noticed a decline in his overall health and well-being. He continues to use Anoro, which he reports as helpful. The patient also reports using a flutter valve daily and an albuterol nebulizer treatment.  OV 05/2022 Patient was started on dupixent 02/01/22. He reports his breathing is better. He is now going to the mosque for prayer as he was not able to do this before. He feels he could work a full 8 hour day as he feels stronger. He is coughing less, mainly in the morning times now. He is sleeping better at night as he is not coughing and wheezing as much.   He has not been able to get Anoro over the past 2 months. He has been on patient assistance for this in the past.   OV 12/21/21 He was referred to Allergy Immunology at last visit for evaluation of abnormal immunoglobulin levels with elevated IgG. He had allergy testing performed with elevated IgE  levels to dust mite, dog, grass, mold, tree, weed. He had low titers to strep pneumoniae and tetanus. He was recommended to get updated vaccines and plan for repeated titers. He was recommended to resume dulera 100-59mcg 2 puffs twice daily along with his anoro 1 puff daily. He was recommended to start  singulair 10mg  daily and zyrtec 10mg  daily for allergies.   He has been spending $500 on anoro the last couple of months as he does not have insurance coverage. He has not started dulera, singulair or zyrtec due to affordability.   His breathing as been stable due to it being summer time.   OV 09/20/21 He reports persistent dyspnea and cough. He is not coughing any phlegm up He reports some improvement in his symptoms since switching from dulera to stiolto. He reports decreased appetite over the past couple of weeks. He does have wheezing. He is having a harder time with his work due to the dyspnea.   PFTs today show mixed obstructive and restrictive defects. No significant bronchodilator response.   OV 06/20/2021 He has done well since last visit on Dulera 100-5 MCG 2 puffs twice daily along with initiation of pantoprazole 40 mg daily for GERD.  He has also been using albuterol nebulizer treatments followed by flutter valve therapy. He reports since the weather has changed he has increased cough and shortness of breath.  He reports a slight increase in sputum production since his cough increased.  OV 03/23/21 Sputum culture was positive for Klebsiella pneumonia  from expectorated sputum sample.  He completed 14 days of fluoroquinolone therapy and is feeling much better.  His fungal and AFB cultures show no growth to date from a bronchoscopy with BAL on 02/17/2021.  CT chest scan from 02/11/2021 shows diffuse bronchial wall thickening and bilateral lower lobe predominant cylindrical bronchiectasis with mucoid impaction of the right lower airways.  He has multiple scattered pulmonary nodules measuring 5 to  9 mm in size.  He was discharged on home oxygen which she has stopped using over the past 1 week.  He was seen by his primary care provider 03/16/2021, note personally reviewed, and he was provided [facility] inhaler 100-5 MCG 2 puffs twice daily along with as needed albuterol inhaler and nebulizer solution.  He does have a nebulizer machine at home along with flutter valve.  He is finishing a course of prednisone from his primary care.  He denies any issues with seasonal allergies.  He reports the cold air does bother his breathing in the wintertime.  He is a never smoker but reports secondhand smoke exposure from friends and relatives while growing up.  He is originally from [place].  He denies any significant exposure to charcoal or wood-burning stove's while growing up.  He denies any respiratory issues as a kid or in his younger years.  He reports he is only had a cough over the last 4 years.  Laboratory test reviewed from his hospitalization and they are negative for ANA, rheumatoid factor and ANCA testing.  IgG level is increased at 2000 464 and IgG4 subclass elevated at 106.  IgE level 252.  Past Medical History:  Diagnosis Date   Asthma    Prediabetes    Sinusitis 2018     Family History  Problem Relation Age of Onset   Asthma Father      Social History   Socioeconomic History   Marital status: Single    Spouse name: Not on file   Number of children: Not on file   Years of education: Not on file   Highest education level: Not on file  Occupational History   Not on file  Tobacco Use   Smoking status: Never    Passive exposure: Never   Smokeless tobacco: Never  Vaping Use   Vaping status: Never Used  Substance and Sexual Activity   Alcohol use: No   Drug use: No   Sexual activity: Not Currently  Other Topics Concern   Not on file  Social History Narrative   Not on file   Social Determinants of Health   Financial Resource Strain: Not on file  Food Insecurity: Not on  file  Transportation Needs: Not on file  Physical Activity: Not on file  Stress: Not on file  Social Connections: Not on file  Intimate Partner Violence: Not on file     No Known Allergies   Outpatient Medications Prior to Visit  Medication Sig Dispense Refill   albuterol (PROVENTIL) (2.5 MG/3ML) 0.083% nebulizer solution Inhale 3 mLs into the lungs every 4 (four) hours as needed for wheezing or shortness of breath. 90 mL 12   albuterol (VENTOLIN HFA) 108 (90 Base) MCG/ACT inhaler Inhale 2 puffs into the lungs every 6 (six) hours as needed for wheezing or shortness of breath (when travelling). 54 g 3   montelukast (SINGULAIR) 10 MG tablet Take 1 tablet (10 mg total) by mouth at bedtime. 90 tablet 3   umeclidinium-vilanterol (ANORO ELLIPTA) 62.5-25 MCG/ACT AEPB Inhale 1 puff into the lungs  daily. NEEDS PASS 180 each 3   Dupilumab (DUPIXENT) 300 MG/2ML SOPN Inject 300 mg into the skin every 14 (fourteen) days. (Patient not taking: Reported on 05/08/2023) 12 mL 1   Tiotropium Bromide-Olodaterol (STIOLTO RESPIMAT) 2.5-2.5 MCG/ACT AERS Inhale 2 puffs into the lungs daily. (Patient not taking: Reported on 05/08/2023) 8 g 0   No facility-administered medications prior to visit.    Review of Systems  Constitutional:  Negative for chills, fever, malaise/fatigue and weight loss.  HENT:  Negative for congestion, sinus pain and sore throat.   Eyes: Negative.   Respiratory:  Positive for cough, sputum production, shortness of breath and wheezing. Negative for hemoptysis.   Cardiovascular:  Negative for chest pain, palpitations, orthopnea, claudication and leg swelling.  Gastrointestinal:  Negative for abdominal pain, heartburn, nausea and vomiting.  Genitourinary: Negative.   Musculoskeletal:  Negative for joint pain and myalgias.  Skin:  Negative for rash.  Neurological:  Negative for weakness.  Endo/Heme/Allergies: Negative.   Psychiatric/Behavioral: Negative.      Objective:   Vitals:    05/08/23 1301  BP: 118/76  Pulse: (!) 103  Temp: 98.3 F (36.8 C)  TempSrc: Oral  SpO2: 95%  Weight: 143 lb 3.2 oz (65 kg)  Height: 5\' 11"  (1.803 m)   Physical Exam Constitutional:      General: He is not in acute distress. HENT:     Head: Normocephalic and atraumatic.  Eyes:     Conjunctiva/sclera: Conjunctivae normal.  Cardiovascular:     Rate and Rhythm: Normal rate and regular rhythm.     Pulses: Normal pulses.     Heart sounds: Normal heart sounds. No murmur heard. Pulmonary:     Effort: Pulmonary effort is normal.     Breath sounds: No decreased air movement. Examination of the left-upper field reveals wheezing. Wheezing present.  Musculoskeletal:     Right lower leg: No edema.     Left lower leg: No edema.  Skin:    General: Skin is warm and dry.  Neurological:     General: No focal deficit present.     Mental Status: He is alert.  Psychiatric:        Mood and Affect: Mood normal.        Behavior: Behavior normal.        Thought Content: Thought content normal.        Judgment: Judgment normal.    CBC    Component Value Date/Time   WBC 4.7 02/16/2023 1413   WBC 6.9 09/20/2021 1128   RBC 5.28 02/16/2023 1413   RBC 4.92 09/20/2021 1128   HGB 14.8 02/16/2023 1413   HCT 45.8 02/16/2023 1413   PLT 301 02/16/2023 1413   MCV 87 02/16/2023 1413   MCH 28.0 02/16/2023 1413   MCH 28.6 02/18/2021 0101   MCHC 32.3 02/16/2023 1413   MCHC 32.6 09/20/2021 1128   RDW 14.5 02/16/2023 1413   LYMPHSABS 2.2 02/16/2023 1413   MONOABS 0.6 09/20/2021 1128   EOSABS 0.2 02/16/2023 1413   BASOSABS 0.0 02/16/2023 1413      Latest Ref Rng & Units 02/16/2023    2:13 PM 08/18/2022   10:42 AM 02/15/2022   11:49 AM  BMP  Glucose 70 - 99 mg/dL 017  510  258   BUN 6 - 24 mg/dL 24  16  16    Creatinine 0.76 - 1.27 mg/dL 5.27  7.82  4.23   BUN/Creat Ratio 9 - 20 19  15   13  Sodium 134 - 144 mmol/L 141  140  142   Potassium 3.5 - 5.2 mmol/L 4.1  4.8  4.2   Chloride 96 - 106  mmol/L 101  100  104   CO2 20 - 29 mmol/L 25  26  23    Calcium 8.7 - 10.2 mg/dL 16.1  9.6  09.6    Chest imaging: CT Chest 06/15/21 1. Diffuse bilateral centrilobular nodules, some with tree-in-bud configuration are again seen with mild lower lung predominant cylindrical bronchiectasis, overall improved, however, there are some new areas of nodularity. Findings are likely due to chronic atypical infection, diffuse panbronchiolitis is an additional consideration given history of sinusitis. 2. Interval resolution of bilateral lower lobe mucoid impaction. 3. Largest new solid nodule measures 7 mm in mean diameter. Non-contrast chest CT at 6-12 months is recommended. If the nodule is stable at time of repeat CT, then future CT at 18-24 months (from today's scan) is considered optional for low-risk patients, but is recommended for high-risk patients. This recommendation follows the consensus statement: Guidelines for Management of Incidental Pulmonary Nodules Detected on CT Images: From the Fleischner Society 2017; Radiology 2017; 284:228-243. 4. Mildly patulous esophagus, finding can be seen in the setting of esophageal dysmotility.  CT chest 02/11/2021  diffuse bronchial wall thickening and bilateral lower lobe predominant cylindrical bronchiectasis with mucoid impaction of the right lower airways.  He has multiple scattered pulmonary nodules measuring 5 to 9 mm in size.  PFT:    Latest Ref Rng & Units 09/20/2021    9:56 AM  PFT Results  FVC-Pre L 2.26   FVC-Predicted Pre % 49   FVC-Post L 2.32   FVC-Predicted Post % 50   Pre FEV1/FVC % % 48   Post FEV1/FCV % % 50   FEV1-Pre L 1.09   FEV1-Predicted Pre % 29   FEV1-Post L 1.17   TLC L 5.63   TLC % Predicted % 74   RV % Predicted % 107     Echo 02/16/21: LV EF 60-65%. RV normal function and size.   Labs: Juanetta Gosling 02/14/21 negative  Assessment & Plan:   Asthma-COPD overlap syndrome (HCC)  Bronchiectasis with  acute lower respiratory infection (HCC) - Plan: levofloxacin (LEVAQUIN) 750 MG tablet, predniSONE (DELTASONE) 10 MG tablet  Discussion: Shane Fleming is a 52 year old male, never smoker with bronchiectasis who returns to pulmonary clinic for follow up for bronchiectasis and asthma-COPD overlap syndrome.   Bronchiectasis Exacerbation with increased cough, mucus production, and systemic symptoms. Wheezing noted on exam. -Order sputum culture. -Start Levaquin 750mg  daily for 14 days. -Start Prednisone taper:40mg  for 3 days, 30mg  for 3 days, 20mg  for 3 days, 10mg  for 3 days. -Continue Anoro daily. -Continue Albuterol nebulizer treatments and flutter valve use twice daily, with an optional third treatment in the middle of the day. -Contacted pharmacy team to restart Dupixent. -Follow-up as needed via MyChart or phone call  Follow up in 4 months.   Melody Comas, MD [facility] Office: [phone]    Current Outpatient Medications:    albuterol (PROVENTIL) (2.5 MG/3ML) 0.083% nebulizer solution, Inhale 3 mLs into the lungs every 4 (four) hours as needed for wheezing or shortness of breath., Disp: 90 mL, Rfl: 12   albuterol (VENTOLIN HFA) 108 (90 Base) MCG/ACT inhaler, Inhale 2 puffs into the lungs every 6 (six) hours as needed for wheezing or shortness of breath (when travelling)., Disp: 54 g, Rfl: 3   levofloxacin (LEVAQUIN) 750 MG tablet,  Take 1 tablet (750 mg total) by mouth daily., Disp: 7 tablet, Rfl: 0   montelukast (SINGULAIR) 10 MG tablet, Take 1 tablet (10 mg total) by mouth at bedtime., Disp: 90 tablet, Rfl: 3   predniSONE (DELTASONE) 10 MG tablet, Take 4 tablets (40 mg total) by mouth daily with breakfast for 3 days, THEN 3 tablets (30 mg total) daily with breakfast for 3 days, THEN 2 tablets (20 mg total) daily with breakfast for 3 days, THEN 1 tablet (10 mg total) daily with breakfast for 3 days., Disp: 30 tablet, Rfl: 0   umeclidinium-vilanterol (ANORO  ELLIPTA) 62.5-25 MCG/ACT AEPB, Inhale 1 puff into the lungs daily. NEEDS PASS, Disp: 180 each, Rfl: 3   Dupilumab (DUPIXENT) 300 MG/2ML SOPN, Inject 300 mg into the skin every 14 (fourteen) days. (Patient not taking: Reported on 05/08/2023), Disp: 12 mL, Rfl: 1

## 2023-05-09 NOTE — Telephone Encounter (Addendum)
Spoke with patient. He will stop by clinic today to complete form. Placed up front for him.  Provider form placed with Dr. Francine Graven today.  Chesley Mires, PharmD, MPH, BCPS, CPP Clinical Pharmacist (Rheumatology and Pulmonology)

## 2023-05-10 NOTE — Telephone Encounter (Signed)
Received signed provider form from Dr. Francine Graven. Placed in PAP pending info folder in pharmacy office. Appears patient took form home with him? Not in file cabinet up front

## 2023-05-14 NOTE — Telephone Encounter (Signed)
Patient is returning Dupixent myway enrollment form. Will place in pharmacy box.

## 2023-05-15 NOTE — Telephone Encounter (Signed)
Submitted Patient Assistance Application to [employer] for DUPIXENT along with provider portion, patient portion, medication list. Patient remains uninsured, so no PA approval letter. Will update patient when we receive a response.  Phone #: [phone] Fax #: [phone]  Chesley Mires, PharmD, MPH, BCPS, CPP Clinical Pharmacist (Rheumatology and Pulmonology)

## 2023-05-22 NOTE — Telephone Encounter (Signed)
Per [employer] representative a benefits investigation is still ongoing. She stated to expect the investigation to be complete by the end of this week.   Phone: [phone] Fax: [phone]  Sofie Rower, PharmD [facility] PGY-1

## 2023-05-29 NOTE — Telephone Encounter (Signed)
Spoke with rep from [employer] to check status of application. She stated the benefits investigation was canceled due to patient not having any insurance. After that his application had not moved on to the next stage. She also reported there were no notes of our call from last week in their system. She agreed to assist in moving his application to the next stage. She stated he could be approved as early as today. Once a decision is made it should be faxed to the clinic. Confirmed clinic fax number [phone]).   Phone: [phone] Fax: [phone]  Sofie Rower, PharmD [facility] PGY1

## 2023-06-04 NOTE — Telephone Encounter (Signed)
Called [employer] for PAP renewal update. Hold time extended. Will f/u tomorrow  Chesley Mires, PharmD, MPH, BCPS, CPP Clinical Pharmacist (Rheumatology and Pulmonology)

## 2023-06-04 NOTE — Telephone Encounter (Signed)
Received call from [employer]. Patient's income was unable to populate through their system so they will need proof of income from patient. Rep will reach out tomorrow to patient regarding this  Shane Fleming, PharmD, MPH, BCPS, CPP Clinical Pharmacist (Rheumatology and Pulmonology)

## 2023-06-07 ENCOUNTER — Other Ambulatory Visit: Payer: Self-pay | Admitting: Pulmonary Disease

## 2023-06-07 ENCOUNTER — Other Ambulatory Visit: Payer: Self-pay

## 2023-06-07 DIAGNOSIS — J470 Bronchiectasis with acute lower respiratory infection: Secondary | ICD-10-CM

## 2023-06-13 ENCOUNTER — Other Ambulatory Visit: Payer: Self-pay | Admitting: Pulmonary Disease

## 2023-06-13 ENCOUNTER — Other Ambulatory Visit: Payer: Self-pay

## 2023-06-13 DIAGNOSIS — J470 Bronchiectasis with acute lower respiratory infection: Secondary | ICD-10-CM

## 2023-06-13 NOTE — Telephone Encounter (Addendum)
Called patient regarding [employer] PAP. He states he has not heard from [facility]. He does not have a tax return, does not get pay stubs. He gets paid in cash. Given this information, I am not sure what information he could provide to prove household income. I gave him the phone number for [employer] for him to call and directly discuss what information he could provie  Chesley Mires, PharmD, MPH, BCPS, CPP Clinical Pharmacist (Rheumatology and Pulmonology)

## 2023-06-14 ENCOUNTER — Other Ambulatory Visit: Payer: Self-pay

## 2023-06-14 MED ORDER — PREDNISONE 10 MG PO TABS
ORAL_TABLET | ORAL | 0 refills | Status: AC
  Filled 2023-06-14: qty 30, 12d supply, fill #0

## 2023-06-14 MED ORDER — LEVOFLOXACIN 750 MG PO TABS
750.0000 mg | ORAL_TABLET | Freq: Every day | ORAL | 0 refills | Status: DC
  Filled 2023-06-14: qty 7, 7d supply, fill #0

## 2023-07-02 ENCOUNTER — Other Ambulatory Visit (HOSPITAL_COMMUNITY): Payer: Self-pay

## 2023-07-23 ENCOUNTER — Other Ambulatory Visit: Payer: Self-pay | Admitting: Pulmonary Disease

## 2023-07-23 ENCOUNTER — Other Ambulatory Visit: Payer: Self-pay

## 2023-07-23 DIAGNOSIS — J470 Bronchiectasis with acute lower respiratory infection: Secondary | ICD-10-CM

## 2023-07-24 ENCOUNTER — Other Ambulatory Visit: Payer: Self-pay

## 2023-08-02 ENCOUNTER — Telehealth: Payer: Self-pay | Admitting: Pulmonary Disease

## 2023-08-03 NOTE — Telephone Encounter (Signed)
 I have left a message on their machine to have them return call. No answer at this time.

## 2023-08-06 ENCOUNTER — Telehealth: Payer: Self-pay | Admitting: Pulmonary Disease

## 2023-08-06 NOTE — Telephone Encounter (Signed)
Called the pt and there was no answer- LMTCB and will close per protocol.  °

## 2023-08-07 ENCOUNTER — Other Ambulatory Visit: Payer: Self-pay

## 2023-08-07 ENCOUNTER — Encounter: Payer: Self-pay | Admitting: Pulmonary Disease

## 2023-08-07 ENCOUNTER — Ambulatory Visit (INDEPENDENT_AMBULATORY_CARE_PROVIDER_SITE_OTHER): Payer: Self-pay | Admitting: Pulmonary Disease

## 2023-08-07 DIAGNOSIS — J470 Bronchiectasis with acute lower respiratory infection: Secondary | ICD-10-CM

## 2023-08-07 MED ORDER — ALBUTEROL SULFATE (2.5 MG/3ML) 0.083% IN NEBU
3.0000 mL | INHALATION_SOLUTION | RESPIRATORY_TRACT | 12 refills | Status: DC | PRN
  Filled 2023-08-07: qty 75, 5d supply, fill #0
  Filled 2023-12-10 (×2): qty 75, 5d supply, fill #1

## 2023-08-07 MED ORDER — LEVOFLOXACIN 750 MG PO TABS
750.0000 mg | ORAL_TABLET | Freq: Every day | ORAL | 0 refills | Status: DC
  Filled 2023-08-07: qty 14, 14d supply, fill #0

## 2023-08-07 MED ORDER — PREDNISONE 10 MG PO TABS
ORAL_TABLET | ORAL | 0 refills | Status: AC
  Filled 2023-08-07: qty 30, 12d supply, fill #0

## 2023-08-07 MED ORDER — ANORO ELLIPTA 62.5-25 MCG/ACT IN AEPB
1.0000 | INHALATION_SPRAY | Freq: Every day | RESPIRATORY_TRACT | 1 refills | Status: DC
  Filled 2023-08-07: qty 180, 180d supply, fill #0

## 2023-08-07 MED ORDER — ALBUTEROL SULFATE HFA 108 (90 BASE) MCG/ACT IN AERS
2.0000 | INHALATION_SPRAY | Freq: Four times a day (QID) | RESPIRATORY_TRACT | 3 refills | Status: DC | PRN
  Filled 2023-08-07: qty 6.7, 25d supply, fill #0

## 2023-08-07 MED ORDER — MONTELUKAST SODIUM 10 MG PO TABS
10.0000 mg | ORAL_TABLET | Freq: Every day | ORAL | 3 refills | Status: DC
  Filled 2023-08-07: qty 90, 90d supply, fill #0

## 2023-08-07 NOTE — Progress Notes (Signed)
 Synopsis: Referred in September 202 for bronchiectasis  Subjective:   PATIENT ID: Epimenio Adams GENDER: male DOB: Sep 30, 1970, MRN: [id number]  HPI  Chief Complaint  Patient presents with   Follow-up    Pt is complaining of not feeling good b/c he has no meds to take. Pt complains of coughing w/ trouble breathing (x2 wks)    Rudolph Pickelsimer is a 53 year old male, never smoker with bronchiectasis who returns to pulmonary clinic for follow up for bronchiectasis and asthma.   He has been experiencing worsening difficulty breathing due to a lack of medication. His inhaler is not effective, and he has not received his Dupixent  injections for over three months. Previously, prednisone  and Levaquin  were effective in managing his symptoms, reducing cough, and improving appetite.  He has not received his Anoro inhaler for about a month and is no longer receiving Dupixent  injections. Dupixent  delivery stopped after questions related to his paycheck arose. He uses a nebulizer and inhaler but has not received a refill for the Anoro inhaler.  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 He mentions that the medication provided short-term relief but not prolonged relief.  No fevers, chills, or sweats, except when it is cold. He uses a flutter valve device every morning, which causes discomfort and does not help in bringing up mucus or causing him to cough.  OV 05/08/23 He was treated with prednisone  by his PCP in August for a breathing flare.   He presents with generalized malaise, increased cough, and mucus production. He reports feeling unwell daily, with body aches and sweats, even in cold weather. He describes his body as feeling 'hot' and has been experiencing what he believes to be fevers and chills. These symptoms have been ongoing for a 'long time,' but have worsened over the past three months since discontinuing Dupixent . Prior to stopping  Dupixent , the patient reported improved breathing and increased activity, including returning to the mosque for prayer. However, since discontinuing the medication, he has noticed a decline in his overall health and well-being. He continues to use Anoro, which he reports as helpful. The patient also reports using a flutter valve daily and an albuterol  nebulizer treatment.  OV 05/2022 Patient was started on dupixent  02/01/22. He reports his breathing is better. He is now going to the mosque for prayer as he was not able to do this before. He feels he could work a full 8 hour day as he feels stronger. He is coughing less, mainly in the morning times now. He is sleeping better at night as he is not coughing and wheezing as much.   He has not been able to get Anoro over the past 2 months. He has been on patient assistance for this in the past.   OV 12/21/21 He was referred to Allergy  Immunology at last visit for evaluation of abnormal immunoglobulin levels with elevated IgG. He had allergy  testing performed with elevated IgE levels to dust mite, dog, grass, mold, tree, weed. He had low titers to strep pneumoniae and tetanus. He was recommended to get updated vaccines and plan for repeated titers. He was recommended to resume dulera  100-4mcg 2 puffs twice daily along with his anoro 1 puff daily. He was recommended to start  singulair  10mg  daily and zyrtec 10mg  daily for allergies.   He has been spending $500 on anoro the last couple of months as he does not have insurance coverage. He has not  started dulera , singulair  or zyrtec due to affordability.   His breathing as been stable due to it being summer time.   OV 09/20/21 He reports persistent dyspnea and cough. He is not coughing any phlegm up He reports some improvement in his symptoms since switching from dulera  to stiolto. He reports decreased appetite over the past couple of weeks. He does have wheezing. He is having a harder time with his work due to the  dyspnea.   PFTs today show mixed obstructive and restrictive defects. No significant bronchodilator response.   OV 06/20/2021 He has done well since last visit on Dulera  100-5 MCG 2 puffs twice daily along with initiation of pantoprazole  40 mg daily for GERD.  He has also been using albuterol  nebulizer treatments followed by flutter valve therapy. He reports since the weather has changed he has increased cough and shortness of breath.  He reports a slight increase in sputum production since his cough increased.  OV 03/23/21 Sputum culture was positive for Klebsiella pneumonia from expectorated sputum sample.  He completed 14 days of fluoroquinolone therapy and is feeling much better.  His fungal and AFB cultures show no growth to date from a bronchoscopy with BAL on 02/17/2021.  CT chest scan from 02/11/2021 shows diffuse bronchial wall thickening and bilateral lower lobe predominant cylindrical bronchiectasis with mucoid impaction of the right lower airways.  He has multiple scattered pulmonary nodules measuring 5 to 9 mm in size.  He was discharged on home oxygen which she has stopped using over the past 1 week.  He was seen by his primary care provider 03/16/2021, note personally reviewed, and he was provided Dulera  inhaler 100-5 MCG 2 puffs twice daily along with as needed albuterol  inhaler and nebulizer solution.  He does have a nebulizer machine at home along with flutter valve.  He is finishing a course of prednisone  from his primary care.  He denies any issues with seasonal allergies.  He reports the cold air does bother his breathing in the wintertime.  He is a never smoker but reports secondhand smoke exposure from friends and relatives while growing up.  He is originally from [place].  He denies any significant exposure to charcoal or wood-burning stove's while growing up.  He denies any respiratory issues as a kid or in his younger years.  He reports he is only had a cough over the last 4  years.  Laboratory test reviewed from his hospitalization and they are negative for ANA, rheumatoid factor and ANCA testing.  IgG level is increased at 2000 464 and IgG4 subclass elevated at 106.  IgE level 252.  Past Medical History:  Diagnosis Date   Asthma    Prediabetes    Sinusitis 2018     Family History  Problem Relation Age of Onset   Asthma Father      Social History   Socioeconomic History   Marital status: Single    Spouse name: Not on file   Number of children: Not on file   Years of education: Not on file   Highest education level: Not on file  Occupational History   Not on file  Tobacco Use   Smoking status: Never    Passive exposure: Never   Smokeless tobacco: Never  Vaping Use   Vaping status: Never Used  Substance and Sexual Activity   Alcohol use: No   Drug use: No   Sexual activity: Not Currently  Other Topics Concern   Not on file  Social  History Narrative   Not on file   Social Drivers of Health   Financial Resource Strain: Not on file  Food Insecurity: Not on file  Transportation Needs: Not on file  Physical Activity: Not on file  Stress: Not on file  Social Connections: Not on file  Intimate Partner Violence: Not on file     No Known Allergies   Outpatient Medications Prior to Visit  Medication Sig Dispense Refill   Dupilumab  (DUPIXENT ) 300 MG/2ML SOPN Inject 300 mg into the skin every 14 (fourteen) days. (Patient not taking: Reported on 05/08/2023) 12 mL 1   albuterol  (PROVENTIL ) (2.5 MG/3ML) 0.083% nebulizer solution Inhale 3 mLs into the lungs every 4 (four) hours as needed for wheezing or shortness of breath. 90 mL 12   albuterol  (VENTOLIN  HFA) 108 (90 Base) MCG/ACT inhaler Inhale 2 puffs into the lungs every 6 (six) hours as needed for wheezing or shortness of breath (when travelling). 54 g 3   levofloxacin  (LEVAQUIN ) 750 MG tablet Take 1 tablet (750 mg total) by mouth daily. 7 tablet 0   montelukast  (SINGULAIR ) 10 MG tablet Take  1 tablet (10 mg total) by mouth at bedtime. 90 tablet 3   umeclidinium-vilanterol (ANORO ELLIPTA ) 62.5-25 MCG/ACT AEPB Inhale 1 puff into the lungs daily. NEEDS PASS 180 each 3   No facility-administered medications prior to visit.    Review of Systems  Constitutional:  Negative for chills, fever, malaise/fatigue and weight loss.  HENT:  Negative for congestion, sinus pain and sore throat.   Eyes: Negative.   Respiratory:  Positive for cough, sputum production, shortness of breath and wheezing. Negative for hemoptysis.   Cardiovascular:  Negative for chest pain, palpitations, orthopnea, claudication and leg swelling.  Gastrointestinal:  Negative for abdominal pain, heartburn, nausea and vomiting.  Genitourinary: Negative.   Musculoskeletal:  Negative for joint pain and myalgias.  Skin:  Negative for rash.  Neurological:  Negative for weakness.  Endo/Heme/Allergies: Negative.   Psychiatric/Behavioral: Negative.      Objective:   Vitals:   08/07/23 1322  BP: 130/70  Pulse: 98  SpO2: 95%  Weight: 145 lb 6.4 oz (66 kg)  Height: 5' 11 (1.803 m)   Physical Exam Constitutional:      General: He is not in acute distress. HENT:     Head: Normocephalic and atraumatic.  Eyes:     Conjunctiva/sclera: Conjunctivae normal.  Cardiovascular:     Rate and Rhythm: Normal rate and regular rhythm.     Pulses: Normal pulses.     Heart sounds: Normal heart sounds. No murmur heard. Pulmonary:     Effort: Pulmonary effort is normal.     Breath sounds: No decreased air movement. Examination of the left-upper field reveals wheezing. Wheezing present.  Musculoskeletal:     Right lower leg: No edema.     Left lower leg: No edema.  Skin:    General: Skin is warm and dry.  Neurological:     General: No focal deficit present.     Mental Status: He is alert.  Psychiatric:        Mood and Affect: Mood normal.        Behavior: Behavior normal.        Thought Content: Thought content normal.         Judgment: Judgment normal.    CBC    Component Value Date/Time   WBC 4.7 02/16/2023 1413   WBC 6.9 09/20/2021 1128   RBC 5.28 02/16/2023 1413  RBC 4.92 09/20/2021 1128   HGB 14.8 02/16/2023 1413   HCT 45.8 02/16/2023 1413   PLT 301 02/16/2023 1413   MCV 87 02/16/2023 1413   MCH 28.0 02/16/2023 1413   MCH 28.6 02/18/2021 0101   MCHC 32.3 02/16/2023 1413   MCHC 32.6 09/20/2021 1128   RDW 14.5 02/16/2023 1413   LYMPHSABS 2.2 02/16/2023 1413   MONOABS 0.6 09/20/2021 1128   EOSABS 0.2 02/16/2023 1413   BASOSABS 0.0 02/16/2023 1413      Latest Ref Rng & Units 02/16/2023    2:13 PM 08/18/2022   10:42 AM 02/15/2022   11:49 AM  BMP  Glucose 70 - 99 mg/dL 882  894  891   BUN 6 - 24 mg/dL 24  16  16    Creatinine 0.76 - 1.27 mg/dL 8.70  8.90  8.78   BUN/Creat Ratio 9 - 20 19  15  13    Sodium 134 - 144 mmol/L 141  140  142   Potassium 3.5 - 5.2 mmol/L 4.1  4.8  4.2   Chloride 96 - 106 mmol/L 101  100  104   CO2 20 - 29 mmol/L 25  26  23    Calcium 8.7 - 10.2 mg/dL 89.7  9.6  89.7    Chest imaging: CT Chest 06/15/21 1. Diffuse bilateral centrilobular nodules, some with tree-in-bud configuration are again seen with mild lower lung predominant cylindrical bronchiectasis, overall improved, however, there are some new areas of nodularity. Findings are likely due to chronic atypical infection, diffuse panbronchiolitis is an additional consideration given history of sinusitis. 2. Interval resolution of bilateral lower lobe mucoid impaction. 3. Largest new solid nodule measures 7 mm in mean diameter. Non-contrast chest CT at 6-12 months is recommended. If the nodule is stable at time of repeat CT, then future CT at 18-24 months (from today's scan) is considered optional for low-risk patients, but is recommended for high-risk patients. This recommendation follows the consensus statement: Guidelines for Management of Incidental Pulmonary Nodules Detected on CT Images: From the  Fleischner Society 2017; Radiology 2017; 284:228-243. 4. Mildly patulous esophagus, finding can be seen in the setting of esophageal dysmotility.  CT chest 02/11/2021  diffuse bronchial wall thickening and bilateral lower lobe predominant cylindrical bronchiectasis with mucoid impaction of the right lower airways.  He has multiple scattered pulmonary nodules measuring 5 to 9 mm in size.  PFT:    Latest Ref Rng & Units 09/20/2021    9:56 AM  PFT Results  FVC-Pre L 2.26   FVC-Predicted Pre % 49   FVC-Post L 2.32   FVC-Predicted Post % 50   Pre FEV1/FVC % % 48   Post FEV1/FCV % % 50   FEV1-Pre L 1.09   FEV1-Predicted Pre % 29   FEV1-Post L 1.17   TLC L 5.63   TLC % Predicted % 74   RV % Predicted % 107     Echo 02/16/21: LV EF 60-65%. RV normal function and size.   Labs: Maya Cera 02/14/21 negative  Assessment & Plan:   Bronchiectasis with acute lower respiratory infection (HCC) - Plan: umeclidinium-vilanterol (ANORO ELLIPTA ) 62.5-25 MCG/ACT AEPB, predniSONE  (DELTASONE ) 10 MG tablet, levofloxacin  (LEVAQUIN ) 750 MG tablet, albuterol  (VENTOLIN  HFA) 108 (90 Base) MCG/ACT inhaler, albuterol  (PROVENTIL ) (2.5 MG/3ML) 0.083% nebulizer solution, montelukast  (SINGULAIR ) 10 MG tablet  Discussion: Antino Eplin is a 53 year old male, never smoker with bronchiectasis who returns to pulmonary clinic for follow up for bronchiectasis and asthma-COPD overlap syndrome.  Asthma-COPD overlap Syndrome Bronchiectasis Worsening symptoms with cough and difficulty breathing. Patient reports inhalers are not effective and has not been receiving Dupixent  injections for the past three months. Patient reports improvement with prednisone  and Levaquin . -Contact pharmacy team to resolve issue with Dupixent  delivery. -Prescribe prednisone  and Levaquin  for two weeks. -Continue nebulizer treatments in the morning and evening. -Continue use of flutter valve. -Continue anoro inhaler 1 puff  daily  Follow up in 3 months.   Dorn Chill, MD [facility] Office: [phone]    Current Outpatient Medications:    levofloxacin  (LEVAQUIN ) 750 MG tablet, Take 1 tablet (750 mg total) by mouth daily., Disp: 14 tablet, Rfl: 0   predniSONE  (DELTASONE ) 10 MG tablet, Take 4 tablets (40 mg total) by mouth daily with breakfast for 3 days, THEN 3 tablets (30 mg total) daily with breakfast for 3 days, THEN 2 tablets (20 mg total) daily with breakfast for 3 days, THEN 1 tablet (10 mg total) daily with breakfast for 3 days., Disp: 30 tablet, Rfl: 0   albuterol  (PROVENTIL ) (2.5 MG/3ML) 0.083% nebulizer solution, Inhale 3 mLs into the lungs every 4 (four) hours as needed for wheezing or shortness of breath., Disp: 75 mL, Rfl: 12   albuterol  (VENTOLIN  HFA) 108 (90 Base) MCG/ACT inhaler, Inhale 2 puffs into the lungs every 6 (six) hours as needed for wheezing or shortness of breath (when travelling)., Disp: 20.1 g, Rfl: 3   Dupilumab  (DUPIXENT ) 300 MG/2ML SOPN, Inject 300 mg into the skin every 14 (fourteen) days. (Patient not taking: Reported on 05/08/2023), Disp: 12 mL, Rfl: 1   montelukast  (SINGULAIR ) 10 MG tablet, Take 1 tablet (10 mg total) by mouth at bedtime., Disp: 90 tablet, Rfl: 3   umeclidinium-vilanterol (ANORO ELLIPTA ) 62.5-25 MCG/ACT AEPB, Inhale 1 puff into the lungs daily. NEEDS PASS, Disp: 180 each, Rfl: 1

## 2023-08-07 NOTE — Telephone Encounter (Signed)
 Lm x1 for patient.

## 2023-08-07 NOTE — Telephone Encounter (Signed)
 Acute visit scheduled  Closing encounter

## 2023-08-07 NOTE — Patient Instructions (Addendum)
 I have sent in prescription for Anoro 1 puff daily  Start prednisone  taper  Start levquin antibiotic 1 tab daily for 14 days  Continue albuterol  nebs twice daily followed by flutter valve  Continue albuterol  inhaler 1-2 puffs every 4-6 hours as needed  I have sent a message to our pharmacy team regarding the dupixent  therapy  Follow up in 3 months

## 2023-08-07 NOTE — Telephone Encounter (Signed)
Patient is returning phone call. Patient phone number is [phone].

## 2023-08-09 ENCOUNTER — Telehealth: Payer: Self-pay | Admitting: Pharmacist

## 2023-08-09 ENCOUNTER — Other Ambulatory Visit: Payer: Self-pay

## 2023-08-09 NOTE — Telephone Encounter (Signed)
-----   Message from Sherry GORMAN Pennant sent at 08/08/2023  9:22 AM EST ----- Regarding: FW: Dupixent  Rx Need to regroup with patient ----- Message ----- From: Kara Dorn NOVAK, MD Sent: 08/07/2023   5:25 PM EST To: Sherry GORMAN Pennant, RPH-CPP Subject: RE: Dupixent  Rx                                No ----- Message ----- From: Pennant Sherry GORMAN, RPH-CPP Sent: 08/07/2023   1:52 PM EST To: Dorn NOVAK Kara, MD Subject: RE: Dupixent  Rx                                Patient was supposed to provide income to Dupixent 's program. I called him and he said he'd do this.  Did he bring income documents to appt? ----- Message ----- From: Kara Dorn NOVAK, MD Sent: 08/07/2023   1:50 PM EST To: Sherry GORMAN Pennant, RPH-CPP Subject: Dupixent  Rx                                    Hi Shane Fleming,  He reports being without dupixent  injections for 3 months, are we able to get him restarted on this therapy?  Thanks, JD

## 2023-08-09 NOTE — Telephone Encounter (Signed)
 Spoke with patient regarding Dupixent  PAP - he states he never called company because he did not have proof of income to provide. I revisited the conversation we had about calling [employer] directly to determine exactly what proof of income he can provide since he does not get paychecks or file tax return. I provided him with [employer] phone number again and advised him to reach out to discuss with case manager  Sherry Pennant, PharmD, MPH, BCPS, CPP Clinical Pharmacist (Rheumatology and Pulmonology)

## 2023-08-10 NOTE — Telephone Encounter (Signed)
 I called patient yesterday :  Spoke with patient regarding Dupixent  PAP - he states he never called company because he did not have proof of income to provide. I revisited the conversation we had about calling [employer] directly to determine exactly what proof of income he can provide since he does not get paychecks or file tax return. I provided him with [employer] phone number again and advised him to reach out to discuss with case manager  Sherry Pennant, PharmD, MPH, BCPS, CPP Clinical Pharmacist (Rheumatology and Pulmonology)

## 2023-08-17 NOTE — Telephone Encounter (Signed)
Patient dropped off letter from employer. Submitted to [employer]  Phone #: [phone] Fax #: [phone]   Shane Fleming, PharmD, MPH, BCPS, CPP Clinical Pharmacist (Rheumatology and Pulmonology)

## 2023-08-19 ENCOUNTER — Encounter: Payer: Self-pay | Admitting: Pulmonary Disease

## 2023-08-20 ENCOUNTER — Ambulatory Visit: Payer: Self-pay | Admitting: Nurse Practitioner

## 2023-09-07 ENCOUNTER — Ambulatory Visit: Payer: Self-pay | Admitting: Pulmonary Disease

## 2023-09-07 ENCOUNTER — Other Ambulatory Visit: Payer: Self-pay

## 2023-09-07 ENCOUNTER — Encounter: Payer: Self-pay | Admitting: Pulmonary Disease

## 2023-09-07 VITALS — BP 161/84 | HR 86 | Ht 71.0 in | Wt 153.6 lb

## 2023-09-07 DIAGNOSIS — J479 Bronchiectasis, uncomplicated: Secondary | ICD-10-CM

## 2023-09-07 MED ORDER — BUDESONIDE 0.5 MG/2ML IN SUSP
0.5000 mg | Freq: Two times a day (BID) | RESPIRATORY_TRACT | 11 refills | Status: DC
Start: 2023-09-07 — End: 2024-01-17
  Filled 2023-09-07 (×2): qty 60, 15d supply, fill #0
  Filled 2023-12-10: qty 120, 30d supply, fill #1

## 2023-09-07 MED ORDER — ALBUTEROL SULFATE HFA 108 (90 BASE) MCG/ACT IN AERS
2.0000 | INHALATION_SPRAY | Freq: Four times a day (QID) | RESPIRATORY_TRACT | 6 refills | Status: AC | PRN
  Filled 2023-09-07: qty 6.7, 25d supply, fill #0
  Filled 2023-10-08: qty 6.7, 25d supply, fill #1
  Filled 2023-11-06: qty 6.7, 25d supply, fill #2
  Filled 2023-12-10 (×2): qty 6.7, 25d supply, fill #3
  Filled 2024-01-16: qty 6.7, 25d supply, fill #4
  Filled 2024-03-17: qty 6.7, 25d supply, fill #5
  Filled 2024-06-13: qty 6.7, 25d supply, fill #6
  Filled 2024-07-22: qty 6.7, 25d supply, fill #7

## 2023-09-07 NOTE — Patient Instructions (Signed)
 Continue anoro 1 puff daily  Use albuterol inhaler 1-2 puffs every 4-6 hours as needed  Start budesonide nebs twice daily  We will work on getting your dupixent started again  Follow up in 4 months

## 2023-09-07 NOTE — Progress Notes (Signed)
 Synopsis: Referred in September 202 for bronchiectasis  Subjective:   PATIENT ID: Shane Fleming GENDER: male DOB: [birth year], MRN: [id number]  HPI  Chief Complaint  Patient presents with   Follow-up   [name] is a 53 year old male, never smoker with bronchiectasis who returns to pulmonary clinic for follow up for bronchiectasis and asthma.   He has been off Dupixent and is working on providing necessary documentation to facilitate the approval process. He has already submitted a letter from his workplace indicating his income, which is required for the approval.  He recently completed a course of prednisone and Levaquin, which improved his symptoms. He is currently using Anoro, an inhaler, and a flutter valve for his respiratory management. He also has a nebulizer machine at home, which he uses regularly.  He mentions that he prefers the current albuterol inhaler over a previous one, stating 'this one is better than the blue one.' He confirms that he has refills available for his inhaler.  OV 08/07/23 He has been experiencing worsening difficulty breathing due to a lack of medication. His inhaler is not effective, and he has not received his Dupixent injections for over three months. Previously, prednisone and Levaquin were effective in managing his symptoms, reducing cough, and improving appetite.  He has not received his Anoro inhaler for about a month and is no longer receiving Dupixent injections. Dupixent delivery stopped after questions related to his paycheck arose. He uses a nebulizer and inhaler but has not received a refill for the Anoro inhaler.  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 He mentions that the medication provided short-term relief but not prolonged relief.  No fevers, chills, or sweats, except when it is cold. He uses a flutter valve device every morning, which causes discomfort and does not help in  bringing up mucus or causing him to cough.  OV 05/08/23 He was treated with prednisone by his PCP in August for a breathing flare.   He presents with generalized malaise, increased cough, and mucus production. He reports feeling unwell daily, with body aches and sweats, even in cold weather. He describes his body as feeling 'hot' and has been experiencing what he believes to be fevers and chills. These symptoms have been ongoing for a 'long time,' but have worsened over the past three months since discontinuing Dupixent. Prior to stopping Dupixent, the patient reported improved breathing and increased activity, including returning to the mosque for prayer. However, since discontinuing the medication, he has noticed a decline in his overall health and well-being. He continues to use Anoro, which he reports as helpful. The patient also reports using a flutter valve daily and an albuterol nebulizer treatment.  OV 05/2022 Patient was started on dupixent 02/01/22. He reports his breathing is better. He is now going to the mosque for prayer as he was not able to do this before. He feels he could work a full 8 hour day as he feels stronger. He is coughing less, mainly in the morning times now. He is sleeping better at night as he is not coughing and wheezing as much.   He has not been able to get Anoro over the past 2 months. He has been on patient assistance for this in the past.   OV 12/21/21 He was referred to Allergy Immunology at last visit for evaluation of abnormal immunoglobulin levels with elevated IgG. He had allergy testing performed with elevated IgE levels to  dust mite, dog, grass, mold, tree, weed. He had low titers to strep pneumoniae and tetanus. He was recommended to get updated vaccines and plan for repeated titers. He was recommended to resume dulera 100-27mcg 2 puffs twice daily along with his anoro 1 puff daily. He was recommended to start  singulair 10mg  daily and zyrtec 10mg  daily for  allergies.   He has been spending $500 on anoro the last couple of months as he does not have insurance coverage. He has not started dulera, singulair or zyrtec due to affordability.   His breathing as been stable due to it being summer time.   OV 09/20/21 He reports persistent dyspnea and cough. He is not coughing any phlegm up He reports some improvement in his symptoms since switching from dulera to stiolto. He reports decreased appetite over the past couple of weeks. He does have wheezing. He is having a harder time with his work due to the dyspnea.   PFTs today show mixed obstructive and restrictive defects. No significant bronchodilator response.   OV 06/20/2021 He has done well since last visit on Dulera 100-5 MCG 2 puffs twice daily along with initiation of pantoprazole 40 mg daily for GERD.  He has also been using albuterol nebulizer treatments followed by flutter valve therapy. He reports since the weather has changed he has increased cough and shortness of breath.  He reports a slight increase in sputum production since his cough increased.  OV 03/23/21 Sputum culture was positive for Klebsiella pneumonia from expectorated sputum sample.  He completed 14 days of fluoroquinolone therapy and is feeling much better.  His fungal and AFB cultures show no growth to date from a bronchoscopy with BAL on 02/17/2021.  CT chest scan from 02/11/2021 shows diffuse bronchial wall thickening and bilateral lower lobe predominant cylindrical bronchiectasis with mucoid impaction of the right lower airways.  He has multiple scattered pulmonary nodules measuring 5 to 9 mm in size.  He was discharged on home oxygen which she has stopped using over the past 1 week.  He was seen by his primary care provider 03/16/2021, note personally reviewed, and he was provided [facility] inhaler 100-5 MCG 2 puffs twice daily along with as needed albuterol inhaler and nebulizer solution.  He does have a nebulizer machine at home  along with flutter valve.  He is finishing a course of prednisone from his primary care.  He denies any issues with seasonal allergies.  He reports the cold air does bother his breathing in the wintertime.  He is a never smoker but reports secondhand smoke exposure from friends and relatives while growing up.  He is originally from [place].  He denies any significant exposure to charcoal or wood-burning stove's while growing up.  He denies any respiratory issues as a kid or in his younger years.  He reports he is only had a cough over the last 4 years.  Laboratory test reviewed from his hospitalization and they are negative for ANA, rheumatoid factor and ANCA testing.  IgG level is increased at 2000 464 and IgG4 subclass elevated at 106.  IgE level 252.  Past Medical History:  Diagnosis Date   Asthma    Prediabetes    Sinusitis 2018     Family History  Problem Relation Age of Onset   Asthma Father      Social History   Socioeconomic History   Marital status: Single    Spouse name: Not on file   Number of children: Not on  file   Years of education: Not on file   Highest education level: Not on file  Occupational History   Not on file  Tobacco Use   Smoking status: Never    Passive exposure: Never   Smokeless tobacco: Never  Vaping Use   Vaping status: Never Used  Substance and Sexual Activity   Alcohol use: No   Drug use: No   Sexual activity: Not Currently  Other Topics Concern   Not on file  Social History Narrative   Not on file   Social Drivers of Health   Financial Resource Strain: Not on file  Food Insecurity: Not on file  Transportation Needs: Not on file  Physical Activity: Not on file  Stress: Not on file  Social Connections: Not on file  Intimate Partner Violence: Not on file     No Known Allergies   Outpatient Medications Prior to Visit  Medication Sig Dispense Refill   albuterol (PROVENTIL) (2.5 MG/3ML) 0.083% nebulizer solution Inhale 3 mLs into  the lungs every 4 (four) hours as needed for wheezing or shortness of breath. 75 mL 12   Dupilumab (DUPIXENT) 300 MG/2ML SOPN Inject 300 mg into the skin every 14 (fourteen) days. 12 mL 1   levofloxacin (LEVAQUIN) 750 MG tablet Take 1 tablet (750 mg total) by mouth daily. 14 tablet 0   montelukast (SINGULAIR) 10 MG tablet Take 1 tablet (10 mg total) by mouth at bedtime. 90 tablet 3   umeclidinium-vilanterol (ANORO ELLIPTA) 62.5-25 MCG/ACT AEPB Inhale 1 puff into the lungs daily. NEEDS PASS 180 each 1   albuterol (VENTOLIN HFA) 108 (90 Base) MCG/ACT inhaler Inhale 2 puffs into the lungs every 6 (six) hours as needed for wheezing or shortness of breath (when travelling). 20.1 g 3   No facility-administered medications prior to visit.    Review of Systems  Constitutional:  Negative for chills, fever, malaise/fatigue and weight loss.  HENT:  Negative for congestion, sinus pain and sore throat.   Eyes: Negative.   Respiratory:  Positive for shortness of breath. Negative for cough, hemoptysis, sputum production and wheezing.   Cardiovascular:  Negative for chest pain, palpitations, orthopnea, claudication and leg swelling.  Gastrointestinal:  Negative for abdominal pain, heartburn, nausea and vomiting.  Genitourinary: Negative.   Musculoskeletal:  Negative for joint pain and myalgias.  Skin:  Negative for rash.  Neurological:  Negative for weakness.  Endo/Heme/Allergies: Negative.   Psychiatric/Behavioral: Negative.      Objective:   Vitals:   09/07/23 1053  BP: (!) 161/84  Pulse: 86  SpO2: 92%  Weight: 153 lb 9.6 oz (69.7 kg)  Height: 5\' 11"  (1.803 m)   Physical Exam Constitutional:      General: He is not in acute distress. HENT:     Head: Normocephalic and atraumatic.  Eyes:     Conjunctiva/sclera: Conjunctivae normal.  Cardiovascular:     Rate and Rhythm: Normal rate and regular rhythm.     Pulses: Normal pulses.     Heart sounds: Normal heart sounds. No murmur  heard. Pulmonary:     Effort: Pulmonary effort is normal.     Breath sounds: No decreased air movement. No wheezing, rhonchi or rales.  Musculoskeletal:     Right lower leg: No edema.     Left lower leg: No edema.  Skin:    General: Skin is warm and dry.  Neurological:     General: No focal deficit present.     Mental Status: He is alert.  CBC    Component Value Date/Time   WBC 4.7 02/16/2023 1413   WBC 6.9 09/20/2021 1128   RBC 5.28 02/16/2023 1413   RBC 4.92 09/20/2021 1128   HGB 14.8 02/16/2023 1413   HCT 45.8 02/16/2023 1413   PLT 301 02/16/2023 1413   MCV 87 02/16/2023 1413   MCH 28.0 02/16/2023 1413   MCH 28.6 02/18/2021 0101   MCHC 32.3 02/16/2023 1413   MCHC 32.6 09/20/2021 1128   RDW 14.5 02/16/2023 1413   LYMPHSABS 2.2 02/16/2023 1413   MONOABS 0.6 09/20/2021 1128   EOSABS 0.2 02/16/2023 1413   BASOSABS 0.0 02/16/2023 1413      Latest Ref Rng & Units 02/16/2023    2:13 PM 08/18/2022   10:42 AM 02/15/2022   11:49 AM  BMP  Glucose 70 - 99 mg/dL 409  811  914   BUN 6 - 24 mg/dL 24  16  16    Creatinine 0.76 - 1.27 mg/dL 7.82  9.56  2.13   BUN/Creat Ratio 9 - 20 19  15  13    Sodium 134 - 144 mmol/L 141  140  142   Potassium 3.5 - 5.2 mmol/L 4.1  4.8  4.2   Chloride 96 - 106 mmol/L 101  100  104   CO2 20 - 29 mmol/L 25  26  23    Calcium 8.7 - 10.2 mg/dL 08.6  9.6  57.8    Chest imaging: CT Chest 06/15/21 1. Diffuse bilateral centrilobular nodules, some with tree-in-bud configuration are again seen with mild lower lung predominant cylindrical bronchiectasis, overall improved, however, there are some new areas of nodularity. Findings are likely due to chronic atypical infection, diffuse panbronchiolitis is an additional consideration given history of sinusitis. 2. Interval resolution of bilateral lower lobe mucoid impaction. 3. Largest new solid nodule measures 7 mm in mean diameter. Non-contrast chest CT at 6-12 months is recommended. If the nodule is  stable at time of repeat CT, then future CT at 18-24 months (from today's scan) is considered optional for low-risk patients, but is recommended for high-risk patients. This recommendation follows the consensus statement: Guidelines for Management of Incidental Pulmonary Nodules Detected on CT Images: From the Fleischner Society 2017; Radiology 2017; 284:228-243. 4. Mildly patulous esophagus, finding can be seen in the setting of esophageal dysmotility.  CT chest 02/11/2021  diffuse bronchial wall thickening and bilateral lower lobe predominant cylindrical bronchiectasis with mucoid impaction of the right lower airways.  He has multiple scattered pulmonary nodules measuring 5 to 9 mm in size.  PFT:    Latest Ref Rng & Units 09/20/2021    9:56 AM  PFT Results  FVC-Pre L 2.26   FVC-Predicted Pre % 49   FVC-Post L 2.32   FVC-Predicted Post % 50   Pre FEV1/FVC % % 48   Post FEV1/FCV % % 50   FEV1-Pre L 1.09   FEV1-Predicted Pre % 29   FEV1-Post L 1.17   TLC L 5.63   TLC % Predicted % 74   RV % Predicted % 107     Echo 02/16/21: LV EF 60-65%. RV normal function and size.   Labs: Juanetta Gosling 02/14/21 negative  Assessment & Plan:   Bronchiectasis without complication (HCC) - Plan: budesonide (PULMICORT) 0.5 MG/2ML nebulizer solution, albuterol (VENTOLIN HFA) 108 (90 Base) MCG/ACT inhaler  Discussion: Shane Fleming is a 53 year old male, never smoker with bronchiectasis who returns to pulmonary clinic for follow up for bronchiectasis and asthma-COPD overlap syndrome.  Asthma-COPD overlap Syndrome Bronchiectasis Symptoms controlled at this time after course of prednisone and levaquin. Working on Scientist, research (physical sciences) covered again. -Will work with pharmacy team about getting Dupixent covered and refilled again. He has brought in document stating his income. -Continue nebulizer treatments in the morning and evening. -Continue use of flutter valve. -Continue anoro inhaler 1 puff  daily  Follow up in 4 months.   Melody Comas, MD [facility] Office: [phone]    Current Outpatient Medications:    albuterol (PROVENTIL) (2.5 MG/3ML) 0.083% nebulizer solution, Inhale 3 mLs into the lungs every 4 (four) hours as needed for wheezing or shortness of breath., Disp: 75 mL, Rfl: 12   budesonide (PULMICORT) 0.5 MG/2ML nebulizer solution, Take 2 mLs (0.5 mg total) by nebulization 2 (two) times daily., Disp: 120 mL, Rfl: 11   Dupilumab (DUPIXENT) 300 MG/2ML SOPN, Inject 300 mg into the skin every 14 (fourteen) days., Disp: 12 mL, Rfl: 1   levofloxacin (LEVAQUIN) 750 MG tablet, Take 1 tablet (750 mg total) by mouth daily., Disp: 14 tablet, Rfl: 0   montelukast (SINGULAIR) 10 MG tablet, Take 1 tablet (10 mg total) by mouth at bedtime., Disp: 90 tablet, Rfl: 3   umeclidinium-vilanterol (ANORO ELLIPTA) 62.5-25 MCG/ACT AEPB, Inhale 1 puff into the lungs daily. NEEDS PASS, Disp: 180 each, Rfl: 1   albuterol (VENTOLIN HFA) 108 (90 Base) MCG/ACT inhaler, Inhale 2 puffs into the lungs every 6 (six) hours as needed for wheezing or shortness of breath (when travelling)., Disp: 20.1 g, Rfl: 6

## 2023-09-08 ENCOUNTER — Encounter: Payer: Self-pay | Admitting: Pulmonary Disease

## 2023-09-12 ENCOUNTER — Other Ambulatory Visit: Payer: Self-pay

## 2023-09-19 ENCOUNTER — Ambulatory Visit: Payer: Self-pay | Attending: Nurse Practitioner | Admitting: Nurse Practitioner

## 2023-09-19 ENCOUNTER — Encounter: Payer: Self-pay | Admitting: Nurse Practitioner

## 2023-09-19 ENCOUNTER — Other Ambulatory Visit: Payer: Self-pay

## 2023-09-19 VITALS — BP 100/66 | HR 110 | Temp 98.4°F | Resp 19 | Ht 71.0 in | Wt 142.6 lb

## 2023-09-19 DIAGNOSIS — J471 Bronchiectasis with (acute) exacerbation: Secondary | ICD-10-CM

## 2023-09-19 DIAGNOSIS — B349 Viral infection, unspecified: Secondary | ICD-10-CM

## 2023-09-19 DIAGNOSIS — Z23 Encounter for immunization: Secondary | ICD-10-CM

## 2023-09-19 DIAGNOSIS — R7303 Prediabetes: Secondary | ICD-10-CM

## 2023-09-19 LAB — POCT GLYCOSYLATED HEMOGLOBIN (HGB A1C): HbA1c, POC (controlled diabetic range): 6.3 % (ref 0.0–7.0)

## 2023-09-19 MED ORDER — PREDNISONE 10 MG PO TABS
ORAL_TABLET | ORAL | 0 refills | Status: DC
  Filled 2023-09-19: qty 30, 12d supply, fill #0

## 2023-09-19 NOTE — Progress Notes (Addendum)
 Assessment & Plan:  Shane Fleming was seen today for medical management of chronic issues.  Diagnoses and all orders for this visit:  Prediabetes -     POCT glycosylated hemoglobin (Hb A1C) -     CMP14+EGFR  Need for influenza vaccination -     Flu vaccine trivalent PF, 6mos and older(Flulaval,Afluria,Fluarix,Fluzone)  Viral illness -     CBC with Differential  Bronchiectasis with acute exacerbation (HCC) -     predniSONE (DELTASONE) 10 MG tablet; Take 4 tablets (40 mg total) by mouth daily with breakfast for 3 days, THEN 3 tablets (30 mg total) daily with breakfast for 3 days, THEN 2 tablets (20 mg total) daily with breakfast for 3 days, THEN 1 tablet (10 mg total) daily with breakfast for 3 days.    Patient has been counseled on age-appropriate routine health concerns for screening and prevention. These are reviewed and up-to-date. Referrals have been placed accordingly. Immunizations are up-to-date or declined.    Subjective:   Chief Complaint  Patient presents with   Medical Management of Chronic Issues    Shane Fleming 53 y.o. male presents to office today for follow up to prediabetes  He has PMH of bronchiectasis and asthma for which he is being seen by Pulmonology.    A1c currently at goal. Lab Results  Component Value Date   HGBA1C 6.3 09/19/2023    States a few weeks ago he had pain all over his body. Shortness of breath with wheezing and cough. Today he states he feel somewhat better but still with wheezing and shortness of breath despite using nebulizers, anoro and SABA   Review of Systems  Constitutional:  Negative for fever, malaise/fatigue and weight loss.  HENT: Negative.  Negative for nosebleeds.   Eyes: Negative.  Negative for blurred vision, double vision and photophobia.  Respiratory:  Positive for cough, sputum production, shortness of breath and wheezing.   Cardiovascular: Negative.  Negative for chest pain, palpitations and leg swelling.   Gastrointestinal: Negative.  Negative for heartburn, nausea and vomiting.  Musculoskeletal: Negative.  Negative for myalgias.  Neurological: Negative.  Negative for dizziness, focal weakness, seizures and headaches.  Psychiatric/Behavioral: Negative.  Negative for suicidal ideas.     Past Medical History:  Diagnosis Date   Asthma    Prediabetes    Sinusitis 2018    Past Surgical History:  Procedure Laterality Date   APPENDECTOMY     BRONCHIAL WASHINGS  02/17/2021   Procedure: BRONCHIAL WASHINGS;  Surgeon: Martina Sinner, MD;  Location: [facility] ENDOSCOPY;  Service: Pulmonary;;   LAPAROSCOPIC APPENDECTOMY N/A 04/08/2015   Procedure: APPENDECTOMY LAPAROSCOPIC;  Surgeon: Glenna Fellows, MD;  Location: [facility] ORS;  Service: General;  Laterality: N/A;   VIDEO BRONCHOSCOPY N/A 02/17/2021   Procedure: VIDEO BRONCHOSCOPY WITHOUT FLUORO;  Surgeon: Martina Sinner, MD;  Location: [facility] ENDOSCOPY;  Service: Pulmonary;  Laterality: N/A;    Family History  Problem Relation Age of Onset   Asthma Father     Social History Reviewed with no changes to be made today.   Outpatient Medications Prior to Visit  Medication Sig Dispense Refill   albuterol (PROVENTIL) (2.5 MG/3ML) 0.083% nebulizer solution Inhale 3 mLs into the lungs every 4 (four) hours as needed for wheezing or shortness of breath. 75 mL 12   albuterol (VENTOLIN HFA) 108 (90 Base) MCG/ACT inhaler Inhale 2 puffs into the lungs every 6 (six) hours as needed for wheezing or shortness of breath (when travelling). 20.1 g 6  budesonide (PULMICORT) 0.5 MG/2ML nebulizer solution Take 2 mLs (0.5 mg total) by nebulization 2 (two) times daily. 120 mL 11   levofloxacin (LEVAQUIN) 750 MG tablet Take 1 tablet (750 mg total) by mouth daily. 14 tablet 0   montelukast (SINGULAIR) 10 MG tablet Take 1 tablet (10 mg total) by mouth at bedtime. 90 tablet 3   umeclidinium-vilanterol (ANORO ELLIPTA) 62.5-25 MCG/ACT AEPB Inhale 1 puff into the lungs daily.  NEEDS PASS 180 each 1   Dupilumab (DUPIXENT) 300 MG/2ML SOPN Inject 300 mg into the skin every 14 (fourteen) days. (Patient not taking: Reported on 09/19/2023) 12 mL 1   No facility-administered medications prior to visit.    No Known Allergies     Objective:    BP 100/66 (BP Location: Left Arm, Patient Position: Sitting, Cuff Size: Normal)   Pulse (!) 110   Resp 19   Ht 5\' 11"  (1.803 m)   SpO2 93%   BMI 21.42 kg/m  Wt Readings from Last 3 Encounters:  09/07/23 153 lb 9.6 oz (69.7 kg)  08/07/23 145 lb 6.4 oz (66 kg)  05/08/23 143 lb 3.2 oz (65 kg)    Physical Exam Vitals and nursing note reviewed.  Constitutional:      Appearance: He is well-developed.  HENT:     Head: Normocephalic and atraumatic.  Cardiovascular:     Rate and Rhythm: Regular rhythm. Tachycardia present.     Heart sounds: Normal heart sounds. No murmur heard.    No friction rub. No gallop.  Pulmonary:     Effort: Pulmonary effort is normal. No tachypnea or respiratory distress.     Breath sounds: Decreased air movement present. Examination of the right-upper field reveals wheezing. Examination of the left-upper field reveals wheezing. Examination of the right-middle field reveals wheezing. Examination of the left-middle field reveals wheezing. Examination of the right-lower field reveals wheezing. Examination of the left-lower field reveals wheezing. Wheezing present. No decreased breath sounds, rhonchi or rales.  Chest:     Chest wall: No tenderness.  Abdominal:     General: Bowel sounds are normal.     Palpations: Abdomen is soft.  Musculoskeletal:        General: Normal range of motion.     Cervical back: Normal range of motion.  Skin:    General: Skin is warm and dry.  Neurological:     Mental Status: He is alert and oriented to person, place, and time.     Coordination: Coordination normal.  Psychiatric:        Behavior: Behavior normal. Behavior is cooperative.        Thought Content: Thought  content normal.        Judgment: Judgment normal.          Patient has been counseled extensively about nutrition and exercise as well as the importance of adherence with medications and regular follow-up. The patient was given clear instructions to go to ER or return to medical center if symptoms don't improve, worsen or new problems develop. The patient verbalized understanding.   Follow-up: Return in about 3 months (around 12/20/2023).   Claiborne Rigg, FNP-BC [facility] and [facility] [place], [place] [phone]   09/19/2023, 9:52 AM

## 2023-09-20 LAB — CBC WITH DIFFERENTIAL/PLATELET
Basophils Absolute: 0 10*3/uL (ref 0.0–0.2)
Basos: 0 %
EOS (ABSOLUTE): 0.1 10*3/uL (ref 0.0–0.4)
Eos: 2 %
Hematocrit: 49.2 % (ref 37.5–51.0)
Hemoglobin: 16.7 g/dL (ref 13.0–17.7)
Immature Grans (Abs): 0 10*3/uL (ref 0.0–0.1)
Immature Granulocytes: 0 %
Lymphocytes Absolute: 3 10*3/uL (ref 0.7–3.1)
Lymphs: 55 %
MCH: 30.3 pg (ref 26.6–33.0)
MCHC: 33.9 g/dL (ref 31.5–35.7)
MCV: 89 fL (ref 79–97)
Monocytes Absolute: 0.6 10*3/uL (ref 0.1–0.9)
Monocytes: 10 %
Neutrophils Absolute: 1.9 10*3/uL (ref 1.4–7.0)
Neutrophils: 33 %
Platelets: 363 10*3/uL (ref 150–450)
RBC: 5.51 x10E6/uL (ref 4.14–5.80)
RDW: 13.1 % (ref 11.6–15.4)
WBC: 5.6 10*3/uL (ref 3.4–10.8)

## 2023-09-20 LAB — CMP14+EGFR
ALT: 40 IU/L (ref 0–44)
AST: 32 IU/L (ref 0–40)
Albumin: 4.1 g/dL (ref 3.8–4.9)
Alkaline Phosphatase: 68 IU/L (ref 44–121)
BUN/Creatinine Ratio: 15 (ref 9–20)
BUN: 19 mg/dL (ref 6–24)
Bilirubin Total: 0.4 mg/dL (ref 0.0–1.2)
CO2: 22 mmol/L (ref 20–29)
Calcium: 9.4 mg/dL (ref 8.7–10.2)
Chloride: 100 mmol/L (ref 96–106)
Creatinine, Ser: 1.31 mg/dL — ABNORMAL HIGH (ref 0.76–1.27)
Globulin, Total: 3.3 g/dL (ref 1.5–4.5)
Glucose: 110 mg/dL — ABNORMAL HIGH (ref 70–99)
Potassium: 4.6 mmol/L (ref 3.5–5.2)
Sodium: 138 mmol/L (ref 134–144)
Total Protein: 7.4 g/dL (ref 6.0–8.5)
eGFR: 64 mL/min/{1.73_m2} (ref >59)

## 2023-10-08 ENCOUNTER — Other Ambulatory Visit: Payer: Self-pay

## 2023-11-06 ENCOUNTER — Encounter: Payer: Self-pay | Admitting: Pulmonary Disease

## 2023-11-06 ENCOUNTER — Other Ambulatory Visit (HOSPITAL_COMMUNITY): Payer: Self-pay

## 2023-11-06 ENCOUNTER — Telehealth: Payer: Self-pay

## 2023-11-06 ENCOUNTER — Ambulatory Visit: Payer: Self-pay | Admitting: Pulmonary Disease

## 2023-11-06 ENCOUNTER — Other Ambulatory Visit: Payer: Self-pay

## 2023-11-06 DIAGNOSIS — J4489 Other specified chronic obstructive pulmonary disease: Secondary | ICD-10-CM

## 2023-11-06 DIAGNOSIS — J470 Bronchiectasis with acute lower respiratory infection: Secondary | ICD-10-CM

## 2023-11-06 MED ORDER — PREDNISONE 10 MG PO TABS
ORAL_TABLET | ORAL | 0 refills | Status: AC
Start: 2023-11-06 — End: 2023-11-18
  Filled 2023-11-06: qty 30, 12d supply, fill #0

## 2023-11-06 MED ORDER — METHYLPREDNISOLONE ACETATE 80 MG/ML IJ SUSP
80.0000 mg | Freq: Once | INTRAMUSCULAR | Status: AC
  Administered 2023-11-06: 80 mg via INTRAMUSCULAR

## 2023-11-06 MED ORDER — ANORO ELLIPTA 62.5-25 MCG/ACT IN AEPB
1.0000 | INHALATION_SPRAY | Freq: Every day | RESPIRATORY_TRACT | 1 refills | Status: DC
  Filled 2023-11-06: qty 180, 180d supply, fill #0
  Filled 2023-12-18: qty 60, 30d supply, fill #0

## 2023-11-06 NOTE — Patient Instructions (Addendum)
 Use anoro ellipta  1 puff daily  Use albuterol  inhaler or nebulizer treatments every 4-6 hours as needed  Use budesonide  nebulizer twice daily as needed  Start prednisone  taper 40mg  daily x 3 days 30mg  daily x 3 days 20mg  daily x 3 days 10mg  daily x 3 days  We are working with pharmacy about your dupixent  prescription  Follow up in 2 months, call sooner if needed

## 2023-11-06 NOTE — Progress Notes (Signed)
 Synopsis: Referred in September 202 for bronchiectasis  Subjective:   PATIENT ID: Shane Fleming GENDER: male DOB: 1971/05/04, MRN: [id number]  HPI  Chief Complaint  Patient presents with   Follow-up    Pt state SOB, CC   Shane Fleming is a 53 year old male, never smoker with bronchiectasis who returns to pulmonary clinic for follow up for bronchiectasis and asthma.   He is having trouble with his breathing today. He is having cough and wheezing and significant dyspnea. He reports not being able to get Anoro ellipta . He has not been restarted on dupixent  either. He has been using budesonide  nebs and albuterol  nebs as needed.  OV 09/07/23 He has been off Dupixent  and is working on providing necessary documentation to facilitate the approval process. He has already submitted a letter from his workplace indicating his income, which is required for the approval.  He recently completed a course of prednisone  and Levaquin , which improved his symptoms. He is currently using Anoro, an inhaler, and a flutter valve for his respiratory management. He also has a nebulizer machine at home, which he uses regularly.  He mentions that he prefers the current albuterol  inhaler over a previous one, stating 'this one is better than the blue one.' He confirms that he has refills available for his inhaler.  OV 08/07/23 He has been experiencing worsening difficulty breathing due to a lack of medication. His inhaler is not effective, and he has not received his Dupixent  injections for over three months. Previously, prednisone  and Levaquin  were effective in managing his symptoms, reducing cough, and improving appetite.  He has not received his Anoro inhaler for about a month and is no longer receiving Dupixent  injections. Dupixent  delivery stopped after questions related to his paycheck arose. He uses a nebulizer and inhaler but has not received a refill for the Anoro inhaler.  His cough and breathing issues  have worsened, affecting his ability to work and attend the mosque. He mentions that the medication provided short-term relief but not prolonged relief.  No fevers, chills, or sweats, except when it is cold. He uses a flutter valve device every morning, which causes discomfort and does not help in bringing up mucus or causing him to cough.  OV 05/08/23 He was treated with prednisone  by his PCP in August for a breathing flare.   He presents with generalized malaise, increased cough, and mucus production. He reports feeling unwell daily, with body aches and sweats, even in cold weather. He describes his body as feeling 'hot' and has been experiencing what he believes to be fevers and chills. These symptoms have been ongoing for a 'long time,' but have worsened over the past three months since discontinuing Dupixent . Prior to stopping Dupixent , the patient reported improved breathing and increased activity, including returning to the mosque for prayer. However, since discontinuing the medication, he has noticed a decline in his overall health and well-being. He continues to use Anoro, which he reports as helpful. The patient also reports using a flutter valve daily and an albuterol  nebulizer treatment.  OV 05/2022 Patient was started on dupixent  02/01/22. He reports his breathing is better. He is now going to the mosque for prayer as he was not able to do this before. He feels he could work a full 8 hour day as he feels stronger. He is coughing less, mainly in the morning times now. He is sleeping better at night as he is not coughing and wheezing as much.   He  has not been able to get Anoro over the past 2 months. He has been on patient assistance for this in the past.   OV 12/21/21 He was referred to Allergy  Immunology at last visit for evaluation of abnormal immunoglobulin levels with elevated IgG. He had allergy  testing performed with elevated IgE levels to dust mite, dog, grass, mold, tree, weed. He had  low titers to strep pneumoniae and tetanus. He was recommended to get updated vaccines and plan for repeated titers. He was recommended to resume dulera  100-5mcg 2 puffs twice daily along with his anoro 1 puff daily. He was recommended to start  singulair  10mg  daily and zyrtec 10mg  daily for allergies.   He has been spending $500 on anoro the last couple of months as he does not have insurance coverage. He has not started dulera , singulair  or zyrtec due to affordability.   His breathing as been stable due to it being summer time.   OV 09/20/21 He reports persistent dyspnea and cough. He is not coughing any phlegm up He reports some improvement in his symptoms since switching from dulera  to stiolto. He reports decreased appetite over the past couple of weeks. He does have wheezing. He is having a harder time with his work due to the dyspnea.   PFTs today show mixed obstructive and restrictive defects. No significant bronchodilator response.   OV 06/20/2021 He has done well since last visit on Dulera  100-5 MCG 2 puffs twice daily along with initiation of pantoprazole  40 mg daily for GERD.  He has also been using albuterol  nebulizer treatments followed by flutter valve therapy. He reports since the weather has changed he has increased cough and shortness of breath.  He reports a slight increase in sputum production since his cough increased.  OV 03/23/21 Sputum culture was positive for Klebsiella pneumonia from expectorated sputum sample.  He completed 14 days of fluoroquinolone therapy and is feeling much better.  His fungal and AFB cultures show no growth to date from a bronchoscopy with BAL on 02/17/2021.  CT chest scan from 02/11/2021 shows diffuse bronchial wall thickening and bilateral lower lobe predominant cylindrical bronchiectasis with mucoid impaction of the right lower airways.  He has multiple scattered pulmonary nodules measuring 5 to 9 mm in size.  He was discharged on home oxygen which  she has stopped using over the past 1 week.  He was seen by his primary care provider 03/16/2021, note personally reviewed, and he was provided Dulera  inhaler 100-5 MCG 2 puffs twice daily along with as needed albuterol  inhaler and nebulizer solution.  He does have a nebulizer machine at home along with flutter valve.  He is finishing a course of prednisone  from his primary care.  He denies any issues with seasonal allergies.  He reports the cold air does bother his breathing in the wintertime.  He is a never smoker but reports secondhand smoke exposure from friends and relatives while growing up.  He is originally from [place].  He denies any significant exposure to charcoal or wood-burning stove's while growing up.  He denies any respiratory issues as a kid or in his younger years.  He reports he is only had a cough over the last 4 years.  Laboratory test reviewed from his hospitalization and they are negative for ANA, rheumatoid factor and ANCA testing.  IgG level is increased at 2000 464 and IgG4 subclass elevated at 106.  IgE level 252.  Past Medical History:  Diagnosis Date   Asthma  Prediabetes    Sinusitis 2018     Family History  Problem Relation Age of Onset   Asthma Father      Social History   Socioeconomic History   Marital status: Single    Spouse name: Not on file   Number of children: Not on file   Years of education: Not on file   Highest education level: Not on file  Occupational History   Not on file  Tobacco Use   Smoking status: Never    Passive exposure: Never   Smokeless tobacco: Never  Vaping Use   Vaping status: Never Used  Substance and Sexual Activity   Alcohol use: No   Drug use: No   Sexual activity: Not Currently  Other Topics Concern   Not on file  Social History Narrative   Not on file   Social Drivers of Health   Financial Resource Strain: Not on file  Food Insecurity: Not on file  Transportation Needs: Not on file  Physical Activity:  Not on file  Stress: Not on file  Social Connections: Not on file  Intimate Partner Violence: Not on file     No Known Allergies   Outpatient Medications Prior to Visit  Medication Sig Dispense Refill   albuterol  (PROVENTIL ) (2.5 MG/3ML) 0.083% nebulizer solution Inhale 3 mLs into the lungs every 4 (four) hours as needed for wheezing or shortness of breath. 75 mL 12   albuterol  (VENTOLIN  HFA) 108 (90 Base) MCG/ACT inhaler Inhale 2 puffs into the lungs every 6 (six) hours as needed for wheezing or shortness of breath (when travelling). 20.1 g 6   budesonide  (PULMICORT ) 0.5 MG/2ML nebulizer solution Take 2 mLs (0.5 mg total) by nebulization 2 (two) times daily. 120 mL 11   Dupilumab  (DUPIXENT ) 300 MG/2ML SOPN Inject 300 mg into the skin every 14 (fourteen) days. 12 mL 1   montelukast  (SINGULAIR ) 10 MG tablet Take 1 tablet (10 mg total) by mouth at bedtime. 90 tablet 3   levofloxacin  (LEVAQUIN ) 750 MG tablet Take 1 tablet (750 mg total) by mouth daily. 14 tablet 0   predniSONE  (DELTASONE ) 10 MG tablet Take 4 tablets (40 mg total) by mouth daily with breakfast for 3 days, THEN 3 tablets (30 mg total) daily with breakfast for 3 days, THEN 2 tablets (20 mg total) daily with breakfast for 3 days, THEN 1 tablet (10 mg total) daily with breakfast for 3 days. 30 tablet 0   umeclidinium-vilanterol (ANORO ELLIPTA ) 62.5-25 MCG/ACT AEPB Inhale 1 puff into the lungs daily. NEEDS PASS 180 each 1   No facility-administered medications prior to visit.    Review of Systems  Constitutional:  Negative for chills, fever, malaise/fatigue and weight loss.  HENT:  Negative for congestion, sinus pain and sore throat.   Eyes: Negative.   Respiratory:  Positive for cough, sputum production, shortness of breath and wheezing. Negative for hemoptysis.   Cardiovascular:  Negative for chest pain, palpitations, orthopnea, claudication and leg swelling.  Gastrointestinal:  Negative for abdominal pain, heartburn, nausea and  vomiting.  Genitourinary: Negative.   Musculoskeletal:  Negative for joint pain and myalgias.  Skin:  Negative for rash.  Neurological:  Negative for weakness.  Endo/Heme/Allergies: Negative.   Psychiatric/Behavioral: Negative.      Objective:   Vitals:   11/06/23 0949  BP: 130/62  SpO2: 96%  Weight: 150 lb (68 kg)  Height: 5\' 11"  (1.803 m)    Physical Exam Constitutional:      General: He is  not in acute distress. HENT:     Head: Normocephalic and atraumatic.  Eyes:     Conjunctiva/sclera: Conjunctivae normal.  Cardiovascular:     Rate and Rhythm: Normal rate and regular rhythm.     Pulses: Normal pulses.     Heart sounds: Normal heart sounds. No murmur heard. Pulmonary:     Effort: Pulmonary effort is normal.     Breath sounds: No decreased air movement. Wheezing and rhonchi present. No rales.  Musculoskeletal:     Right lower leg: No edema.     Left lower leg: No edema.  Neurological:     Mental Status: He is alert.    CBC    Component Value Date/Time   WBC 5.6 09/19/2023 1029   WBC 6.9 09/20/2021 1128   RBC 5.51 09/19/2023 1029   RBC 4.92 09/20/2021 1128   HGB 16.7 09/19/2023 1029   HCT 49.2 09/19/2023 1029   PLT 363 09/19/2023 1029   MCV 89 09/19/2023 1029   MCH 30.3 09/19/2023 1029   MCH 28.6 02/18/2021 0101   MCHC 33.9 09/19/2023 1029   MCHC 32.6 09/20/2021 1128   RDW 13.1 09/19/2023 1029   LYMPHSABS 3.0 09/19/2023 1029   MONOABS 0.6 09/20/2021 1128   EOSABS 0.1 09/19/2023 1029   BASOSABS 0.0 09/19/2023 1029      Latest Ref Rng & Units 09/19/2023   10:29 AM 02/16/2023    2:13 PM 08/18/2022   10:42 AM  BMP  Glucose 70 - 99 mg/dL 295  621  308   BUN 6 - 24 mg/dL 19  24  16    Creatinine 0.76 - 1.27 mg/dL 6.57  8.46  9.62   BUN/Creat Ratio 9 - 20 15  19  15    Sodium 134 - 144 mmol/L 138  141  140   Potassium 3.5 - 5.2 mmol/L 4.6  4.1  4.8   Chloride 96 - 106 mmol/L 100  101  100   CO2 20 - 29 mmol/L 22  25  26    Calcium 8.7 - 10.2 mg/dL 9.4   95.2  9.6    Chest imaging: CT Chest 06/15/21 1. Diffuse bilateral centrilobular nodules, some with tree-in-bud configuration are again seen with mild lower lung predominant cylindrical bronchiectasis, overall improved, however, there are some new areas of nodularity. Findings are likely due to chronic atypical infection, diffuse panbronchiolitis is an additional consideration given history of sinusitis. 2. Interval resolution of bilateral lower lobe mucoid impaction. 3. Largest new solid nodule measures 7 mm in mean diameter. Non-contrast chest CT at 6-12 months is recommended. If the nodule is stable at time of repeat CT, then future CT at 18-24 months (from today's scan) is considered optional for low-risk patients, but is recommended for high-risk patients. This recommendation follows the consensus statement: Guidelines for Management of Incidental Pulmonary Nodules Detected on CT Images: From the Fleischner Society 2017; Radiology 2017; 284:228-243. 4. Mildly patulous esophagus, finding can be seen in the setting of esophageal dysmotility.  CT chest 02/11/2021  diffuse bronchial wall thickening and bilateral lower lobe predominant cylindrical bronchiectasis with mucoid impaction of the right lower airways.  He has multiple scattered pulmonary nodules measuring 5 to 9 mm in size.  PFT:    Latest Ref Rng & Units 09/20/2021    9:56 AM  PFT Results  FVC-Pre L 2.26   FVC-Predicted Pre % 49   FVC-Post L 2.32   FVC-Predicted Post % 50   Pre FEV1/FVC % % 48  Post FEV1/FCV % % 50   FEV1-Pre L 1.09   FEV1-Predicted Pre % 29   FEV1-Post L 1.17   TLC L 5.63   TLC % Predicted % 74   RV % Predicted % 107     Echo 02/16/21: LV EF 60-65%. RV normal function and size.   Labs: Shane Fleming 02/14/21 negative  Assessment & Plan:   Bronchiectasis with acute lower respiratory infection (HCC) - Plan: umeclidinium-vilanterol (ANORO ELLIPTA ) 62.5-25 MCG/ACT AEPB, predniSONE   (DELTASONE ) 10 MG tablet, methylPREDNISolone  acetate (DEPO-MEDROL ) injection 80 mg  Discussion: Shane Fleming is a 53 year old male, never smoker with bronchiectasis who returns to pulmonary clinic for follow up for bronchiectasis and asthma-COPD overlap syndrome.   Asthma-COPD overlap Syndrome with acute flare Bronchiectasis  -Will work with pharmacy team about getting Dupixent  covered and refilled again.  - Pharmacy looking into why he hasn't been getting anoro -Continue budesonide  nebulizer treatments in the morning and evening. - use albuterol  inhaler or neb as needed -Continue use of flutter valve. -Continue anoro inhaler 1 puff daily when able  Follow up in 2 months.   Duaine German, MD [facility] Office: [phone]    Current Outpatient Medications:    albuterol  (PROVENTIL ) (2.5 MG/3ML) 0.083% nebulizer solution, Inhale 3 mLs into the lungs every 4 (four) hours as needed for wheezing or shortness of breath., Disp: 75 mL, Rfl: 12   albuterol  (VENTOLIN  HFA) 108 (90 Base) MCG/ACT inhaler, Inhale 2 puffs into the lungs every 6 (six) hours as needed for wheezing or shortness of breath (when travelling)., Disp: 20.1 g, Rfl: 6   budesonide  (PULMICORT ) 0.5 MG/2ML nebulizer solution, Take 2 mLs (0.5 mg total) by nebulization 2 (two) times daily., Disp: 120 mL, Rfl: 11   Dupilumab  (DUPIXENT ) 300 MG/2ML SOPN, Inject 300 mg into the skin every 14 (fourteen) days., Disp: 12 mL, Rfl: 1   montelukast  (SINGULAIR ) 10 MG tablet, Take 1 tablet (10 mg total) by mouth at bedtime., Disp: 90 tablet, Rfl: 3   predniSONE  (DELTASONE ) 10 MG tablet, Take 4 tablets (40 mg total) by mouth daily with breakfast for 3 days, THEN 3 tablets (30 mg total) daily with breakfast for 3 days, THEN 2 tablets (20 mg total) daily with breakfast for 3 days, THEN 1 tablet (10 mg total) daily with breakfast for 3 days., Disp: 30 tablet, Rfl: 0   umeclidinium-vilanterol (ANORO ELLIPTA ) 62.5-25  MCG/ACT AEPB, Inhale 1 puff into the lungs daily., Disp: 180 each, Rfl: 1

## 2023-11-06 NOTE — Telephone Encounter (Signed)
 Spoke w/ PT and explained what his pharmacy had told me and all he has to do is call and ask for refills.

## 2023-12-10 ENCOUNTER — Other Ambulatory Visit: Payer: Self-pay | Admitting: Pulmonary Disease

## 2023-12-10 ENCOUNTER — Other Ambulatory Visit: Payer: Self-pay

## 2023-12-10 DIAGNOSIS — J470 Bronchiectasis with acute lower respiratory infection: Secondary | ICD-10-CM

## 2023-12-12 ENCOUNTER — Other Ambulatory Visit: Payer: Self-pay

## 2023-12-13 ENCOUNTER — Other Ambulatory Visit: Payer: Self-pay | Admitting: Nurse Practitioner

## 2023-12-13 ENCOUNTER — Ambulatory Visit (INDEPENDENT_AMBULATORY_CARE_PROVIDER_SITE_OTHER): Payer: Self-pay | Admitting: Primary Care

## 2023-12-13 ENCOUNTER — Other Ambulatory Visit: Payer: Self-pay

## 2023-12-13 ENCOUNTER — Encounter (INDEPENDENT_AMBULATORY_CARE_PROVIDER_SITE_OTHER): Payer: Self-pay

## 2023-12-13 ENCOUNTER — Telehealth: Payer: Self-pay | Admitting: Pulmonary Disease

## 2023-12-13 DIAGNOSIS — J4489 Other specified chronic obstructive pulmonary disease: Secondary | ICD-10-CM

## 2023-12-13 DIAGNOSIS — J470 Bronchiectasis with acute lower respiratory infection: Secondary | ICD-10-CM

## 2023-12-13 MED ORDER — PREDNISONE 10 MG PO TABS
ORAL_TABLET | ORAL | 0 refills | Status: AC
  Filled 2023-12-13: qty 30, 12d supply, fill #0

## 2023-12-13 NOTE — Telephone Encounter (Signed)
 Lm for patient.   Devki, please let me know if I can assist with the Dupixent  process.

## 2023-12-13 NOTE — Telephone Encounter (Signed)
 I have sent in steroid taper for patient.  Devki, where are we at with his Dupixent ? He would benefit greatly from getting this medication back on board.  JD

## 2023-12-13 NOTE — Telephone Encounter (Signed)
 Patients PCP call asking to speak with Dr. Diania Fortes. He was not working that day and his CMA is with a PT. The call was transferred to Katie Parks.

## 2023-12-13 NOTE — Telephone Encounter (Signed)
 Spoke to [place] with [facility].  [place] stated that patient is in office for an acute visit and is requesting prednisone . Madelyn Schick, NP would like Dr. Diania Fortes to prescribe prednisone , as he has prescribed it previously.  Pt reports of non prod cough.  SOB is baseline. Denied f/c/s or additional sx. sx have been present for a couple of weeks. Sx improved after prednisone  that was prescribed 11/06/2023, but have since returned.  Denied f/c/s  or additional sx. He does not wear supplemental oxygen.  Dr. Diania Fortes, please advise. Thanks

## 2023-12-14 NOTE — Telephone Encounter (Signed)
 Per Devki's most recent conversation with pt:   Dupixent  [employer] requires income documentation for patients, however pt is unable to provide conventional income documents as he does not receive a paycheck. He was advised to contact a case manager and determine how this requirement could be satisfied or potentially circumvented altogether. We are unable to do this on his behalf as [employer] does not allow financial matters to be discussed with anyone other that the patient themselves.   Once pt has learned what information can be provided he can drop it off here at the clinic and we will ensure that it is submitted to [employer] for further consideration, however our hands remain tied until the pt does their part in the process.

## 2023-12-17 ENCOUNTER — Other Ambulatory Visit: Payer: Self-pay

## 2023-12-17 NOTE — Telephone Encounter (Signed)
 [employer] will not accept this note from employer. They have this on file. Patient was advised to call [employer] to determine what specifically he is able to submit if he does not file taxes or have pay stubs to submit.  I called patient today and he states that he has called [employer] multiple times and they have not been able to explain any other proof of income he can provide.  Unfortunately [employer] cannot submit a form on patient's behalf either since this is related to income.  Patient will need to switch to another biologic - Tezspire (most recent eos 100) but have been higher in past so could go for eosinophilic.  Routing to Dr. Delrae Field, PharmD, MPH, BCPS, CPP Clinical Pharmacist (Rheumatology and Pulmonology)

## 2023-12-18 ENCOUNTER — Other Ambulatory Visit: Payer: Self-pay

## 2023-12-18 ENCOUNTER — Telehealth: Payer: Self-pay | Admitting: Pharmacist

## 2023-12-18 NOTE — Telephone Encounter (Signed)
 Submitted Patient Assistance Application to [employer] for [facility] along with provider portion, patient portion, medication list. Will update patient when we receive a response.  Phone #: [phone] Fax #: [phone]  Geraldene Kleine, PharmD, MPH, BCPS, CPP Clinical Pharmacist (Rheumatology and Pulmonology)

## 2023-12-21 ENCOUNTER — Ambulatory Visit: Payer: Self-pay | Admitting: Nurse Practitioner

## 2023-12-21 ENCOUNTER — Other Ambulatory Visit: Payer: Self-pay

## 2023-12-24 NOTE — Telephone Encounter (Signed)
 Per automated [employer], patient is approved for Fasenra patient assistance from 12/19/2023 through 12/18/2024.  Case ID: [id number]  Patient states he talked to [employer] last week and he is getting shipment at some point this week. He has been advised to continue all inhaled medications at this time. He states that they are not working but I did review that he has severe asthma so must continue inhaled treatments and will start Fasenra. Dr. Kara will assess progress and response in several months.  Sherry Pennant, PharmD, MPH, BCPS, CPP Clinical Pharmacist (Rheumatology and Pulmonology)

## 2023-12-25 ENCOUNTER — Ambulatory Visit: Payer: Self-pay | Attending: Nurse Practitioner | Admitting: Nurse Practitioner

## 2023-12-25 ENCOUNTER — Encounter: Payer: Self-pay | Admitting: Nurse Practitioner

## 2023-12-25 VITALS — BP 125/75 | HR 98 | Resp 19 | Ht 71.0 in | Wt 145.2 lb

## 2023-12-25 DIAGNOSIS — J471 Bronchiectasis with (acute) exacerbation: Secondary | ICD-10-CM

## 2023-12-25 NOTE — Progress Notes (Signed)
 Assessment & Plan:  [name] was seen today for cough.  Diagnoses and all orders for this visit:  Bronchiectasis with acute exacerbation (HCC) Continue all medications as prescribed.  Pulmonology will reach out to him tomorrow He has been instructed to go to the emergency room if his breathing worsens.    Patient has been counseled on age-appropriate routine health concerns for screening and prevention. These are reviewed and up-to-date. Referrals have been placed accordingly. Immunizations are up-to-date or declined.    Subjective:   Chief Complaint  Patient presents with   Cough    Shane Fleming 53 y.o. male presents to office today with persistent cough.   He has a past medical history of Asthma, Prediabetes, and Sinusitis (2018).   He has a history of severe asthma and is currently being followed by Pulmonology.  He is awaiting receipt of Fasenra. States cough has gotten worse overall despite taking all of his medications as prescribed. He does state although cough has gotten worse that his breathing today is better than yesterday. He is still taking tapering prednisone  as prescribed. I contacted the PharmD for pulmonology regarding samples of fasenra. She will check with the office tomorrow to see what is available in regard to samples for him. He is currently prescribed: albuterol  nebs/ HFA, pulmicort  nebs, singulair  and anoro ellipta      Review of Systems  Constitutional:  Negative for fever, malaise/fatigue and weight loss.  HENT: Negative.  Negative for nosebleeds.   Eyes: Negative.  Negative for blurred vision, double vision and photophobia.  Respiratory:  Positive for cough and wheezing. Negative for shortness of breath.   Cardiovascular: Negative.  Negative for chest pain, palpitations and leg swelling.  Gastrointestinal: Negative.  Negative for heartburn, nausea and vomiting.  Musculoskeletal: Negative.  Negative for myalgias.  Neurological: Negative.  Negative  for dizziness, focal weakness, seizures and headaches.  Psychiatric/Behavioral: Negative.  Negative for suicidal ideas.     Past Medical History:  Diagnosis Date   Asthma    Prediabetes    Sinusitis 2018    Past Surgical History:  Procedure Laterality Date   APPENDECTOMY     BRONCHIAL WASHINGS  02/17/2021   Procedure: BRONCHIAL WASHINGS;  Surgeon: Kara Dorn NOVAK, MD;  Location: [facility] ENDOSCOPY;  Service: Pulmonary;;   LAPAROSCOPIC APPENDECTOMY N/A 04/08/2015   Procedure: APPENDECTOMY LAPAROSCOPIC;  Surgeon: Morene Olives, MD;  Location: [facility] ORS;  Service: General;  Laterality: N/A;   VIDEO BRONCHOSCOPY N/A 02/17/2021   Procedure: VIDEO BRONCHOSCOPY WITHOUT FLUORO;  Surgeon: Kara Dorn NOVAK, MD;  Location: [facility] ENDOSCOPY;  Service: Pulmonary;  Laterality: N/A;    Family History  Problem Relation Age of Onset   Asthma Father     Social History Reviewed with no changes to be made today.   Outpatient Medications Prior to Visit  Medication Sig Dispense Refill   albuterol  (PROVENTIL ) (2.5 MG/3ML) 0.083% nebulizer solution Inhale 3 mLs into the lungs every 4 (four) hours as needed for wheezing or shortness of breath. 75 mL 12   albuterol  (VENTOLIN  HFA) 108 (90 Base) MCG/ACT inhaler Inhale 2 puffs into the lungs every 6 (six) hours as needed for wheezing or shortness of breath (when travelling). 20.1 g 6   benralizumab (FASENRA PEN) 30 MG/ML prefilled autoinjector Inject into the skin. Inject 30mg  into the skin at Week 0, Week 4, Week 8 then every 8 weeks thereafter     budesonide  (PULMICORT ) 0.5 MG/2ML nebulizer solution Take 2 mLs (0.5 mg total) by nebulization 2 (two)  times daily. 120 mL 11   montelukast  (SINGULAIR ) 10 MG tablet Take 1 tablet (10 mg total) by mouth at bedtime. 90 tablet 3   predniSONE  (DELTASONE ) 10 MG tablet Take 4 tablets (40 mg total) by mouth daily with breakfast for 3 days, THEN 3 tablets (30 mg total) daily with breakfast for 3 days, THEN 2 tablets (20 mg  total) daily with breakfast for 3 days, THEN 1 tablet (10 mg total) daily with breakfast for 3 days. 30 tablet 0   umeclidinium-vilanterol (ANORO ELLIPTA ) 62.5-25 MCG/ACT AEPB Inhale 1 puff into the lungs daily. 180 each 1   No facility-administered medications prior to visit.    No Known Allergies     Objective:    BP 125/75 (BP Location: Left Arm, Patient Position: Sitting, Cuff Size: Normal)   Pulse 98   Resp 19   Ht 5' 11 (1.803 m)   Wt 145 lb 3.2 oz (65.9 kg)   SpO2 98%   BMI 20.25 kg/m  Wt Readings from Last 3 Encounters:  12/25/23 145 lb 3.2 oz (65.9 kg)  11/06/23 150 lb (68 kg)  09/19/23 142 lb 9.6 oz (64.7 kg)    Physical Exam Vitals and nursing note reviewed.  Constitutional:      Appearance: He is well-developed.  HENT:     Head: Normocephalic and atraumatic.   Cardiovascular:     Rate and Rhythm: Normal rate and regular rhythm.     Heart sounds: Normal heart sounds. No murmur heard.    No friction rub. No gallop.  Pulmonary:     Effort: Pulmonary effort is normal. Tachypnea present. No respiratory distress.     Breath sounds: Wheezing present. No decreased breath sounds, rhonchi or rales.  Chest:     Chest wall: No tenderness.  Abdominal:     General: Bowel sounds are normal.     Palpations: Abdomen is soft.   Musculoskeletal:        General: Normal range of motion.     Cervical back: Normal range of motion.   Skin:    General: Skin is warm and dry.   Neurological:     Mental Status: He is alert and oriented to person, place, and time.     Coordination: Coordination normal.   Psychiatric:        Behavior: Behavior normal. Behavior is cooperative.        Thought Content: Thought content normal.        Judgment: Judgment normal.          Patient has been counseled extensively about nutrition and exercise as well as the importance of adherence with medications and regular follow-up. The patient was given clear instructions to go to ER or  return to medical center if symptoms don't improve, worsen or new problems develop. The patient verbalized understanding.   Follow-up: Return if symptoms worsen or fail to improve.   Haze LELON Servant, FNP-BC [facility] [place], [place] [phone]   12/25/2023, 5:52 PM

## 2023-12-31 ENCOUNTER — Telehealth: Payer: Self-pay | Admitting: Pharmacist

## 2023-12-31 NOTE — Telephone Encounter (Signed)
 Received call from [employer] requesting clarification on Fasenra loading dose. They will expedite shipemtn  Sherry Pennant, PharmD, MPH, BCPS, CPP Clinical Pharmacist (Rheumatology and Pulmonology)

## 2024-01-07 ENCOUNTER — Other Ambulatory Visit: Payer: Self-pay

## 2024-01-16 ENCOUNTER — Other Ambulatory Visit: Payer: Self-pay

## 2024-01-17 ENCOUNTER — Telehealth: Payer: Self-pay | Admitting: Pharmacist

## 2024-01-17 ENCOUNTER — Ambulatory Visit (INDEPENDENT_AMBULATORY_CARE_PROVIDER_SITE_OTHER): Payer: Self-pay | Admitting: Pulmonary Disease

## 2024-01-17 ENCOUNTER — Telehealth: Payer: Self-pay

## 2024-01-17 ENCOUNTER — Encounter: Payer: Self-pay | Admitting: Pulmonary Disease

## 2024-01-17 ENCOUNTER — Other Ambulatory Visit: Payer: Self-pay

## 2024-01-17 DIAGNOSIS — J470 Bronchiectasis with acute lower respiratory infection: Secondary | ICD-10-CM

## 2024-01-17 MED ORDER — DOXYCYCLINE HYCLATE 100 MG PO TABS
100.0000 mg | ORAL_TABLET | Freq: Two times a day (BID) | ORAL | 0 refills | Status: DC
  Filled 2024-01-17: qty 28, 14d supply, fill #0

## 2024-01-17 MED ORDER — IPRATROPIUM-ALBUTEROL 0.5-2.5 (3) MG/3ML IN SOLN
3.0000 mL | Freq: Once | RESPIRATORY_TRACT | Status: AC
  Administered 2024-01-17: 3 mL via RESPIRATORY_TRACT

## 2024-01-17 MED ORDER — BUDESONIDE 0.5 MG/2ML IN SUSP
0.5000 mg | Freq: Two times a day (BID) | RESPIRATORY_TRACT | 11 refills | Status: AC
  Filled 2024-01-17: qty 120, 30d supply, fill #0

## 2024-01-17 MED ORDER — PREDNISONE 10 MG PO TABS
10.0000 mg | ORAL_TABLET | Freq: Every day | ORAL | 3 refills | Status: DC
  Filled 2024-01-17: qty 30, 30d supply, fill #0

## 2024-01-17 MED ORDER — UMECLIDINIUM-VILANTEROL 62.5-25 MCG/ACT IN AEPB
1.0000 | INHALATION_SPRAY | Freq: Every day | RESPIRATORY_TRACT | 5 refills | Status: DC
  Filled 2024-01-17: qty 60, 60d supply, fill #0

## 2024-01-17 MED ORDER — PREDNISONE 10 MG PO TABS
ORAL_TABLET | ORAL | 0 refills | Status: AC
  Filled 2024-01-17: qty 30, 12d supply, fill #0

## 2024-01-17 MED ORDER — METHYLPREDNISOLONE ACETATE 80 MG/ML IJ SUSP
80.0000 mg | Freq: Once | INTRAMUSCULAR | Status: AC
  Administered 2024-01-17: 80 mg via INTRAMUSCULAR

## 2024-01-17 MED ORDER — UMECLIDINIUM-VILANTEROL 62.5-25 MCG/ACT IN AEPB: 1.0000 | INHALATION_SPRAY | Freq: Every day | RESPIRATORY_TRACT | 5 refills | Status: DC

## 2024-01-17 NOTE — Addendum Note (Signed)
 Addended byBETHA JESSICA BOUCHARD S on: 01/17/2024 02:22 PM   Modules accepted: Orders

## 2024-01-17 NOTE — Telephone Encounter (Signed)
 ATC patient - unable to reach. Left VM advising him to call [employer] and set up shipment of medication again. Provided phone number for [employer] to call and reschedule shipment to home. Advised him to call clinic ASAP if any issues  Sherry Pennant, PharmD, MPH, BCPS, CPP Clinical Pharmacist (Rheumatology and Pulmonology)

## 2024-01-17 NOTE — Addendum Note (Signed)
 Addended by: Jaleesa Cervi on: 01/17/2024 04:41 PM   Modules accepted: Orders

## 2024-01-17 NOTE — Patient Instructions (Addendum)
 PLEASE CALL [employer] at [phone] to schedule FASENRA. THEY SAID YOU MUST CALL BACK   We will collect a sputum sample to send for culture today  We will give you steroid injection today  Tomorrow start steroid taper: 40mg  daily x 3 days 30mg  daily x 3 days 20mg  daily x 3 days 10mg  daily there after  Continue 10mg  daily prednisone  (1 tab) until we can start you on Fasenra  Continue anoro ellipta  1 puff daily  Continue budesonide  nebs twice daily  Use albuterol  inhaler as needed  Follow up in 3 months

## 2024-01-17 NOTE — Telephone Encounter (Signed)
 Called patient and provided him with next steps for [facility] . He states he has called [employer] today andw as advised that shipment will beo ut to him 6-8 days. I have requested he call our clinic if he does not receive medication at all from pharmacy. We will need to trouble shoot at that point.  Also advised him that [employer] application for Anoro has been submited today.  Will hopefully not receive any further issues   Sherry Pennant, PharmD, MPH, BCPS, CPP Clinical Pharmacist (Rheumatology and Pulmonology)

## 2024-01-17 NOTE — Progress Notes (Signed)
 Synopsis: Referred in September 202 for bronchiectasis  Subjective:   PATIENT ID: Shane Fleming GENDER: male DOB: [birth year], MRN: [id number]  HPI  Chief Complaint  Patient presents with   Follow-up    Pt states 'very bad cough' Coughing red or blue phlegm?  Cough gets worse at night.    Shane Fleming is a 53 year old male, never smoker with bronchiectasis who returns to pulmonary clinic for follow up for bronchiectasis and asthma.   He is having a lot of dyspnea, wheezing and increase in mucous production. He reports he is not doing well at this time. He has been without his anoro inhaler. He has not started fasenra injections yet despite being approved for patient assistance.   OV 11/06/23 He is having trouble with his breathing today. He is having cough and wheezing and significant dyspnea. He reports not being able to get Anoro ellipta . He has not been restarted on dupixent  either. He has been using budesonide  nebs and albuterol  nebs as needed.  OV 09/07/23 He has been off Dupixent  and is working on providing necessary documentation to facilitate the approval process. He has already submitted a letter from his workplace indicating his income, which is required for the approval.  He recently completed a course of prednisone  and Levaquin , which improved his symptoms. He is currently using Anoro, an inhaler, and a flutter valve for his respiratory management. He also has a nebulizer machine at home, which he uses regularly.  He mentions that he prefers the current albuterol  inhaler over a previous one, stating 'this one is better than the blue one.' He confirms that he has refills available for his inhaler.  OV 08/07/23 He has been experiencing worsening difficulty breathing due to a lack of medication. His inhaler is not effective, and he has not received his Dupixent  injections for over three months. Previously, prednisone  and Levaquin  were effective in managing his symptoms, reducing  cough, and improving appetite.  He has not received his Anoro inhaler for about a month and is no longer receiving Dupixent  injections. Dupixent  delivery stopped after questions related to his paycheck arose. He uses a nebulizer and inhaler but has not received a refill for the Anoro inhaler.  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 He mentions that the medication provided short-term relief but not prolonged relief.  No fevers, chills, or sweats, except when it is cold. He uses a flutter valve device every morning, which causes discomfort and does not help in bringing up mucus or causing him to cough.  OV 05/08/23 He was treated with prednisone  by his PCP in August for a breathing flare.   He presents with generalized malaise, increased cough, and mucus production. He reports feeling unwell daily, with body aches and sweats, even in cold weather. He describes his body as feeling 'hot' and has been experiencing what he believes to be fevers and chills. These symptoms have been ongoing for a 'long time,' but have worsened over the past three months since discontinuing Dupixent . Prior to stopping Dupixent , the patient reported improved breathing and increased activity, including returning to the mosque for prayer. However, since discontinuing the medication, he has noticed a decline in his overall health and well-being. He continues to use Anoro, which he reports as helpful. The patient also reports using a flutter valve daily and an albuterol  nebulizer treatment.  OV 05/2022 Patient was started on dupixent  02/01/22. He reports his breathing is better.  He is now going to the mosque for prayer as he was not able to do this before. He feels he could work a full 8 hour day as he feels stronger. He is coughing less, mainly in the morning times now. He is sleeping better at night as he is not coughing and wheezing as much.   He has not been able to get  Anoro over the past 2 months. He has been on patient assistance for this in the past.   OV 12/21/21 He was referred to Allergy  Immunology at last visit for evaluation of abnormal immunoglobulin levels with elevated IgG. He had allergy  testing performed with elevated IgE levels to dust mite, dog, grass, mold, tree, weed. He had low titers to strep pneumoniae and tetanus. He was recommended to get updated vaccines and plan for repeated titers. He was recommended to resume dulera  100-9mcg 2 puffs twice daily along with his anoro 1 puff daily. He was recommended to start  singulair  10mg  daily and zyrtec 10mg  daily for allergies.   He has been spending $500 on anoro the last couple of months as he does not have insurance coverage. He has not started dulera , singulair  or zyrtec due to affordability.   His breathing as been stable due to it being summer time.   OV 09/20/21 He reports persistent dyspnea and cough. He is not coughing any phlegm up He reports some improvement in his symptoms since switching from dulera  to stiolto. He reports decreased appetite over the past couple of weeks. He does have wheezing. He is having a harder time with his work due to the dyspnea.   PFTs today show mixed obstructive and restrictive defects. No significant bronchodilator response.   OV 06/20/2021 He has done well since last visit on Dulera  100-5 MCG 2 puffs twice daily along with initiation of pantoprazole  40 mg daily for GERD.  He has also been using albuterol  nebulizer treatments followed by flutter valve therapy. He reports since the weather has changed he has increased cough and shortness of breath.  He reports a slight increase in sputum production since his cough increased.  OV 03/23/21 Sputum culture was positive for Klebsiella pneumonia from expectorated sputum sample.  He completed 14 days of fluoroquinolone therapy and is feeling much better.  His fungal and AFB cultures show no growth to date from a  bronchoscopy with BAL on 02/17/2021.  CT chest scan from 02/11/2021 shows diffuse bronchial wall thickening and bilateral lower lobe predominant cylindrical bronchiectasis with mucoid impaction of the right lower airways.  He has multiple scattered pulmonary nodules measuring 5 to 9 mm in size.  He was discharged on home oxygen which she has stopped using over the past 1 week.  He was seen by his primary care provider 03/16/2021, note personally reviewed, and he was provided Dulera  inhaler 100-5 MCG 2 puffs twice daily along with as needed albuterol  inhaler and nebulizer solution.  He does have a nebulizer machine at home along with flutter valve.  He is finishing a course of prednisone  from his primary care.  He denies any issues with seasonal allergies.  He reports the cold air does bother his breathing in the wintertime.  He is a never smoker but reports secondhand smoke exposure from friends and relatives while growing up.  He is originally from [place].  He denies any significant exposure to charcoal or wood-burning stove's while growing up.  He denies any respiratory issues as a kid or in his younger years.  He reports he is only had a cough over the last 4 years.  Laboratory test reviewed from his hospitalization and they are negative for ANA, rheumatoid factor and ANCA testing.  IgG level is increased at 2000 464 and IgG4 subclass elevated at 106.  IgE level 252.  Past Medical History:  Diagnosis Date   Asthma    Prediabetes    Sinusitis 2018     Family History  Problem Relation Age of Onset   Asthma Father      Social History   Socioeconomic History   Marital status: Single    Spouse name: Not on file   Number of children: Not on file   Years of education: Not on file   Highest education level: Not on file  Occupational History   Not on file  Tobacco Use   Smoking status: Never    Passive exposure: Never   Smokeless tobacco: Never  Vaping Use   Vaping status: Never Used   Substance and Sexual Activity   Alcohol use: No   Drug use: No   Sexual activity: Not Currently  Other Topics Concern   Not on file  Social History Narrative   Not on file   Social Drivers of Health   Financial Resource Strain: Not on file  Food Insecurity: Not on file  Transportation Needs: Not on file  Physical Activity: Not on file  Stress: Not on file  Social Connections: Not on file  Intimate Partner Violence: Not on file     No Known Allergies   Outpatient Medications Prior to Visit  Medication Sig Dispense Refill   albuterol  (PROVENTIL ) (2.5 MG/3ML) 0.083% nebulizer solution Inhale 3 mLs into the lungs every 4 (four) hours as needed for wheezing or shortness of breath. 75 mL 12   albuterol  (VENTOLIN  HFA) 108 (90 Base) MCG/ACT inhaler Inhale 2 puffs into the lungs every 6 (six) hours as needed for wheezing or shortness of breath (when travelling). 20.1 g 6   benralizumab (FASENRA PEN) 30 MG/ML prefilled autoinjector Inject into the skin. Inject 30mg  into the skin at Week 0, Week 4, Week 8 then every 8 weeks thereafter (Patient not taking: Reported on 01/17/2024)     montelukast  (SINGULAIR ) 10 MG tablet Take 1 tablet (10 mg total) by mouth at bedtime. (Patient not taking: Reported on 01/17/2024) 90 tablet 3   budesonide  (PULMICORT ) 0.5 MG/2ML nebulizer solution Take 2 mLs (0.5 mg total) by nebulization 2 (two) times daily. (Patient not taking: Reported on 01/17/2024) 120 mL 11   umeclidinium-vilanterol (ANORO ELLIPTA ) 62.5-25 MCG/ACT AEPB Inhale 1 puff into the lungs daily. (Patient not taking: Reported on 01/17/2024) 180 each 1   No facility-administered medications prior to visit.    Review of Systems  Constitutional:  Negative for chills, fever, malaise/fatigue and weight loss.  HENT:  Negative for congestion, sinus pain and sore throat.   Eyes: Negative.   Respiratory:  Positive for cough, sputum production, shortness of breath and wheezing. Negative for hemoptysis.    Cardiovascular:  Negative for chest pain, palpitations, orthopnea, claudication and leg swelling.  Gastrointestinal:  Negative for abdominal pain, heartburn, nausea and vomiting.  Genitourinary: Negative.   Musculoskeletal:  Negative for joint pain and myalgias.  Skin:  Negative for rash.  Neurological:  Negative for weakness.  Endo/Heme/Allergies: Negative.   Psychiatric/Behavioral: Negative.      Objective:   Vitals:   01/17/24 1101  BP: 116/74  Pulse: (!) 109  SpO2: 93%  Weight: 141 lb 6.4  oz (64.1 kg)  Height: 5' 11 (1.803 m)   Physical Exam Constitutional:      General: He is not in acute distress. HENT:     Head: Normocephalic and atraumatic.  Eyes:     Conjunctiva/sclera: Conjunctivae normal.  Cardiovascular:     Rate and Rhythm: Normal rate and regular rhythm.     Pulses: Normal pulses.     Heart sounds: Normal heart sounds. No murmur heard. Pulmonary:     Effort: Pulmonary effort is normal.     Breath sounds: No decreased air movement. Wheezing and rhonchi present. No rales.  Musculoskeletal:     Right lower leg: No edema.     Left lower leg: No edema.  Neurological:     Mental Status: He is alert.    CBC    Component Value Date/Time   WBC 5.6 09/19/2023 1029   WBC 6.9 09/20/2021 1128   RBC 5.51 09/19/2023 1029   RBC 4.92 09/20/2021 1128   HGB 16.7 09/19/2023 1029   HCT 49.2 09/19/2023 1029   PLT 363 09/19/2023 1029   MCV 89 09/19/2023 1029   MCH 30.3 09/19/2023 1029   MCH 28.6 02/18/2021 0101   MCHC 33.9 09/19/2023 1029   MCHC 32.6 09/20/2021 1128   RDW 13.1 09/19/2023 1029   LYMPHSABS 3.0 09/19/2023 1029   MONOABS 0.6 09/20/2021 1128   EOSABS 0.1 09/19/2023 1029   BASOSABS 0.0 09/19/2023 1029      Latest Ref Rng & Units 09/19/2023   10:29 AM 02/16/2023    2:13 PM 08/18/2022   10:42 AM  BMP  Glucose 70 - 99 mg/dL 889  882  894   BUN 6 - 24 mg/dL 19  24  16    Creatinine 0.76 - 1.27 mg/dL 8.68  8.70  8.90   BUN/Creat Ratio 9 - 20 15  19   15    Sodium 134 - 144 mmol/L 138  141  140   Potassium 3.5 - 5.2 mmol/L 4.6  4.1  4.8   Chloride 96 - 106 mmol/L 100  101  100   CO2 20 - 29 mmol/L 22  25  26    Calcium 8.7 - 10.2 mg/dL 9.4  89.7  9.6    Chest imaging: CT Chest 06/15/21 1. Diffuse bilateral centrilobular nodules, some with tree-in-bud configuration are again seen with mild lower lung predominant cylindrical bronchiectasis, overall improved, however, there are some new areas of nodularity. Findings are likely due to chronic atypical infection, diffuse panbronchiolitis is an additional consideration given history of sinusitis. 2. Interval resolution of bilateral lower lobe mucoid impaction. 3. Largest new solid nodule measures 7 mm in mean diameter. Non-contrast chest CT at 6-12 months is recommended. If the nodule is stable at time of repeat CT, then future CT at 18-24 months (from today's scan) is considered optional for low-risk patients, but is recommended for high-risk patients. This recommendation follows the consensus statement: Guidelines for Management of Incidental Pulmonary Nodules Detected on CT Images: From the Fleischner Society 2017; Radiology 2017; 284:228-243. 4. Mildly patulous esophagus, finding can be seen in the setting of esophageal dysmotility.  CT chest 02/11/2021  diffuse bronchial wall thickening and bilateral lower lobe predominant cylindrical bronchiectasis with mucoid impaction of the right lower airways.  He has multiple scattered pulmonary nodules measuring 5 to 9 mm in size.  PFT:    Latest Ref Rng & Units 09/20/2021    9:56 AM  PFT Results  FVC-Pre L 2.26   FVC-Predicted Pre %  49   FVC-Post L 2.32   FVC-Predicted Post % 50   Pre FEV1/FVC % % 48   Post FEV1/FCV % % 50   FEV1-Pre L 1.09   FEV1-Predicted Pre % 29   FEV1-Post L 1.17   TLC L 5.63   TLC % Predicted % 74   RV % Predicted % 107     Echo 02/16/21: LV EF 60-65%. RV normal function and size.   Labs: Maya Cera 02/14/21 negative  Assessment & Plan:   Bronchiectasis with acute lower respiratory infection (HCC) - Plan: umeclidinium-vilanterol (ANORO ELLIPTA ) 62.5-25 MCG/ACT AEPB, budesonide  (PULMICORT ) 0.5 MG/2ML nebulizer solution, doxycycline  (VIBRA -TABS) 100 MG tablet, Respiratory or Resp and Sputum Culture, predniSONE  (DELTASONE ) 10 MG tablet, methylPREDNISolone  acetate (DEPO-MEDROL ) injection 80 mg, Respiratory or Resp and Sputum Culture, DISCONTINUED: predniSONE  (DELTASONE ) 10 MG tablet  Discussion: Costantino Milks is a 53 year old male, never smoker with bronchiectasis who returns to pulmonary clinic for follow up for bronchiectasis and asthma-COPD overlap syndrome.   Asthma-COPD overlap Syndrome with acute flare Bronchiectasis with acute exacerbation -Give 80mg  IM depomedrol injection today - Start prednisone  taper 40mg  daily x 3 days, 30mg  daily x 3 days, 20mg  daily x 3 days, 10mg  daily x 3 days - Plan to receive sample fasenra injection today as we await for more information from [facility] program regarding prescription - Continue anoro ellipta  1 puff daily - use budesonide  nebs twice daily - use albuterol  inhaler or nebs as needed - continue flutter valve therapy daily for airway clearance  Follow up in 3 months  Dorn Chill, MD [facility] Office: [phone]    Current Outpatient Medications:    albuterol  (PROVENTIL ) (2.5 MG/3ML) 0.083% nebulizer solution, Inhale 3 mLs into the lungs every 4 (four) hours as needed for wheezing or shortness of breath., Disp: 75 mL, Rfl: 12   albuterol  (VENTOLIN  HFA) 108 (90 Base) MCG/ACT inhaler, Inhale 2 puffs into the lungs every 6 (six) hours as needed for wheezing or shortness of breath (when travelling)., Disp: 20.1 g, Rfl: 6   doxycycline  (VIBRA -TABS) 100 MG tablet, Take 1 tablet (100 mg total) by mouth 2 (two) times daily., Disp: 28 tablet, Rfl: 0   predniSONE  (DELTASONE ) 10 MG tablet, Take 4 tablets (40 mg total)  by mouth daily with breakfast for 3 days, THEN 3 tablets (30 mg total) daily with breakfast for 3 days, THEN 2 tablets (20 mg total) daily with breakfast for 3 days, THEN 1 tablet (10 mg total) daily with breakfast for 3 days., Disp: 30 tablet, Rfl: 0   benralizumab (FASENRA PEN) 30 MG/ML prefilled autoinjector, Inject into the skin. Inject 30mg  into the skin at Week 0, Week 4, Week 8 then every 8 weeks thereafter (Patient not taking: Reported on 01/17/2024), Disp: , Rfl:    budesonide  (PULMICORT ) 0.5 MG/2ML nebulizer solution, Take 2 mLs (0.5 mg total) by nebulization 2 (two) times daily., Disp: 120 mL, Rfl: 11   montelukast  (SINGULAIR ) 10 MG tablet, Take 1 tablet (10 mg total) by mouth at bedtime. (Patient not taking: Reported on 01/17/2024), Disp: 90 tablet, Rfl: 3   umeclidinium-vilanterol (ANORO ELLIPTA ) 62.5-25 MCG/ACT AEPB, Inhale 1 puff into the lungs daily., Disp: 60 each, Rfl: 5

## 2024-01-17 NOTE — Telephone Encounter (Signed)
 Received secure Epic chat from Dr. Kara - patient reports never receiving Fasenra from [facility]  Called pharmacy who states they sent message to [facility]. They transferred me to [facility] who states that they now need to speak to patient again to verify shipment address, etc  Phone: [phone], option 4 [facility] Phone #: [phone]  Will f/u with patient later this afternoon  Sherry Pennant, PharmD, MPH, BCPS, CPP Clinical Pharmacist (Rheumatology and Pulmonology)

## 2024-01-17 NOTE — Telephone Encounter (Signed)
 [employer] patient assistance program paperwork handed into pharmacy

## 2024-01-17 NOTE — Telephone Encounter (Addendum)
 Submitted Patient Assistance Application to [employer] for Anoro Ellipta  along with patient portion and hard copy rx. Will update patient when we receive a response.  Phone: [phone] Fax: [phone]  Sherry Pennant, PharmD, MPH, BCPS, CPP Clinical Pharmacist (Rheumatology and Pulmonology)

## 2024-01-17 NOTE — Telephone Encounter (Signed)
 Copied from CRM [id number]. Topic: General - Other >> Jan 17, 2024  2:25 PM Celestine FALCON wrote: Reason for CRM: Pt called back after missing a call. I let the pt know that the clinic handed the [employer] patient assistance program paperwork to the pharmacy. If the clinic needs anything from the pt further please call the pt back at [phone] ok to leave a vm.

## 2024-01-20 LAB — RESPIRATORY CULTURE OR RESPIRATORY AND SPUTUM CULTURE
MICRO NUMBER:: 16712055
SPECIMEN QUALITY:: ADEQUATE

## 2024-01-21 ENCOUNTER — Other Ambulatory Visit: Payer: Self-pay

## 2024-01-21 ENCOUNTER — Ambulatory Visit: Payer: Self-pay | Admitting: Pulmonary Disease

## 2024-01-21 DIAGNOSIS — J470 Bronchiectasis with acute lower respiratory infection: Secondary | ICD-10-CM

## 2024-01-21 MED ORDER — AMOXICILLIN-POT CLAVULANATE 875-125 MG PO TABS
1.0000 | ORAL_TABLET | Freq: Two times a day (BID) | ORAL | 0 refills | Status: DC
  Filled 2024-01-21: qty 28, 14d supply, fill #0

## 2024-01-21 NOTE — Progress Notes (Signed)
 Please let patient know he is growing out klebsiella, I recommend he stop the doxycycline  antibiotic and start augmentin  for 14 days. I have sent in prescription. Thanks

## 2024-01-22 NOTE — Telephone Encounter (Signed)
 Spoke with patient verbalized understanding   NFN

## 2024-01-24 ENCOUNTER — Other Ambulatory Visit: Payer: Self-pay

## 2024-01-28 ENCOUNTER — Other Ambulatory Visit: Payer: Self-pay

## 2024-03-17 ENCOUNTER — Other Ambulatory Visit: Payer: Self-pay

## 2024-03-26 ENCOUNTER — Other Ambulatory Visit: Payer: Self-pay

## 2024-03-26 ENCOUNTER — Encounter: Payer: Self-pay | Admitting: Nurse Practitioner

## 2024-03-26 ENCOUNTER — Ambulatory Visit: Payer: Self-pay | Attending: Nurse Practitioner | Admitting: Nurse Practitioner

## 2024-03-26 VITALS — BP 130/83 | HR 91 | Resp 19 | Ht 71.0 in | Wt 137.6 lb

## 2024-03-26 DIAGNOSIS — Z23 Encounter for immunization: Secondary | ICD-10-CM

## 2024-03-26 DIAGNOSIS — J470 Bronchiectasis with acute lower respiratory infection: Secondary | ICD-10-CM

## 2024-03-26 DIAGNOSIS — Z7951 Long term (current) use of inhaled steroids: Secondary | ICD-10-CM

## 2024-03-26 DIAGNOSIS — R7303 Prediabetes: Secondary | ICD-10-CM

## 2024-03-26 DIAGNOSIS — Z79899 Other long term (current) drug therapy: Secondary | ICD-10-CM

## 2024-03-26 DIAGNOSIS — Z1211 Encounter for screening for malignant neoplasm of colon: Secondary | ICD-10-CM

## 2024-03-26 MED ORDER — ALBUTEROL SULFATE (2.5 MG/3ML) 0.083% IN NEBU
3.0000 mL | INHALATION_SOLUTION | RESPIRATORY_TRACT | 12 refills | Status: AC | PRN
  Filled 2024-03-26: qty 75, 5d supply, fill #0
  Filled 2024-05-21: qty 300, 17d supply, fill #1
  Filled 2024-07-22: qty 300, 17d supply, fill #2

## 2024-03-26 MED ORDER — MONTELUKAST SODIUM 10 MG PO TABS
10.0000 mg | ORAL_TABLET | Freq: Every day | ORAL | 3 refills | Status: AC
  Filled 2024-03-26: qty 90, 90d supply, fill #0
  Filled 2024-07-22: qty 90, 90d supply, fill #1

## 2024-03-26 NOTE — Progress Notes (Signed)
 Assessment & Plan:  Shane Fleming was seen today for asthma.  Diagnoses and all orders for this visit:  Bronchiectasis with acute lower respiratory infection (HCC) -     montelukast  (SINGULAIR ) 10 MG tablet; Take 1 tablet (10 mg total) by mouth at bedtime. -     albuterol  (PROVENTIL ) (2.5 MG/3ML) 0.083% nebulizer solution; Inhale 3 mLs into the lungs every 4 (four) hours as needed for wheezing or shortness of breath.  Need for influenza vaccination -     Flu vaccine trivalent PF, 6mos and older(Flulaval,Afluria,Fluarix,Fluzone)  Colon cancer screening -     Fecal occult blood, imunochemical  Prediabetes -     CMP14+EGFR -     Hemoglobin A1c    Patient has been counseled on age-appropriate routine health concerns for screening and prevention. These are reviewed and up-to-date. Referrals have been placed accordingly. Immunizations are up-to-date or declined.    Subjective:   Chief Complaint  Patient presents with   Asthma    Shane Fleming 53 y.o. male presents to office today for follow-up to restrictive airway disease.  He is currently being followed by pulmonology.  Shane Fleming was able to get his Fasenra through patient assistance and is using his Anoro Ellipta  inhaler as prescribed.  Unfortunately he has not been able to afford nebulizers and uses patient assistance has expired.  He was instructed to go downstairs to the pharmacy to patient assistance for this medication.  He is using his albuterol  inhaler and flutter valve therapy as needed several times a day.  Although he does not feel his symptoms have improved, his work of breathing is much less noticeable today here in office and his wheezing is also not as pronounced.  Review of Systems  Constitutional:  Positive for malaise/fatigue. Negative for fever and weight loss.  HENT: Negative.  Negative for nosebleeds.   Eyes: Negative.  Negative for blurred vision, double vision and photophobia.  Respiratory:  Positive for shortness  of breath and wheezing. Negative for cough.   Cardiovascular: Negative.  Negative for chest pain, palpitations and leg swelling.  Gastrointestinal: Negative.  Negative for heartburn, nausea and vomiting.  Musculoskeletal: Negative.  Negative for myalgias.  Neurological: Negative.  Negative for dizziness, focal weakness, seizures and headaches.  Psychiatric/Behavioral: Negative.  Negative for suicidal ideas.     Past Medical History:  Diagnosis Date   Asthma    Prediabetes    Sinusitis 2018    Past Surgical History:  Procedure Laterality Date   APPENDECTOMY     BRONCHIAL WASHINGS  02/17/2021   Procedure: BRONCHIAL WASHINGS;  Surgeon: Kara Dorn NOVAK, MD;  Location: [facility] ENDOSCOPY;  Service: Pulmonary;;   LAPAROSCOPIC APPENDECTOMY N/A 04/08/2015   Procedure: APPENDECTOMY LAPAROSCOPIC;  Surgeon: Morene Olives, MD;  Location: [facility] ORS;  Service: General;  Laterality: N/A;   VIDEO BRONCHOSCOPY N/A 02/17/2021   Procedure: VIDEO BRONCHOSCOPY WITHOUT FLUORO;  Surgeon: Kara Dorn NOVAK, MD;  Location: [facility] ENDOSCOPY;  Service: Pulmonary;  Laterality: N/A;    Family History  Problem Relation Age of Onset   Asthma Father     Social History Reviewed with no changes to be made today.   Outpatient Medications Prior to Visit  Medication Sig Dispense Refill   albuterol  (VENTOLIN  HFA) 108 (90 Base) MCG/ACT inhaler Inhale 2 puffs into the lungs every 6 (six) hours as needed for wheezing or shortness of breath (when travelling). 20.1 g 6   benralizumab (FASENRA PEN) 30 MG/ML prefilled autoinjector Inject into the skin. Inject 30mg  into the  skin at Week 0, Week 4, Week 8 then every 8 weeks thereafter     budesonide  (PULMICORT ) 0.5 MG/2ML nebulizer solution Take 2 mLs (0.5 mg total) by nebulization 2 (two) times daily. 120 mL 11   umeclidinium-vilanterol (ANORO ELLIPTA ) 62.5-25 MCG/ACT AEPB Inhale 1 puff into the lungs daily. 60 each 5   albuterol  (PROVENTIL ) (2.5 MG/3ML) 0.083% nebulizer solution  Inhale 3 mLs into the lungs every 4 (four) hours as needed for wheezing or shortness of breath. 75 mL 12   amoxicillin -clavulanate (AUGMENTIN ) 875-125 MG tablet Take 1 tablet by mouth 2 (two) times daily. (Patient not taking: Reported on 03/26/2024) 28 tablet 0   doxycycline  (VIBRA -TABS) 100 MG tablet Take 1 tablet (100 mg total) by mouth 2 (two) times daily. (Patient not taking: Reported on 03/26/2024) 28 tablet 0   montelukast  (SINGULAIR ) 10 MG tablet Take 1 tablet (10 mg total) by mouth at bedtime. (Patient not taking: Reported on 03/26/2024) 90 tablet 3   No facility-administered medications prior to visit.    No Known Allergies     Objective:    BP 130/83 (BP Location: Left Arm, Patient Position: Sitting, Cuff Size: Normal)   Pulse 91   Resp 19   Ht 5' 11 (1.803 m)   Wt 137 lb 9.6 oz (62.4 kg)   SpO2 99%   BMI 19.19 kg/m  Wt Readings from Last 3 Encounters:  03/26/24 137 lb 9.6 oz (62.4 kg)  01/17/24 141 lb 6.4 oz (64.1 kg)  12/25/23 145 lb 3.2 oz (65.9 kg)    Physical Exam Vitals and nursing note reviewed.  Constitutional:      Appearance: He is well-developed.  HENT:     Head: Normocephalic and atraumatic.  Cardiovascular:     Rate and Rhythm: Normal rate and regular rhythm.     Heart sounds: Normal heart sounds. No murmur heard.    No friction rub. No gallop.  Pulmonary:     Effort: Pulmonary effort is normal. No tachypnea or respiratory distress.     Breath sounds: Wheezing present. No decreased breath sounds, rhonchi or rales.  Chest:     Chest wall: No tenderness.  Abdominal:     General: Bowel sounds are normal.     Palpations: Abdomen is soft.  Musculoskeletal:        General: Normal range of motion.     Cervical back: Normal range of motion.  Skin:    General: Skin is warm and dry.  Neurological:     Mental Status: He is alert and oriented to person, place, and time.     Coordination: Coordination normal.  Psychiatric:        Behavior: Behavior  normal. Behavior is cooperative.        Thought Content: Thought content normal.        Judgment: Judgment normal.          Patient has been counseled extensively about nutrition and exercise as well as the importance of adherence with medications and regular follow-up. The patient was given clear instructions to go to ER or return to medical center if symptoms don't improve, worsen or new problems develop. The patient verbalized understanding.   Follow-up: Return in about 4 months (around 07/26/2024).   Haze LELON Servant, FNP-BC [facility] [place], [place] [phone]   03/26/2024, 2:01 PM

## 2024-03-27 LAB — CMP14+EGFR
ALT: 23 IU/L (ref 0–44)
AST: 27 IU/L (ref 0–40)
Albumin: 3.9 g/dL (ref 3.8–4.9)
Alkaline Phosphatase: 74 IU/L (ref 47–123)
BUN/Creatinine Ratio: 10 (ref 9–20)
BUN: 12 mg/dL (ref 6–24)
Bilirubin Total: 0.3 mg/dL (ref 0.0–1.2)
CO2: 24 mmol/L (ref 20–29)
Calcium: 9.5 mg/dL (ref 8.7–10.2)
Chloride: 100 mmol/L (ref 96–106)
Creatinine, Ser: 1.15 mg/dL (ref 0.76–1.27)
Globulin, Total: 4.3 g/dL (ref 1.5–4.5)
Glucose: 89 mg/dL (ref 70–99)
Potassium: 4.1 mmol/L (ref 3.5–5.2)
Sodium: 140 mmol/L (ref 134–144)
Total Protein: 8.2 g/dL (ref 6.0–8.5)
eGFR: 75 mL/min/1.73 (ref >59)

## 2024-03-27 LAB — HEMOGLOBIN A1C
Est. average glucose Bld gHb Est-mCnc: 128 mg/dL
Hgb A1c MFr Bld: 6.1 % — ABNORMAL HIGH (ref 4.8–5.6)

## 2024-04-02 ENCOUNTER — Ambulatory Visit: Payer: Self-pay | Admitting: Nurse Practitioner

## 2024-04-02 DIAGNOSIS — R195 Other fecal abnormalities: Secondary | ICD-10-CM

## 2024-04-02 DIAGNOSIS — Z1211 Encounter for screening for malignant neoplasm of colon: Secondary | ICD-10-CM

## 2024-04-03 LAB — FECAL OCCULT BLOOD, IMMUNOCHEMICAL: Fecal Occult Bld: POSITIVE — AB

## 2024-04-15 ENCOUNTER — Ambulatory Visit: Payer: Self-pay | Attending: Nurse Practitioner

## 2024-04-23 ENCOUNTER — Ambulatory Visit: Payer: Self-pay | Admitting: Pulmonary Disease

## 2024-04-23 ENCOUNTER — Other Ambulatory Visit: Payer: Self-pay

## 2024-04-23 ENCOUNTER — Encounter: Payer: Self-pay | Admitting: Pulmonary Disease

## 2024-04-23 VITALS — BP 122/62 | HR 104 | Ht 71.0 in | Wt 135.0 lb

## 2024-04-23 DIAGNOSIS — J470 Bronchiectasis with acute lower respiratory infection: Secondary | ICD-10-CM

## 2024-04-23 MED ORDER — METHYLPREDNISOLONE ACETATE 80 MG/ML IJ SUSP
80.0000 mg | Freq: Once | INTRAMUSCULAR | Status: AC
  Administered 2024-04-23: 80 mg via INTRAMUSCULAR

## 2024-04-23 MED ORDER — PREDNISONE 10 MG PO TABS
ORAL_TABLET | ORAL | 0 refills | Status: AC
  Filled 2024-04-23: qty 30, 12d supply, fill #0

## 2024-04-23 MED ORDER — AMOXICILLIN-POT CLAVULANATE 875-125 MG PO TABS
1.0000 | ORAL_TABLET | Freq: Two times a day (BID) | ORAL | 0 refills | Status: DC
  Filled 2024-04-23: qty 28, 14d supply, fill #0

## 2024-04-23 NOTE — Patient Instructions (Addendum)
 Continue fasenra injections every 2 months  We will give you steroid injection today  Start prednisone  taper tomorrow  Start augmentin  antibiotic twice daily for 14 days  Check sputum sample today, will call if need to change antibiotics  Continue anoro  Continue budesonide  nebs twice daily  Follow up in 1 month

## 2024-04-23 NOTE — Progress Notes (Unsigned)
 Synopsis: Referred in September 202 for bronchiectasis  Subjective:   PATIENT ID: Shane Fleming GENDER: male DOB: Jun 04, 1971, MRN: [id number]  HPI  Chief Complaint  Patient presents with   Medical Management of Chronic Issues    Pt states Bad cough 24/7 keeps him up at night    Shane Fleming is a 53 year old male, never smoker with bronchiectasis who returns to pulmonary clinic for follow up for bronchiectasis and asthma.   He is having a lot of dyspnea, wheezing and increase in mucous production. He reports he is not doing well at this time. He has been without his anoro inhaler. He has not started fasenra injections yet despite being approved for patient assistance.   OV 11/06/23 He is having trouble with his breathing today. He is having cough and wheezing and significant dyspnea. He reports not being able to get Anoro ellipta . He has not been restarted on dupixent  either. He has been using budesonide  nebs and albuterol  nebs as needed.  OV 09/07/23 He has been off Dupixent  and is working on providing necessary documentation to facilitate the approval process. He has already submitted a letter from his workplace indicating his income, which is required for the approval.  He recently completed a course of prednisone  and Levaquin , which improved his symptoms. He is currently using Anoro, an inhaler, and a flutter valve for his respiratory management. He also has a nebulizer machine at home, which he uses regularly.  He mentions that he prefers the current albuterol  inhaler over a previous one, stating 'this one is better than the blue one.' He confirms that he has refills available for his inhaler.  OV 08/07/23 He has been experiencing worsening difficulty breathing due to a lack of medication. His inhaler is not effective, and he has not received his Dupixent  injections for over three months. Previously, prednisone  and Levaquin  were effective in managing his symptoms, reducing cough, and  improving appetite.  He has not received his Anoro inhaler for about a month and is no longer receiving Dupixent  injections. Dupixent  delivery stopped after questions related to his paycheck arose. He uses a nebulizer and inhaler but has not received a refill for the Anoro inhaler.  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 He mentions that the medication provided short-term relief but not prolonged relief.  No fevers, chills, or sweats, except when it is cold. He uses a flutter valve device every morning, which causes discomfort and does not help in bringing up mucus or causing him to cough.  OV 05/08/23 He was treated with prednisone  by his PCP in August for a breathing flare.   He presents with generalized malaise, increased cough, and mucus production. He reports feeling unwell daily, with body aches and sweats, even in cold weather. He describes his body as feeling 'hot' and has been experiencing what he believes to be fevers and chills. These symptoms have been ongoing for a 'long time,' but have worsened over the past three months since discontinuing Dupixent . Prior to stopping Dupixent , the patient reported improved breathing and increased activity, including returning to the mosque for prayer. However, since discontinuing the medication, he has noticed a decline in his overall health and well-being. He continues to use Anoro, which he reports as helpful. The patient also reports using a flutter valve daily and an albuterol  nebulizer treatment.  OV 05/2022 Patient was started on dupixent  02/01/22. He reports his breathing is better. He is  now going to the mosque for prayer as he was not able to do this before. He feels he could work a full 8 hour day as he feels stronger. He is coughing less, mainly in the morning times now. He is sleeping better at night as he is not coughing and wheezing as much.   He has not been able to get Anoro over  the past 2 months. He has been on patient assistance for this in the past.   OV 12/21/21 He was referred to Allergy  Immunology at last visit for evaluation of abnormal immunoglobulin levels with elevated IgG. He had allergy  testing performed with elevated IgE levels to dust mite, dog, grass, mold, tree, weed. He had low titers to strep pneumoniae and tetanus. He was recommended to get updated vaccines and plan for repeated titers. He was recommended to resume dulera  100-53mcg 2 puffs twice daily along with his anoro 1 puff daily. He was recommended to start  singulair  10mg  daily and zyrtec 10mg  daily for allergies.   He has been spending $500 on anoro the last couple of months as he does not have insurance coverage. He has not started dulera , singulair  or zyrtec due to affordability.   His breathing as been stable due to it being summer time.   OV 09/20/21 He reports persistent dyspnea and cough. He is not coughing any phlegm up He reports some improvement in his symptoms since switching from dulera  to stiolto. He reports decreased appetite over the past couple of weeks. He does have wheezing. He is having a harder time with his work due to the dyspnea.   PFTs today show mixed obstructive and restrictive defects. No significant bronchodilator response.   OV 06/20/2021 He has done well since last visit on Dulera  100-5 MCG 2 puffs twice daily along with initiation of pantoprazole  40 mg daily for GERD.  He has also been using albuterol  nebulizer treatments followed by flutter valve therapy. He reports since the weather has changed he has increased cough and shortness of breath.  He reports a slight increase in sputum production since his cough increased.  OV 03/23/21 Sputum culture was positive for Klebsiella pneumonia from expectorated sputum sample.  He completed 14 days of fluoroquinolone therapy and is feeling much better.  His fungal and AFB cultures show no growth to date from a bronchoscopy with  BAL on 02/17/2021.  CT chest scan from 02/11/2021 shows diffuse bronchial wall thickening and bilateral lower lobe predominant cylindrical bronchiectasis with mucoid impaction of the right lower airways.  He has multiple scattered pulmonary nodules measuring 5 to 9 mm in size.  He was discharged on home oxygen which she has stopped using over the past 1 week.  He was seen by his primary care provider 03/16/2021, note personally reviewed, and he was provided Dulera  inhaler 100-5 MCG 2 puffs twice daily along with as needed albuterol  inhaler and nebulizer solution.  He does have a nebulizer machine at home along with flutter valve.  He is finishing a course of prednisone  from his primary care.  He denies any issues with seasonal allergies.  He reports the cold air does bother his breathing in the wintertime.  He is a never smoker but reports secondhand smoke exposure from friends and relatives while growing up.  He is originally from [place].  He denies any significant exposure to charcoal or wood-burning stove's while growing up.  He denies any respiratory issues as a kid or in his younger years.  He reports  he is only had a cough over the last 4 years.  Laboratory test reviewed from his hospitalization and they are negative for ANA, rheumatoid factor and ANCA testing.  IgG level is increased at 2000 464 and IgG4 subclass elevated at 106.  IgE level 252.  Past Medical History:  Diagnosis Date   Asthma    Prediabetes    Sinusitis 2018     Family History  Problem Relation Age of Onset   Asthma Father      Social History   Socioeconomic History   Marital status: Single    Spouse name: Not on file   Number of children: Not on file   Years of education: Not on file   Highest education level: Not on file  Occupational History   Not on file  Tobacco Use   Smoking status: Never    Passive exposure: Never   Smokeless tobacco: Never  Vaping Use   Vaping status: Never Used  Substance and  Sexual Activity   Alcohol use: No   Drug use: No   Sexual activity: Not Currently  Other Topics Concern   Not on file  Social History Narrative   Not on file   Social Drivers of Health   Financial Resource Strain: Not on file  Food Insecurity: Not on file  Transportation Needs: Not on file  Physical Activity: Not on file  Stress: Not on file  Social Connections: Not on file  Intimate Partner Violence: Not on file     No Known Allergies   Outpatient Medications Prior to Visit  Medication Sig Dispense Refill   albuterol  (PROVENTIL ) (2.5 MG/3ML) 0.083% nebulizer solution Inhale 3 mLs into the lungs every 4 (four) hours as needed for wheezing or shortness of breath. 75 mL 12   albuterol  (VENTOLIN  HFA) 108 (90 Base) MCG/ACT inhaler Inhale 2 puffs into the lungs every 6 (six) hours as needed for wheezing or shortness of breath (when travelling). 20.1 g 6   benralizumab (FASENRA PEN) 30 MG/ML prefilled autoinjector Inject into the skin. Inject 30mg  into the skin at Week 0, Week 4, Week 8 then every 8 weeks thereafter     budesonide  (PULMICORT ) 0.5 MG/2ML nebulizer solution Take 2 mLs (0.5 mg total) by nebulization 2 (two) times daily. 120 mL 11   montelukast  (SINGULAIR ) 10 MG tablet Take 1 tablet (10 mg total) by mouth at bedtime. 90 tablet 3   umeclidinium-vilanterol (ANORO ELLIPTA ) 62.5-25 MCG/ACT AEPB Inhale 1 puff into the lungs daily. 60 each 5   No facility-administered medications prior to visit.    Review of Systems  Constitutional:  Negative for chills, fever, malaise/fatigue and weight loss.  HENT:  Negative for congestion, sinus pain and sore throat.   Eyes: Negative.   Respiratory:  Positive for cough, sputum production, shortness of breath and wheezing. Negative for hemoptysis.   Cardiovascular:  Negative for chest pain, palpitations, orthopnea, claudication and leg swelling.  Gastrointestinal:  Negative for abdominal pain, heartburn, nausea and vomiting.   Genitourinary: Negative.   Musculoskeletal:  Negative for joint pain and myalgias.  Skin:  Negative for rash.  Neurological:  Negative for weakness.  Endo/Heme/Allergies: Negative.   Psychiatric/Behavioral: Negative.      Objective:   Vitals:   04/23/24 1059  BP: 122/62  Pulse: (!) 104  SpO2: 93%  Weight: 135 lb (61.2 kg)  Height: 5' 11 (1.803 m)   Physical Exam Constitutional:      General: He is not in acute distress. HENT:  Head: Normocephalic and atraumatic.  Eyes:     Conjunctiva/sclera: Conjunctivae normal.  Cardiovascular:     Rate and Rhythm: Normal rate and regular rhythm.     Pulses: Normal pulses.     Heart sounds: Normal heart sounds. No murmur heard. Pulmonary:     Effort: Pulmonary effort is normal.     Breath sounds: No decreased air movement. Wheezing and rhonchi present. No rales.  Musculoskeletal:     Right lower leg: No edema.     Left lower leg: No edema.  Neurological:     Mental Status: He is alert.    CBC    Component Value Date/Time   WBC 5.6 09/19/2023 1029   WBC 6.9 09/20/2021 1128   RBC 5.51 09/19/2023 1029   RBC 4.92 09/20/2021 1128   HGB 16.7 09/19/2023 1029   HCT 49.2 09/19/2023 1029   PLT 363 09/19/2023 1029   MCV 89 09/19/2023 1029   MCH 30.3 09/19/2023 1029   MCH 28.6 02/18/2021 0101   MCHC 33.9 09/19/2023 1029   MCHC 32.6 09/20/2021 1128   RDW 13.1 09/19/2023 1029   LYMPHSABS 3.0 09/19/2023 1029   MONOABS 0.6 09/20/2021 1128   EOSABS 0.1 09/19/2023 1029   BASOSABS 0.0 09/19/2023 1029      Latest Ref Rng & Units 03/26/2024   11:53 AM 09/19/2023   10:29 AM 02/16/2023    2:13 PM  BMP  Glucose 70 - 99 mg/dL 89  889  882   BUN 6 - 24 mg/dL 12  19  24    Creatinine 0.76 - 1.27 mg/dL 8.84  8.68  8.70   BUN/Creat Ratio 9 - 20 10  15  19    Sodium 134 - 144 mmol/L 140  138  141   Potassium 3.5 - 5.2 mmol/L 4.1  4.6  4.1   Chloride 96 - 106 mmol/L 100  100  101   CO2 20 - 29 mmol/L 24  22  25    Calcium 8.7 - 10.2 mg/dL  9.5  9.4  89.7    Chest imaging: CT Chest 06/15/21 1. Diffuse bilateral centrilobular nodules, some with tree-in-bud configuration are again seen with mild lower lung predominant cylindrical bronchiectasis, overall improved, however, there are some new areas of nodularity. Findings are likely due to chronic atypical infection, diffuse panbronchiolitis is an additional consideration given history of sinusitis. 2. Interval resolution of bilateral lower lobe mucoid impaction. 3. Largest new solid nodule measures 7 mm in mean diameter. Non-contrast chest CT at 6-12 months is recommended. If the nodule is stable at time of repeat CT, then future CT at 18-24 months (from today's scan) is considered optional for low-risk patients, but is recommended for high-risk patients. This recommendation follows the consensus statement: Guidelines for Management of Incidental Pulmonary Nodules Detected on CT Images: From the Fleischner Society 2017; Radiology 2017; 284:228-243. 4. Mildly patulous esophagus, finding can be seen in the setting of esophageal dysmotility.  CT chest 02/11/2021  diffuse bronchial wall thickening and bilateral lower lobe predominant cylindrical bronchiectasis with mucoid impaction of the right lower airways.  He has multiple scattered pulmonary nodules measuring 5 to 9 mm in size.  PFT:    Latest Ref Rng & Units 09/20/2021    9:56 AM  PFT Results  FVC-Pre L 2.26   FVC-Predicted Pre % 49   FVC-Post L 2.32   FVC-Predicted Post % 50   Pre FEV1/FVC % % 48   Post FEV1/FCV % % 50   FEV1-Pre L  1.09   FEV1-Predicted Pre % 29   FEV1-Post L 1.17   TLC L 5.63   TLC % Predicted % 74   RV % Predicted % 107     Echo 02/16/21: LV EF 60-65%. RV normal function and size.   Labs: Maya Cera 02/14/21 negative  Assessment & Plan:   No diagnosis found.  Discussion: Shane Fleming is a 53 year old male, never smoker with bronchiectasis who returns to pulmonary clinic for  follow up for bronchiectasis and asthma-COPD overlap syndrome.   Asthma-COPD overlap Syndrome with acute flare Bronchiectasis with acute exacerbation -Give 80mg  IM depomedrol injection today - Start prednisone  taper 40mg  daily x 3 days, 30mg  daily x 3 days, 20mg  daily x 3 days, 10mg  daily x 3 days - Plan to receive sample fasenra injection today as we await for more information from [place] program regarding prescription - Continue anoro ellipta  1 puff daily - use budesonide  nebs twice daily - use albuterol  inhaler or nebs as needed - continue flutter valve therapy daily for airway clearance  Follow up in 3 months  Shane Chill, MD [facility] Office: [phone]    Current Outpatient Medications:    albuterol  (PROVENTIL ) (2.5 MG/3ML) 0.083% nebulizer solution, Inhale 3 mLs into the lungs every 4 (four) hours as needed for wheezing or shortness of breath., Disp: 75 mL, Rfl: 12   albuterol  (VENTOLIN  HFA) 108 (90 Base) MCG/ACT inhaler, Inhale 2 puffs into the lungs every 6 (six) hours as needed for wheezing or shortness of breath (when travelling)., Disp: 20.1 g, Rfl: 6   benralizumab (FASENRA PEN) 30 MG/ML prefilled autoinjector, Inject into the skin. Inject 30mg  into the skin at Week 0, Week 4, Week 8 then every 8 weeks thereafter, Disp: , Rfl:    budesonide  (PULMICORT ) 0.5 MG/2ML nebulizer solution, Take 2 mLs (0.5 mg total) by nebulization 2 (two) times daily., Disp: 120 mL, Rfl: 11   montelukast  (SINGULAIR ) 10 MG tablet, Take 1 tablet (10 mg total) by mouth at bedtime., Disp: 90 tablet, Rfl: 3   umeclidinium-vilanterol (ANORO ELLIPTA ) 62.5-25 MCG/ACT AEPB, Inhale 1 puff into the lungs daily., Disp: 60 each, Rfl: 5

## 2024-04-25 ENCOUNTER — Encounter: Payer: Self-pay | Admitting: Pulmonary Disease

## 2024-04-26 LAB — RESPIRATORY CULTURE OR RESPIRATORY AND SPUTUM CULTURE
MICRO NUMBER:: 17133125
SPECIMEN QUALITY:: ADEQUATE

## 2024-04-28 ENCOUNTER — Ambulatory Visit: Payer: Self-pay | Admitting: Pulmonary Disease

## 2024-04-28 NOTE — Progress Notes (Signed)
 Please let patient know that sputum culture is growing Klebsiella, he is on correct antibiotic.

## 2024-04-28 NOTE — Telephone Encounter (Signed)
 Spoke w/ patient Shane Fleming, if anything changes or any concerns to give us  a call back.    Patient states thank you

## 2024-05-21 ENCOUNTER — Ambulatory Visit: Payer: Self-pay | Admitting: Pulmonary Disease

## 2024-05-21 ENCOUNTER — Other Ambulatory Visit: Payer: Self-pay

## 2024-05-21 ENCOUNTER — Encounter: Payer: Self-pay | Admitting: Pulmonary Disease

## 2024-05-21 VITALS — BP 116/75 | HR 94 | Ht 71.0 in | Wt 133.8 lb

## 2024-05-21 DIAGNOSIS — J479 Bronchiectasis, uncomplicated: Secondary | ICD-10-CM

## 2024-05-21 DIAGNOSIS — J4489 Other specified chronic obstructive pulmonary disease: Secondary | ICD-10-CM

## 2024-05-21 MED ORDER — PREDNISONE 1 MG PO TABS
1.0000 mg | ORAL_TABLET | Freq: Every day | ORAL | 3 refills | Status: DC
  Filled 2024-05-21: qty 30, 30d supply, fill #0

## 2024-05-21 NOTE — Progress Notes (Signed)
 Established Patient Pulmonology Office Visit   Subjective:  Patient ID: Shane Fleming, male    DOB: May 21, 1971  MRN: [id number]  CC:  Chief Complaint  Patient presents with   Medical Management of Chronic Issues    Pt states     Discussed the use of AI scribe software for clinical note transcription with the patient, who gave verbal consent to proceed.  History of Present Illness Shane Fleming is a 53 year old male with bronchiectasis and severe persistent eosinophilic asthma who presents for follow-up.  He previously experienced significant symptom improvement with Dupixent  but discontinued it due to financial support constraints and inability to get patient assistance. He began Fasenra injections in July without notable improvement in respiratory symptoms. In October 2025, he experienced a bronchiectasis exacerbation treated with prednisone  and Augmentin . Sputum culture grew Klebsiella pneumoniae resistant to ciprofloxacin  and Bactrim.  He currently has a persistent cough with mucus production, though less severe. He denies fever or chills but reports a 'scratchy' sensation in his body and dry mouth for two weeks. He uses chapstick for dry lips.  He has refills for Anoro and albuterol  inhalers but requires more nebulizer solution. He is a never smoker.        ROS    Current Outpatient Medications:    albuterol  (PROVENTIL ) (2.5 MG/3ML) 0.083% nebulizer solution, Inhale 3 mLs into the lungs every 4 (four) hours as needed for wheezing or shortness of breath., Disp: 75 mL, Rfl: 12   albuterol  (VENTOLIN  HFA) 108 (90 Base) MCG/ACT inhaler, Inhale 2 puffs into the lungs every 6 (six) hours as needed for wheezing or shortness of breath (when travelling)., Disp: 20.1 g, Rfl: 6   benralizumab (FASENRA PEN) 30 MG/ML prefilled autoinjector, Inject into the skin. Inject 30mg  into the skin at Week 0, Week 4, Week 8 then every 8 weeks thereafter, Disp: , Rfl:    budesonide  (PULMICORT ) 0.5  MG/2ML nebulizer solution, Take 2 mLs (0.5 mg total) by nebulization 2 (two) times daily., Disp: 120 mL, Rfl: 11   montelukast  (SINGULAIR ) 10 MG tablet, Take 1 tablet (10 mg total) by mouth at bedtime., Disp: 90 tablet, Rfl: 3   predniSONE  (DELTASONE ) 1 MG tablet, Take 1 tablet (1 mg total) by mouth daily with breakfast., Disp: 30 tablet, Rfl: 3   umeclidinium-vilanterol (ANORO ELLIPTA ) 62.5-25 MCG/ACT AEPB, Inhale 1 puff into the lungs daily., Disp: 60 each, Rfl: 5      Objective:  BP 116/75   Pulse 94   Ht 5' 11 (1.803 m)   Wt 133 lb 12.8 oz (60.7 kg)   SpO2 93%   BMI 18.66 kg/m     Physical Exam Constitutional:      General: He is not in acute distress.    Appearance: Normal appearance.  Eyes:     General: No scleral icterus.    Conjunctiva/sclera: Conjunctivae normal.  Cardiovascular:     Rate and Rhythm: Normal rate and regular rhythm.  Pulmonary:     Breath sounds: No wheezing, rhonchi or rales.  Musculoskeletal:     Right lower leg: No edema.     Left lower leg: No edema.  Skin:    General: Skin is warm and dry.  Neurological:     General: No focal deficit present.      Diagnostic Review:  Last CBC Lab Results  Component Value Date   WBC 5.6 09/19/2023   HGB 16.7 09/19/2023   HCT 49.2 09/19/2023   MCV 89 09/19/2023  MCH 30.3 09/19/2023   RDW 13.1 09/19/2023   PLT 363 09/19/2023   Last metabolic panel Lab Results  Component Value Date   GLUCOSE 89 03/26/2024   NA 140 03/26/2024   K 4.1 03/26/2024   CL 100 03/26/2024   CO2 24 03/26/2024   BUN 12 03/26/2024   CREATININE 1.15 03/26/2024   EGFR 75 03/26/2024   CALCIUM 9.5 03/26/2024   PHOS 3.9 02/13/2021   PROT 8.2 03/26/2024   ALBUMIN 3.9 03/26/2024   LABGLOB 4.3 03/26/2024   AGRATIO 1.2 08/18/2022   BILITOT 0.3 03/26/2024   ALKPHOS 74 03/26/2024   AST 27 03/26/2024   ALT 23 03/26/2024   ANIONGAP 5 02/18/2021    CT Chest 02/19/23 1. 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 2. The previously demonstrated 7 mm right upper lobe nodule is less solid and has an appearance more compatible with a small area of scarring now, similar to other areas of scarring in both lungs. 3. No new findings requiring follow-up.     Assessment & Plan:   Assessment & Plan Asthma-COPD overlap syndrome (HCC)  Orders:   predniSONE  (DELTASONE ) 1 MG tablet; Take 1 tablet (1 mg total) by mouth daily with breakfast.  Bronchiectasis without complication (HCC)      Assessment and Plan Assessment & Plan Severe persistent eosinophilic asthma Significant improvement on Dupixent  previously, but financial constraints halted use. Fasenra ineffective. Considering resuming Dupixent . - Initiated low dose prednisone , 10mg  daily until Dupixent  can be resumed. - Provided prescription for prednisone  with two refills for three months. - Attempt to resume Dupixent  treatment. - Continue anoro 1 puff daily and budesonide  neb twice daily - use albuterol  inhaler as needed  Bronchiectasis with recent exacerbation Recent exacerbation treated with prednisone  taper and Augmentin . Sputum culture showed Klebsiella pneumoniae resistant to ciprofloxacin  and Bactrim.       Return in about 3 months (around 08/21/2024) for f/u visit Dr. Kara.   Dorn KATHEE Kara, MD

## 2024-05-21 NOTE — Patient Instructions (Signed)
 Continue fasenra injections every 2 months  Take prednisone  10mg  daily while we work on getting you re-qualified for dupixent   Continue anoro 1 puff daily  Continue budesonide  nebs twice daily  Use albuterol  inhaler or nebulizer treatments as needed  Follow up in 3 months

## 2024-05-21 NOTE — Assessment & Plan Note (Addendum)
 SABRA

## 2024-06-13 ENCOUNTER — Other Ambulatory Visit: Payer: Self-pay

## 2024-07-04 ENCOUNTER — Other Ambulatory Visit: Payer: Self-pay

## 2024-07-04 ENCOUNTER — Emergency Department (HOSPITAL_COMMUNITY): Payer: Self-pay

## 2024-07-04 ENCOUNTER — Encounter (HOSPITAL_COMMUNITY): Payer: Self-pay

## 2024-07-04 ENCOUNTER — Emergency Department (HOSPITAL_COMMUNITY)
Admission: EM | Admit: 2024-07-04 | Discharge: 2024-07-04 | Disposition: A | Discharge status: Home / Self Care | Payer: Self-pay | Source: Home / Self Care | Attending: Emergency Medicine | Admitting: Emergency Medicine

## 2024-07-04 DIAGNOSIS — J45909 Unspecified asthma, uncomplicated: Secondary | ICD-10-CM | POA: Insufficient documentation

## 2024-07-04 DIAGNOSIS — J189 Pneumonia, unspecified organism: Secondary | ICD-10-CM | POA: Insufficient documentation

## 2024-07-04 DIAGNOSIS — E871 Hypo-osmolality and hyponatremia: Secondary | ICD-10-CM | POA: Insufficient documentation

## 2024-07-04 DIAGNOSIS — Z7951 Long term (current) use of inhaled steroids: Secondary | ICD-10-CM | POA: Insufficient documentation

## 2024-07-04 LAB — RESP PANEL BY RT-PCR (RSV, FLU A&B, COVID)  RVPGX2
Influenza A by PCR: NEGATIVE
Influenza B by PCR: NEGATIVE
Resp Syncytial Virus by PCR: NEGATIVE
SARS Coronavirus 2 by RT PCR: NEGATIVE

## 2024-07-04 LAB — CBC
HCT: 37 % — ABNORMAL LOW (ref 39.0–52.0)
Hemoglobin: 12.4 g/dL — ABNORMAL LOW (ref 13.0–17.0)
MCH: 28.4 pg (ref 26.0–34.0)
MCHC: 33.5 g/dL (ref 30.0–36.0)
MCV: 84.7 fL (ref 80.0–100.0)
Platelets: 433 K/uL — ABNORMAL HIGH (ref 150–400)
RBC: 4.37 MIL/uL (ref 4.22–5.81)
RDW: 13.8 % (ref 11.5–15.5)
WBC: 7.3 K/uL (ref 4.0–10.5)
nRBC: 0 % (ref 0.0–0.2)

## 2024-07-04 LAB — BASIC METABOLIC PANEL WITH GFR
Anion gap: 11 (ref 5–15)
BUN: 16 mg/dL (ref 6–20)
CO2: 27 mmol/L (ref 22–32)
Calcium: 9.3 mg/dL (ref 8.9–10.3)
Chloride: 94 mmol/L — ABNORMAL LOW (ref 98–111)
Creatinine, Ser: 0.89 mg/dL (ref 0.61–1.24)
GFR, Estimated: >60 mL/min
Glucose, Bld: 128 mg/dL — ABNORMAL HIGH (ref 70–99)
Potassium: 3.9 mmol/L (ref 3.5–5.1)
Sodium: 131 mmol/L — ABNORMAL LOW (ref 135–145)

## 2024-07-04 MED ORDER — AMOXICILLIN-POT CLAVULANATE 875-125 MG PO TABS
1.0000 | ORAL_TABLET | Freq: Once | ORAL | Status: AC
  Administered 2024-07-04: 1 via ORAL
  Filled 2024-07-04: qty 1

## 2024-07-04 MED ORDER — AMOXICILLIN-POT CLAVULANATE 875-125 MG PO TABS
1.0000 | ORAL_TABLET | Freq: Two times a day (BID) | ORAL | 0 refills | Status: DC
  Filled 2024-07-04: qty 27, 14d supply, fill #0

## 2024-07-04 MED ORDER — IPRATROPIUM-ALBUTEROL 0.5-2.5 (3) MG/3ML IN SOLN
3.0000 mL | Freq: Once | RESPIRATORY_TRACT | Status: AC
  Administered 2024-07-04: 3 mL via RESPIRATORY_TRACT
  Filled 2024-07-04: qty 3

## 2024-07-04 MED ORDER — IOHEXOL 350 MG/ML SOLN
75.0000 mL | Freq: Once | INTRAVENOUS | Status: AC | PRN
  Administered 2024-07-04: 75 mL via INTRAVENOUS

## 2024-07-04 NOTE — ED Notes (Signed)
 Patient transported to X-ray

## 2024-07-04 NOTE — ED Notes (Signed)
 Pt verbalized understanding of discharge instructions. Opportunity for questions provided.

## 2024-07-04 NOTE — Discharge Instructions (Signed)
 Take the antibiotic as prescribed.  Follow-up with your doctors in the pulmonary clinic to be rechecked to make sure you are improving.  Return to the ER for fevers or other concerning symptoms

## 2024-07-04 NOTE — ED Triage Notes (Addendum)
 Patient reports coughing and asthma attack x 2 days.   Reports no relief with inhaler. SOB progressively worsening. Productive cough  Denies fever

## 2024-07-04 NOTE — ED Provider Notes (Signed)
 " [place] EMERGENCY DEPARTMENT AT [facility] Provider Note   CSN: [id number] Arrival date & time: 07/04/24  9666     Patient presents with: Asthma, Cough, and Shortness of Breath   Shane Fleming is a 54 y.o. male.    Asthma Associated symptoms include shortness of breath.  Cough Associated symptoms: shortness of breath   Shortness of Breath Associated symptoms: cough    Patient has a history of asthma prediabetes, bronchiectasis.  Patient states he has been having difficulty with his breathing ongoing for several months.  Patient states he has been feeling short of breath.  He feels like he has been wheezing.  He has been bringing up sputum.  He denies any fever.  He denies any weight loss.  Prior to Admission medications  Medication Sig Start Date End Date Taking? Authorizing Provider  amoxicillin -clavulanate (AUGMENTIN ) 875-125 MG tablet Take 1 tablet by mouth every 12 (twelve) hours. 07/04/24  Yes Randol Simmonds, MD  albuterol  (PROVENTIL ) (2.5 MG/3ML) 0.083% nebulizer solution Inhale 3 mLs into the lungs every 4 (four) hours as needed for wheezing or shortness of breath. 03/26/24   Fleming, Zelda W, NP  albuterol  (VENTOLIN  HFA) 108 (90 Base) MCG/ACT inhaler Inhale 2 puffs into the lungs every 6 (six) hours as needed for wheezing or shortness of breath (when travelling). 09/07/23   Kara Dorn NOVAK, MD  benralizumab (FASENRA PEN) 30 MG/ML prefilled autoinjector Inject into the skin. Inject 30mg  into the skin at Week 0, Week 4, Week 8 then every 8 weeks thereafter    [provider]  budesonide  (PULMICORT ) 0.5 MG/2ML nebulizer solution Take 2 mLs (0.5 mg total) by nebulization 2 (two) times daily. 01/17/24   Kara Dorn NOVAK, MD  montelukast  (SINGULAIR ) 10 MG tablet Take 1 tablet (10 mg total) by mouth at bedtime. 03/26/24   Fleming, Zelda W, NP  predniSONE  (DELTASONE ) 1 MG tablet Take 1 tablet (1 mg total) by mouth daily with breakfast. 05/21/24   Kara Dorn NOVAK,  MD  umeclidinium-vilanterol (ANORO ELLIPTA ) 62.5-25 MCG/ACT AEPB Inhale 1 puff into the lungs daily. 01/17/24   Kara Dorn NOVAK, MD    Allergies: Patient has no known allergies.    Review of Systems  Respiratory:  Positive for cough and shortness of breath.     Updated Vital Signs BP 105/79 (BP Location: Right Arm)   Pulse 92   Temp 98.2 F (36.8 C) (Temporal)   Resp (!) 22   Ht 1.803 m (5' 11)   Wt 60.7 kg   SpO2 96%   BMI 18.66 kg/m   Physical Exam Vitals and nursing note reviewed.  Constitutional:      General: He is not in acute distress.    Appearance: He is well-developed.  HENT:     Head: Normocephalic and atraumatic.     Right Ear: External ear normal.     Left Ear: External ear normal.  Eyes:     General: No scleral icterus.       Right eye: No discharge.        Left eye: No discharge.     Conjunctiva/sclera: Conjunctivae normal.  Neck:     Trachea: No tracheal deviation.  Cardiovascular:     Rate and Rhythm: Normal rate and regular rhythm.  Pulmonary:     Effort: Pulmonary effort is normal. No respiratory distress.     Breath sounds: No stridor. Rales present. No wheezing.     Comments: Crackles in the left upper lobe Abdominal:  General: Bowel sounds are normal. There is no distension.     Palpations: Abdomen is soft.     Tenderness: There is no abdominal tenderness. There is no guarding or rebound.  Musculoskeletal:        General: No tenderness or deformity.     Cervical back: Neck supple.  Skin:    General: Skin is warm and dry.     Findings: No rash.  Neurological:     General: No focal deficit present.     Mental Status: He is alert.     Cranial Nerves: No cranial nerve deficit, dysarthria or facial asymmetry.     Sensory: No sensory deficit.     Motor: No abnormal muscle tone or seizure activity.     Coordination: Coordination normal.  Psychiatric:        Mood and Affect: Mood normal.     (all labs ordered are listed, but only  abnormal results are displayed) Labs Reviewed  BASIC METABOLIC PANEL WITH GFR - Abnormal; Notable for the following components:      Result Value   Sodium 131 (*)    Chloride 94 (*)    Glucose, Bld 128 (*)    All other components within normal limits  CBC - Abnormal; Notable for the following components:   Hemoglobin 12.4 (*)    HCT 37.0 (*)    Platelets 433 (*)    All other components within normal limits  RESP PANEL BY RT-PCR (RSV, FLU A&B, COVID)  [id number]    EKG: EKG Interpretation Date/Time:  Friday July 04 2024 04:28:52 EST Ventricular Rate:  106 PR Interval:  130 QRS Duration:  82 QT Interval:  340 QTC Calculation: 451 R Axis:   88  Text Interpretation: Sinus tachycardia Right atrial enlargement Borderline ECG Interpretation limited secondary to artifact Confirmed by Midge Golas ([id number]) on 07/04/2024 4:31:20 AM  Radiology: CT Chest W Contrast Result Date: 07/04/2024 EXAM: CT CHEST WITH CONTRAST 07/04/2024 06:26:11 AM TECHNIQUE: CT of the chest was performed with the administration of intravenous contrast. Multiplanar reformatted images are provided for review. Automated exposure control, iterative reconstruction, and/or weight based adjustment of the mA/kV was utilized to reduce the radiation dose to as low as reasonably achievable. COMPARISON: 02/19/2023 CLINICAL HISTORY: Pneumonia, complication suspected, xray done. FINDINGS: MEDIASTINUM: Heart and pericardium are unremarkable. The central airways are patent. LYMPH NODES: Prominent mediastinal and hilar lymph nodes are identified and appear increased from the previous exam, including a 1.5 cm subcarinal lymph node (image 72/3), a 1.4 cm prevascular lymph node within the anterior mediastinum (image 67/3), and a 1.2 cm right hilar lymph node (image 77/3). No axillary lymphadenopathy. LUNGS AND PLEURA: New patchy areas of consolidation are noted within the posterior right upper lobe and superior segment of left lower lobe. No  pulmonary edema. No pleural effusion or pneumothorax. Moderate bilateral cylindrical bronchiectasis, most notable within the lower lung zones. Filling defects within the dilated lower lobe airways are compatible with either mucous plugging or aspiration. Extensive tree-in-bud nodularity is noted throughout the lungs, as noted on the exam from 02/19/2023. The combination of prominent mediastinal and hilar lymph nodes, new patchy consolidation, tree-in-bud nodularity, and bronchiectasis with mucous plugging/aspiration is highly suggestive of an infectious process, such as complicated pneumonia, or other inflammatory conditions. Consideration should also be given to non-tuberculous mycobacterial infection or aspiration pneumonitis. Follow-up CT chest to assess response to treatment is recommended. Further workup for specific pathogens may be considered if clinical response is poor. SOFT TISSUES/BONES:  No acute abnormality of the bones or soft tissues. UPPER ABDOMEN: Limited images of the upper abdomen demonstrates no acute abnormality. IMPRESSION: 1. New patchy consolidation in the posterior right upper lobe and superior segment of the left lower lobe, most consistent with pneumonia; given associated tree-in-bud nodularity and airway mucus/debris, consider aspiration and atypical infection (including nontuberculous mycobacteria) in the differential. 2. Recommend follow-up chest radiograph in 6-8 weeks to document resolution. 3. Moderate bilateral cylindrical bronchiectasis, lower lobe predominant, with endobronchial filling defects compatible with mucous plugging or aspiration. 4. Extensive tree-in-bud nodularity throughout the lungs. 5. Prominent mediastinal and hilar lymph nodes, increased in size, favored reactive. Electronically signed by: Waddell Calk MD 07/04/2024 06:37 AM EST RP Workstation: [id number]   DG Chest 2 View Result Date: 07/04/2024 EXAM: 2 VIEW(S) XRAY OF THE CHEST 07/04/2024 04:19:00 AM  COMPARISON: None available. CLINICAL HISTORY: Cough FINDINGS: LUNGS AND PLEURA: Hyperinflation. Peribronchial thickening and diffuse interstitial thickening. Right upper lobe airspace opacity. Possible cavitary lesion in right upper lobe. Prominent interstitial opacities present throughout both lungs. No pleural effusion. No pneumothorax. HEART AND MEDIASTINUM: No acute abnormality of the cardiac and mediastinal silhouettes. BONES AND SOFT TISSUES: No acute osseous abnormality. IMPRESSION: 1. Right upper lobe airspace opacity with possible cavitation; consider CT chest for further evaluation as clinically indicated. 2. Prominent interstitial opacities throughout both lungs with peribronchial and diffuse interstitial thickening. 3. Hyperinflation. Electronically signed by: Evalene Coho MD 07/04/2024 05:12 AM EST RP Workstation: [id number]     Procedures   Medications Ordered in the ED  amoxicillin -clavulanate (AUGMENTIN ) 875-125 MG per tablet 1 tablet (has no administration in time range)  ipratropium-albuterol  (DUONEB) 0.5-2.5 (3) MG/3ML nebulizer solution 3 mL (3 mLs Nebulization Given 07/04/24 0424)  iohexol  (OMNIPAQUE ) 350 MG/ML injection 75 mL (75 mLs Intravenous Contrast Given 07/04/24 0626)    Clinical Course as of 07/04/24 1025  Fri Jul 04, 2024  0941 Basic metabolic panel(!) [JK]  [phone] Basic metabolic panel(!) Sodium level decreased glucose elevated [JK]  0941 Influenza B By PCR: NEGATIVE Negative for COVID flu and signs of [JK]  0941 DG Chest 2 View Chest x-ray shows possible cavitation [JK]  0942 Chest CT consistent with pneumonia [JK]    Clinical Course User Index [JK] Randol Simmonds, MD                                 Medical Decision Making Amount and/or Complexity of Data Reviewed Labs: ordered. Decision-making details documented in ED Course. Radiology: ordered. Decision-making details documented in ED Course.   Patient presented to the ED with complaints of increasing  shortness of breath.  Patient does have history of severe eosinophilic asthma.  Patient is followed by pulmonary critical care.  Patient has had Klebsiella pneumonia in the past.    Patient's laboratory tests did not show any significant anemia.  No leukocytosis.  Mild hyponatremia noted.  Patient's x-ray did show findings suggestive of a cavitary lesion.  Chest CT is suggestive of pneumonia.  Possible atypical etiology.  Discussed with PCCM as pt is followed in pulmonary clinic.  Will start him on Augmentin  for 14 days.  Follow up in pulm clnic     Final diagnoses:  Community acquired pneumonia, unspecified laterality    ED Discharge Orders          Ordered    amoxicillin -clavulanate (AUGMENTIN ) 875-125 MG tablet  Every 12 hours        07/04/24 1025  Randol Simmonds, MD 07/04/24 1026  "

## 2024-07-14 ENCOUNTER — Encounter (HOSPITAL_COMMUNITY): Payer: Self-pay | Admitting: *Deleted

## 2024-07-14 ENCOUNTER — Other Ambulatory Visit: Payer: Self-pay

## 2024-07-14 ENCOUNTER — Inpatient Hospital Stay (HOSPITAL_COMMUNITY)
Admission: EM | Admit: 2024-07-14 | Discharge: 2024-07-21 | DRG: 871 | Disposition: A | Discharge status: Home / Self Care | Payer: Self-pay | Attending: Family Medicine | Admitting: Family Medicine

## 2024-07-14 ENCOUNTER — Emergency Department (HOSPITAL_COMMUNITY): Payer: Self-pay

## 2024-07-14 DIAGNOSIS — J189 Pneumonia, unspecified organism: Secondary | ICD-10-CM | POA: Diagnosis present

## 2024-07-14 DIAGNOSIS — J8283 Eosinophilic asthma: Secondary | ICD-10-CM | POA: Diagnosis present

## 2024-07-14 DIAGNOSIS — R0902 Hypoxemia: Secondary | ICD-10-CM | POA: Diagnosis present

## 2024-07-14 DIAGNOSIS — B961 Klebsiella pneumoniae [K. pneumoniae] as the cause of diseases classified elsewhere: Secondary | ICD-10-CM | POA: Diagnosis present

## 2024-07-14 DIAGNOSIS — J441 Chronic obstructive pulmonary disease with (acute) exacerbation: Secondary | ICD-10-CM | POA: Diagnosis present

## 2024-07-14 DIAGNOSIS — L209 Atopic dermatitis, unspecified: Secondary | ICD-10-CM | POA: Diagnosis present

## 2024-07-14 DIAGNOSIS — Z888 Allergy status to other drugs, medicaments and biological substances status: Secondary | ICD-10-CM

## 2024-07-14 DIAGNOSIS — J4551 Severe persistent asthma with (acute) exacerbation: Secondary | ICD-10-CM | POA: Diagnosis present

## 2024-07-14 DIAGNOSIS — J159 Unspecified bacterial pneumonia: Secondary | ICD-10-CM | POA: Diagnosis present

## 2024-07-14 DIAGNOSIS — Z91014 Allergy to mammalian meats: Secondary | ICD-10-CM

## 2024-07-14 DIAGNOSIS — L299 Pruritus, unspecified: Secondary | ICD-10-CM | POA: Insufficient documentation

## 2024-07-14 DIAGNOSIS — R000 Tachycardia, unspecified: Secondary | ICD-10-CM

## 2024-07-14 DIAGNOSIS — J4489 Other specified chronic obstructive pulmonary disease: Secondary | ICD-10-CM

## 2024-07-14 DIAGNOSIS — R7303 Prediabetes: Secondary | ICD-10-CM | POA: Diagnosis present

## 2024-07-14 DIAGNOSIS — Z7952 Long term (current) use of systemic steroids: Secondary | ICD-10-CM

## 2024-07-14 DIAGNOSIS — Z825 Family history of asthma and other chronic lower respiratory diseases: Secondary | ICD-10-CM

## 2024-07-14 DIAGNOSIS — E871 Hypo-osmolality and hyponatremia: Secondary | ICD-10-CM | POA: Diagnosis present

## 2024-07-14 DIAGNOSIS — J440 Chronic obstructive pulmonary disease with acute lower respiratory infection: Secondary | ICD-10-CM | POA: Diagnosis present

## 2024-07-14 DIAGNOSIS — Z681 Body mass index (BMI) 19 or less, adult: Secondary | ICD-10-CM

## 2024-07-14 DIAGNOSIS — R578 Other shock: Secondary | ICD-10-CM | POA: Diagnosis not present

## 2024-07-14 DIAGNOSIS — J470 Bronchiectasis with acute lower respiratory infection: Secondary | ICD-10-CM | POA: Diagnosis present

## 2024-07-14 DIAGNOSIS — D75839 Thrombocytosis, unspecified: Secondary | ICD-10-CM | POA: Diagnosis present

## 2024-07-14 DIAGNOSIS — Z7901 Long term (current) use of anticoagulants: Secondary | ICD-10-CM

## 2024-07-14 DIAGNOSIS — I959 Hypotension, unspecified: Secondary | ICD-10-CM | POA: Diagnosis present

## 2024-07-14 DIAGNOSIS — G4733 Obstructive sleep apnea (adult) (pediatric): Secondary | ICD-10-CM | POA: Diagnosis present

## 2024-07-14 DIAGNOSIS — Z7951 Long term (current) use of inhaled steroids: Secondary | ICD-10-CM

## 2024-07-14 DIAGNOSIS — D649 Anemia, unspecified: Secondary | ICD-10-CM | POA: Diagnosis present

## 2024-07-14 DIAGNOSIS — R053 Chronic cough: Secondary | ICD-10-CM | POA: Diagnosis present

## 2024-07-14 DIAGNOSIS — Z79899 Other long term (current) drug therapy: Secondary | ICD-10-CM

## 2024-07-14 DIAGNOSIS — R066 Hiccough: Secondary | ICD-10-CM | POA: Diagnosis present

## 2024-07-14 DIAGNOSIS — J471 Bronchiectasis with (acute) exacerbation: Secondary | ICD-10-CM | POA: Diagnosis present

## 2024-07-14 DIAGNOSIS — E43 Unspecified severe protein-calorie malnutrition: Secondary | ICD-10-CM | POA: Diagnosis present

## 2024-07-14 DIAGNOSIS — Z789 Other specified health status: Secondary | ICD-10-CM

## 2024-07-14 DIAGNOSIS — A419 Sepsis, unspecified organism: Principal | ICD-10-CM | POA: Diagnosis present

## 2024-07-14 DIAGNOSIS — J479 Bronchiectasis, uncomplicated: Secondary | ICD-10-CM

## 2024-07-14 LAB — I-STAT VENOUS BLOOD GAS, ED
Acid-Base Excess: 5 mmol/L — ABNORMAL HIGH (ref 0.0–2.0)
Bicarbonate: 30.3 mmol/L — ABNORMAL HIGH (ref 20.0–28.0)
Calcium, Ion: 1.13 mmol/L — ABNORMAL LOW (ref 1.15–1.40)
HCT: 45 % (ref 39.0–52.0)
Hemoglobin: 15.3 g/dL (ref 13.0–17.0)
O2 Saturation: 80 %
Potassium: 3.9 mmol/L (ref 3.5–5.1)
Sodium: 135 mmol/L (ref 135–145)
TCO2: 32 mmol/L (ref 22–32)
pCO2, Ven: 46.8 mmHg (ref 44–60)
pH, Ven: 7.418 (ref 7.25–7.43)
pO2, Ven: 45 mmHg (ref 32–45)

## 2024-07-14 LAB — URINALYSIS, ROUTINE W REFLEX MICROSCOPIC
Bilirubin Urine: NEGATIVE
Glucose, UA: NEGATIVE mg/dL
Hgb urine dipstick: NEGATIVE
Ketones, ur: NEGATIVE mg/dL
Leukocytes,Ua: NEGATIVE
Nitrite: NEGATIVE
Protein, ur: NEGATIVE mg/dL
Specific Gravity, Urine: 1.005 (ref 1.005–1.030)
pH: 6 (ref 5.0–8.0)

## 2024-07-14 LAB — BASIC METABOLIC PANEL WITH GFR
Anion gap: 8 (ref 5–15)
BUN: 14 mg/dL (ref 6–20)
CO2: 30 mmol/L (ref 22–32)
Calcium: 9 mg/dL (ref 8.9–10.3)
Chloride: 95 mmol/L — ABNORMAL LOW (ref 98–111)
Creatinine, Ser: 0.97 mg/dL (ref 0.61–1.24)
GFR, Estimated: >60 mL/min
Glucose, Bld: 105 mg/dL — ABNORMAL HIGH (ref 70–99)
Potassium: 4.3 mmol/L (ref 3.5–5.1)
Sodium: 133 mmol/L — ABNORMAL LOW (ref 135–145)

## 2024-07-14 LAB — CBC
HCT: 43.8 % (ref 39.0–52.0)
Hemoglobin: 14.3 g/dL (ref 13.0–17.0)
MCH: 28.1 pg (ref 26.0–34.0)
MCHC: 32.6 g/dL (ref 30.0–36.0)
MCV: 86.1 fL (ref 80.0–100.0)
Platelets: 452 K/uL — ABNORMAL HIGH (ref 150–400)
RBC: 5.09 MIL/uL (ref 4.22–5.81)
RDW: 14.6 % (ref 11.5–15.5)
WBC: 9.5 K/uL (ref 4.0–10.5)
nRBC: 0 % (ref 0.0–0.2)

## 2024-07-14 LAB — RESP PANEL BY RT-PCR (RSV, FLU A&B, COVID)  RVPGX2
Influenza A by PCR: NEGATIVE
Influenza B by PCR: NEGATIVE
Resp Syncytial Virus by PCR: NEGATIVE
SARS Coronavirus 2 by RT PCR: NEGATIVE

## 2024-07-14 LAB — HEPATIC FUNCTION PANEL
ALT: 42 U/L (ref 0–44)
AST: 34 U/L (ref 15–41)
Albumin: 3.2 g/dL — ABNORMAL LOW (ref 3.5–5.0)
Alkaline Phosphatase: 75 U/L (ref 38–126)
Bilirubin, Direct: <0.1 mg/dL (ref 0.0–0.2)
Total Bilirubin: 0.4 mg/dL (ref 0.0–1.2)
Total Protein: 7.6 g/dL (ref 6.5–8.1)

## 2024-07-14 LAB — HEMOGLOBIN A1C
Hgb A1c MFr Bld: 6.4 % — ABNORMAL HIGH (ref 4.8–5.6)
Mean Plasma Glucose: 136.98 mg/dL

## 2024-07-14 LAB — CBG MONITORING, ED: Glucose-Capillary: 168 mg/dL — ABNORMAL HIGH (ref 70–99)

## 2024-07-14 LAB — PROTIME-INR
INR: 1 (ref 0.8–1.2)
Prothrombin Time: 13.9 s (ref 11.4–15.2)

## 2024-07-14 LAB — I-STAT CG4 LACTIC ACID, ED: Lactic Acid, Venous: 1.7 mmol/L (ref 0.5–1.9)

## 2024-07-14 MED ORDER — UMECLIDINIUM-VILANTEROL 62.5-25 MCG/ACT IN AEPB
1.0000 | INHALATION_SPRAY | Freq: Every day | RESPIRATORY_TRACT | Status: DC
  Administered 2024-07-15 – 2024-07-21 (×5): 1 via RESPIRATORY_TRACT
  Filled 2024-07-14: qty 14

## 2024-07-14 MED ORDER — SODIUM CHLORIDE 0.9 % IV SOLN
2.0000 g | Freq: Three times a day (TID) | INTRAVENOUS | Status: DC
  Administered 2024-07-14 – 2024-07-16 (×6): 2 g via INTRAVENOUS
  Filled 2024-07-14 (×6): qty 12.5

## 2024-07-14 MED ORDER — LACTATED RINGERS IV BOLUS
1000.0000 mL | Freq: Once | INTRAVENOUS | Status: AC
  Administered 2024-07-14: 1000 mL via INTRAVENOUS

## 2024-07-14 MED ORDER — ALBUTEROL SULFATE (2.5 MG/3ML) 0.083% IN NEBU
2.5000 mg | INHALATION_SOLUTION | Freq: Four times a day (QID) | RESPIRATORY_TRACT | Status: DC | PRN
  Administered 2024-07-14 – 2024-07-16 (×4): 2.5 mg via RESPIRATORY_TRACT
  Filled 2024-07-14 (×5): qty 3

## 2024-07-14 MED ORDER — SODIUM CHLORIDE 0.9 % IV SOLN
2.0000 g | Freq: Once | INTRAVENOUS | Status: AC
  Administered 2024-07-14: 2 g via INTRAVENOUS
  Filled 2024-07-14: qty 12.5

## 2024-07-14 MED ORDER — MONTELUKAST SODIUM 10 MG PO TABS
10.0000 mg | ORAL_TABLET | Freq: Every day | ORAL | Status: DC
  Administered 2024-07-14 – 2024-07-20 (×7): 10 mg via ORAL
  Filled 2024-07-14 (×7): qty 1

## 2024-07-14 MED ORDER — IPRATROPIUM-ALBUTEROL 0.5-2.5 (3) MG/3ML IN SOLN
3.0000 mL | Freq: Once | RESPIRATORY_TRACT | Status: AC
  Administered 2024-07-14: 3 mL via RESPIRATORY_TRACT
  Filled 2024-07-14: qty 3

## 2024-07-14 MED ORDER — METHYLPREDNISOLONE SODIUM SUCC 125 MG IJ SOLR
125.0000 mg | Freq: Once | INTRAMUSCULAR | Status: AC
  Administered 2024-07-14: 125 mg via INTRAVENOUS
  Filled 2024-07-14: qty 2

## 2024-07-14 MED ORDER — ENOXAPARIN SODIUM 40 MG/0.4ML IJ SOSY
40.0000 mg | PREFILLED_SYRINGE | INTRAMUSCULAR | Status: DC
  Administered 2024-07-14 – 2024-07-15 (×2): 40 mg via SUBCUTANEOUS
  Filled 2024-07-14 (×2): qty 0.4

## 2024-07-14 MED ORDER — PREDNISONE 1 MG PO TABS: 1.0000 mg | ORAL_TABLET | Freq: Every day | ORAL | Status: DC

## 2024-07-14 MED ORDER — PREDNISONE 20 MG PO TABS: 10.0000 mg | ORAL_TABLET | Freq: Every day | ORAL | Status: DC

## 2024-07-14 MED ORDER — VANCOMYCIN HCL 1250 MG/250ML IV SOLN
1250.0000 mg | Freq: Once | INTRAVENOUS | Status: AC
  Administered 2024-07-14: 1250 mg via INTRAVENOUS
  Filled 2024-07-14: qty 250

## 2024-07-14 MED ORDER — PREDNISONE 1 MG PO TABS
1.0000 mg | ORAL_TABLET | Freq: Every day | ORAL | Status: DC
  Administered 2024-07-15 – 2024-07-21 (×7): 1 mg via ORAL
  Filled 2024-07-14 (×9): qty 1

## 2024-07-14 MED ORDER — IPRATROPIUM-ALBUTEROL 0.5-2.5 (3) MG/3ML IN SOLN: 3.0000 mL | RESPIRATORY_TRACT | Status: DC | PRN

## 2024-07-14 NOTE — Plan of Care (Signed)
 [facility]MT[facility] Brief Progress Note  S:Patient was seen and examined at bedside by myself and Dr. Stoney. Patient states he feels a little better than on admission. Denies any hiccups. Been eating and drinking without difficulty. Lying comfortably in hospital bed, conversing with friend at bedside.   O: BP 100/69   Pulse (!) 102   Temp 98.8 F (37.1 C) (Oral)   Resp (!) 30   Ht 5' 11 (1.803 m)   Wt 55.8 kg   SpO2 99%   BMI 17.16 kg/m   General: A&O, NAD HEENT: No sign of trauma, EOM grossly intact Cardiac: tachycardic, no m/r/g Respiratory: mild increased WOB on RA with O2 sats 100, coarse cough on deep inspiration, diffuse wheezes and scattered ronchi throughout, speaks clear full sentences without difficulty GI:  non-distended  Extremities: NTTP, no peripheral edema. Neuro: Normal gait, moves all four extremities appropriately. Psych: Appropriate mood and affect   A/P: Plan as per H&P including the following: - Albuterol  nebulizer solution now - Orders reviewed. Labs for AM ordered, which was adjusted as needed.  - If condition changes, plan includes contact [facility]MTS.   Lupie Credit, DO 07/14/2024, 9:15 PM PGY-1,  Family Medicine Night Resident  Please page [phone] with questions.

## 2024-07-14 NOTE — ED Notes (Signed)
 Pt needing to use the bathroom. Pt unhooked from monitor to go. Pt states he has not received a meal tray. Nutrition called. Pt does not have a meal tray coming. Informed nutrition services to send patient a tray. Pt will be updated.

## 2024-07-14 NOTE — Consult Note (Signed)
 "  NAME:  Shane Fleming, MRN:  [id number], DOB:  [birth year], LOS: 0 ADMISSION DATE:  07/14/2024, CONSULTATION DATE:  07/14/2024 REFERRING MD:  Family Medicine, CHIEF COMPLAINT:  cough   History of Present Illness:  54 year old woman history of asthma with eosinophilia lost access to Dupixent  with subsequently worsening disease and symptoms, bronchiectasis with acute on chronic cough.  Seen in clinic 05/2024.  Prednisone  taper.  Worsening cough for last 2 weeks.  Presented to ED 07/05/2023.  CT chest showed chronic bronchiectasis but new posterior right sided infiltrates concerning for pneumonia.  Placed on Augmentin  based on prior culture data.  He tells me he septally improved.  Cough is better than it was 1 2 weeks ago.  But still persistent cough.  This prompted presentation to the ED.  Chest x-ray looks unchanged to my eye, chronic infiltrates concerning for NTM disease and bronchiectatic changes.  Pertinent  Medical History  Severe persistent asthma with eosinophilic features, bronchiectasis  Significant Hospital Events: Including procedures, antibiotic start and stop dates in addition to other pertinent events     Interim History / Subjective:    Objective    Blood pressure 111/75, pulse (!) 105, temperature 98.7 F (37.1 C), resp. rate 18, height 5' 11 (1.803 m), weight 55.8 kg, SpO2 97%.        Intake/Output Summary (Last 24 hours) at 07/14/2024 1503 Last data filed at 07/14/2024 1117 Gross per 24 hour  Intake 250 ml  Output --  Net 250 ml   Filed Weights   07/14/24 0603  Weight: 55.8 kg    Examination: General: Sitting up in stretcher no distress HENT: Atraumatic normocephalic Lungs: Normal work of breathing on room air regular Cardiovascular: Regular rate and rhythm Abdomen: Nondistended  Resolved problem list   Assessment and Plan   Acute on chronic cough: Related to underlying poorly controlled asthma as well as bronchiectasis.  Recently improved with course of  Augmentin .  Possibly back to baseline. -- Airway clearance ordered  Bronchiectasis with exacerbation: Recent CT scan 1/2 with new infiltrates.  Sounds like antibiotic improved with Augmentin . -- Continue cefepime , lower respiratory culture and AFB culture ordered to help tailor antibiotics now in the future  Severe persistent asthma with eosinophilia: Lost access to IL-5 therapy, biologic with worsening symptoms. -- Continue low-dose prednisone  for now, continue Anoro, continue Singulair   PCCM will follow  Labs   CBC: Recent Labs  Lab 07/14/24 0613 07/14/24 0736  WBC 9.5  --   HGB 14.3 15.3  HCT 43.8 45.0  MCV 86.1  --   PLT 452*  --     Basic Metabolic Panel: Recent Labs  Lab 07/14/24 0613 07/14/24 0736  NA 133* 135  K 4.3 3.9  CL 95*  --   CO2 30  --   GLUCOSE 105*  --   BUN 14  --   CREATININE 0.97  --   CALCIUM  9.0  --    GFR: Estimated Creatinine Clearance: 67.1 mL/min (by C-G formula based on SCr of 0.97 mg/dL). Recent Labs  Lab 07/14/24 0613 07/14/24 0736  WBC 9.5  --   LATICACIDVEN  --  1.7    Liver Function Tests: Recent Labs  Lab 07/14/24 0701  AST 34  ALT 42  ALKPHOS 75  BILITOT 0.4  PROT 7.6  ALBUMIN 3.2*   No results for input(s): LIPASE, AMYLASE in the last 168 hours. No results for input(s): AMMONIA in the last 168 hours.  ABG    Component Value  Date/Time   PHART 7.387 02/11/2021 1146   PCO2ART 52.7 (H) 02/11/2021 1146   PO2ART 45 (L) 02/11/2021 1146   HCO3 30.3 (H) 07/14/2024 0736   TCO2 32 07/14/2024 0736   O2SAT 80 07/14/2024 0736     Coagulation Profile: Recent Labs  Lab 07/14/24 0701  INR 1.0    Cardiac Enzymes: No results for input(s): CKTOTAL, CKMB, CKMBINDEX, TROPONINI in the last 168 hours.  HbA1C: HbA1c, POC (prediabetic range)  Date/Time Value Ref Range Status  08/18/2022 10:04 AM 6.2 5.7 - 6.4 % Final  02/15/2022 11:36 AM 6.0 5.7 - 6.4 % Final   HbA1c, POC (controlled diabetic range)   Date/Time Value Ref Range Status  09/19/2023 09:34 AM 6.3 0.0 - 7.0 % Final   Hgb A1c MFr Bld  Date/Time Value Ref Range Status  07/14/2024 06:13 AM 6.4 (H) 4.8 - 5.6 % Final    Comment:    (NOTE) Diagnosis of Diabetes The following HbA1c ranges recommended by the American Diabetes Association (ADA) may be used as an aid in the diagnosis of diabetes mellitus.  Hemoglobin             Suggested A1C NGSP%              Diagnosis  <5.7                   Non Diabetic  5.7-6.4                Pre-Diabetic  >6.4                   Diabetic  <7.0                   Glycemic control for                       adults with diabetes.    03/26/2024 11:53 AM 6.1 (H) 4.8 - 5.6 % Final    Comment:             Prediabetes: 5.7 - 6.4          Diabetes: >6.4          Glycemic control for adults with diabetes: <7.0     CBG: No results for input(s): GLUCAP in the last 168 hours.  Review of Systems:   Comprehensive review of system is negative unless mentioned in HPI above  Past Medical History:  He,  has a past medical history of Asthma, Prediabetes, and Sinusitis (2018).   Surgical History:   Past Surgical History:  Procedure Laterality Date   APPENDECTOMY     BRONCHIAL WASHINGS  02/17/2021   Procedure: BRONCHIAL WASHINGS;  Surgeon: Kara Dorn NOVAK, MD;  Location: [facility] ENDOSCOPY;  Service: Pulmonary;;   LAPAROSCOPIC APPENDECTOMY N/A 04/08/2015   Procedure: APPENDECTOMY LAPAROSCOPIC;  Surgeon: Morene Olives, MD;  Location: [facility] ORS;  Service: General;  Laterality: N/A;   VIDEO BRONCHOSCOPY N/A 02/17/2021   Procedure: VIDEO BRONCHOSCOPY WITHOUT FLUORO;  Surgeon: Kara Dorn NOVAK, MD;  Location: [facility] ENDOSCOPY;  Service: Pulmonary;  Laterality: N/A;     Social History:   reports that he has never smoked. He has never been exposed to tobacco smoke. He has never used smokeless tobacco. He reports that he does not drink alcohol and does not use drugs.   Family History:  His family  history includes Asthma in his father.   Allergies Allergies[1]   Home Medications  Prior to Admission  medications  Medication Sig Start Date End Date Taking? Authorizing Provider  albuterol  (PROVENTIL ) (2.5 MG/3ML) 0.083% nebulizer solution Inhale 3 mLs into the lungs every 4 (four) hours as needed for wheezing or shortness of breath. 03/26/24  Yes Theotis Haze ORN, NP  albuterol  (VENTOLIN  HFA) 108 (90 Base) MCG/ACT inhaler Inhale 2 puffs into the lungs every 6 (six) hours as needed for wheezing or shortness of breath (when travelling). 09/07/23  Yes Kara Dorn NOVAK, MD  amoxicillin -clavulanate (AUGMENTIN ) 875-125 MG tablet Take 1 tablet by mouth every 12 (twelve) hours. 07/04/24  Yes Randol Simmonds, MD  budesonide  (PULMICORT ) 0.5 MG/2ML nebulizer solution Take 2 mLs (0.5 mg total) by nebulization 2 (two) times daily. Patient not taking: Reported on 07/14/2024 01/17/24   Kara Dorn NOVAK, MD  montelukast  (SINGULAIR ) 10 MG tablet Take 1 tablet (10 mg total) by mouth at bedtime. Patient not taking: Reported on 07/14/2024 03/26/24   Fleming, Zelda W, NP  predniSONE  (DELTASONE ) 1 MG tablet Take 1 tablet (1 mg total) by mouth daily with breakfast. Patient not taking: Reported on 07/14/2024 05/21/24   Kara Dorn NOVAK, MD  umeclidinium-vilanterol (ANORO ELLIPTA ) 62.5-25 MCG/ACT AEPB Inhale 1 puff into the lungs daily. Patient not taking: Reported on 07/14/2024 01/17/24   Kara Dorn NOVAK, MD     Critical care time:     Donnice JONELLE Beals, MD See TRACEY          [1]  Allergies Allergen Reactions   Porcine (Pork) Protein-Containing Drug Products    "

## 2024-07-14 NOTE — Assessment & Plan Note (Signed)
 Most likely his current presentation is secondary to a combination of incompletely treated pneumonia plus his underlying asthma and bronchiectasis.  His work of breathing has improved with DuoNebs, however he did note that hiccuping started when he began taking the amoxicillin . - Admit to inpatient, FM TS, Dr. Anders attending - Med/tele floor - Continuous cardiac monitoring x 12 h - Cefepime  2 g every 8 hours, s/p 1 dose in ED - S/p 1 dose vancomycin  - Continue home Ellipta and albuterol  inhalers - Consult to [facility] pulmonology, appreciate recommendations - Vitals per floor - AM BMP and CBC

## 2024-07-14 NOTE — ED Triage Notes (Signed)
 C/o cough and sob with hiscups onset Friday , c/o productive cough

## 2024-07-14 NOTE — ED Notes (Signed)
 Pt given saltine crackers, 2 apple juice, and 1 peanut butter. Pt states he does not eat pork. Nutrition called and made aware patient does not eat pork. Per nutrition patient meal tray that is currently being processed does not have any pork on it.

## 2024-07-14 NOTE — ED Notes (Addendum)
 Called nutrition services to follow-up on patients tray. Pt tray to be delivered at 5:15pm. Pt given 2 apple juice. Offered patient a sandwich. Pt reports he does not want one at this time because he wants to wait for his meal tray.

## 2024-07-14 NOTE — ED Notes (Addendum)
 1740: Pt reports not receiving meal tray. Nutrition services called. Pt offered a turkey sandwich. Pt does not want one at this time and wants to wait.   1745: Pt received meal tray while this RN was on the phone with nutrition services. This RN apologized to the patient for the delay in receiving his meal tray.

## 2024-07-14 NOTE — ED Notes (Signed)
 Pt informed RN that he did not receive a meal tray. Nutrition services called to ask about patient lunch tray. Pt lunch tray was missed. Nutrition services to put a rush on patients tray.

## 2024-07-14 NOTE — H&P (Signed)
 "    Hospital Admission History and Physical Service Pager: [phone]  Patient name: Shane Fleming Medical record number: [id number] Date of Birth: 12-Mar-1971 Age: 54 y.o. Gender: male  Primary Care Provider: Theotis Haze ORN, NP Consultants: Pulmonology Code Status: Full Code Preferred Emergency Contact:  Contact Information     Name Relation Home Work [place] Other [phone]        Other Contacts   None on File      Chief Complaint: Cough and SOB  Assessment and Plan: Shane Fleming is a 54 y.o. male presenting with cough and SOB in the setting of known eosinophilic asthma and recent treatment for PNA . Differential for presentation of this includes  Bacterial pneumonia- likely given patient has had persistent symptoms and x-ray shows continued opacity in the LLL Asthma Exacerbation- patient presents with 24 hours of hiccuping, which often accompany his asthma flares.   Viral pneumonia-unlikely given duration of symptoms and negative RPP  Assessment & Plan Pneumonia Eosinophilic asthma Bronchiectasis (HCC) Most likely his current presentation is secondary to a combination of incompletely treated pneumonia plus his underlying asthma and bronchiectasis.  His work of breathing has improved with DuoNebs, however he did note that hiccuping started when he began taking the amoxicillin . - Admit to inpatient, FM TS, Dr. Anders attending - Med/tele floor - Continuous cardiac monitoring x 12 h - Cefepime  2 g every 8 hours, s/p 1 dose in ED - S/p 1 dose vancomycin  - Continue home Ellipta and albuterol  inhalers - Consult to [facility] pulmonology, appreciate recommendations - Vitals per floor - AM BMP and CBC Prediabetes Diet controlled, last A1c in September 2024 was 6.1 - Repeat A1c - Carb modified diet - CBGs 3 times daily AC, consider interventions if appropriate while on steroids  FEN/GI: Carb modified VTE Prophylaxis: Lovenox   Disposition:  Med/tele  History of Present Illness:  Shane Fleming is a 54 y.o. male presenting with 10-day history of cough, congestion, shortness of breath and fever.  He was initially seen in the emergency department on January 2 and diagnosed with community-acquired pneumonia.  He was treated with 14-day course of Augmentin  and instructed to follow-up with his pulmonologist.  Patient reports that 24 hours after starting Augmentin  he started hiccuping which is often a sign of his eosinophilic asthma flaring.  When it did not stop after 10 days he discontinued the medication.  At that time his breathing got worse and he started coughing again which is what prompted him to return to the emergency department.  In the ED, vital signs showed tachycardia and tachypnea with low oxygen saturations at 90% on room air.  He was given DuoNebs x 3 as well as Solu-Medrol  with significant improvement in work of breathing and oxygen saturation.  He was also given a 1 L bolus of LR and started on cefepime  and vancomycin  for presumed treatment failed pneumonia.  CBC is unremarkable except for mild anemia at 12.4, BMP with mildly decreased sodium of 131.  RVP and UA were negative, blood cultures were drawn x 2.  Review Of Systems: Per HPI   Pertinent Past Medical History: Eosinophilic Asthma Pre-diabetes- diet controlled  Remainder reviewed in history tab.   Pertinent Past Surgical History: Bronchoscopy Appendectomy   Remainder reviewed in history tab.  Pertinent Social History: Tobacco use: No Alcohol use: None Other Substance use: None Lives with self  Pertinent Family History: Father- Asthma  Remainder reviewed in history tab.   Important Outpatient Medications: Fasenra Pulmicort   Anoro- ellipta Albuterol  Singulair   Remainder reviewed in medication history.   Objective: BP 93/70   Pulse (!) 107   Temp (!) 97.5 F (36.4 C) (Oral)   Resp (!) 38   Ht 5' 11 (1.803 m)   Wt 55.8 kg   SpO2 93%   BMI  17.16 kg/m  Exam: General: Ill-appearing, no distress Eyes: PERRLA, EOMI ENTM: Moist mucous membranes, erythema to the back of the throat but no swelling or exudate of the tonsils.  Tongue is midline, intact dentition Neck: Supple, nontender Cardiovascular: Tachycardic, no M/R/G Respiratory: Appropriate effort, mild end expiratory wheezing bilaterally.  Coarse crackles heard over left lower lung base Gastrointestinal: Soft, nontender.  Bowel sounds present x 4 MSK: All 4 limbs warm and well-perfused, no signs of peripheral edema Derm: Skin around the lips is dry and cracked as well as some flaking patches on the patient's bilateral cheeks  Labs:  CBC BMET  Recent Labs  Lab 07/14/24 0613 07/14/24 0736  WBC 9.5  --   HGB 14.3 15.3  HCT 43.8 45.0  PLT 452*  --    Recent Labs  Lab 07/14/24 0613 07/14/24 0736  NA 133* 135  K 4.3 3.9  CL 95*  --   CO2 30  --   BUN 14  --   CREATININE 0.97  --   GLUCOSE 105*  --   CALCIUM  9.0  --      EKG: Sinus tachycardia, P waves enlarged in II, III and aVF, possible right atrial enlargement.     Imaging Studies Performed:  Chest x-ray Impression from Radiologist: IMPRESSION: 1. Diffuse interstitial thickening with diffuse reticular and nodular opacities , as well as diffuse bronchiectasis and bronchial wall thickening. Findings are favored to represent sequelae of chronic, indolent inflammation or atypical infection such as MAI. 2. Persistent right upper lobe and superior segment of left lower lobe opacities. Compatible with superimposed acute pneumonia. 3. Hyperinflation.   My Interpretation: Diffuse infiltrates noted bilaterally in both lungs, area of opacity in the left middle lung field consistent with pneumonia, appears enlarged from study done on January 2.   Cleotilde Lukes, DO 07/14/2024, 9:51 AM PGY-2, [facility]  FPTS Intern pager: [phone], text pages welcome Secure chat group [facility] Teaching Service  "

## 2024-07-14 NOTE — Assessment & Plan Note (Signed)
 Diet controlled, last A1c in September 2024 was 6.1 - Repeat A1c - Carb modified diet - CBGs 3 times daily AC, consider interventions if appropriate while on steroids

## 2024-07-14 NOTE — ED Provider Notes (Signed)
 " [facility] EM[facility] DE[facility]MENT AT [facility] Provider Note   CSN: [id number] Arrival date & time: 07/14/24  9450     Patient presents with: Shortness of Breath   Shane Fleming is a 54 y.o. male.  Shortness of Breath Associated symptoms: cough   Patient is a 54 year old male presenting to the ED today for worsening shortness of breath, productive cough, hiccups that has been ongoing x 10 days, noting mild improvement with initial Augmentin  but had worsening symptoms starting 3 days ago after stopping abx secondary to hiccups which he thought were caused by the abx.  Noting that his shortness of breath has been worsening particularly on exertion.  Was supposed to follow-up with pulmonology but has not been able to do so yet.  Known history of severe eosinophilic asthma, bronchiectasis, prediabetes.  Followed by pulmonology clinic.  Reports some mild generalized abdominal pain.  Denies fever, headaches, neck stiffness, blurry vision, chest pain, nausea, vomiting, diarrhea, melena, hematochezia, dysuria, hematuria, rashes, lower leg swelling.     Prior to Admission medications  Medication Sig Start Date End Date Taking? Authorizing Provider  albuterol  (PROVENTIL ) (2.5 MG/3ML) 0.083% nebulizer solution Inhale 3 mLs into the lungs every 4 (four) hours as needed for wheezing or shortness of breath. 03/26/24   Fleming, Zelda W, NP  albuterol  (VENTOLIN  HFA) 108 (90 Base) MCG/ACT inhaler Inhale 2 puffs into the lungs every 6 (six) hours as needed for wheezing or shortness of breath (when travelling). 09/07/23   Kara Dorn NOVAK, MD  amoxicillin -clavulanate (AUGMENTIN ) 875-125 MG tablet Take 1 tablet by mouth every 12 (twelve) hours. 07/04/24   Randol Simmonds, MD  benralizumab [facility] PEN) 30 MG/ML prefilled autoinjector Inject into the skin. Inject 30mg  into the skin at Week 0, Week 4, Week 8 then every 8 weeks thereafter    [provider]  budesonide  (PULMICORT ) 0.5 MG/2ML  nebulizer solution Take 2 mLs (0.5 mg total) by nebulization 2 (two) times daily. 01/17/24   Kara Dorn NOVAK, MD  montelukast  (SINGULAIR ) 10 MG tablet Take 1 tablet (10 mg total) by mouth at bedtime. 03/26/24   Fleming, Zelda W, NP  predniSONE  (DELTASONE ) 1 MG tablet Take 1 tablet (1 mg total) by mouth daily with breakfast. 05/21/24   Kara Dorn NOVAK, MD  umeclidinium-vilanterol (ANORO ELLIPTA ) 62.5-25 MCG/ACT AEPB Inhale 1 puff into the lungs daily. 01/17/24   Kara Dorn NOVAK, MD    Allergies: Patient has no known allergies.    Review of Systems  Respiratory:  Positive for cough and shortness of breath.   All other systems reviewed and are negative.   Updated Vital Signs BP 93/70   Pulse (!) 107   Temp (!) 97.5 F (36.4 C) (Oral)   Resp (!) 38   Ht 5' 11 (1.803 m)   Wt 55.8 kg   SpO2 93%   BMI 17.16 kg/m   Physical Exam Vitals and nursing note reviewed.  Constitutional:      General: He is not in acute distress.    Appearance: Normal appearance. He is ill-appearing. He is not diaphoretic.  HENT:     Head: Normocephalic and atraumatic.  Eyes:     General: No scleral icterus.       Right eye: No discharge.        Left eye: No discharge.     Extraocular Movements: Extraocular movements intact.     Conjunctiva/sclera: Conjunctivae normal.  Cardiovascular:     Rate and Rhythm: Regular rhythm. Tachycardia present.  Pulses: Normal pulses.     Heart sounds: Normal heart sounds. No murmur heard.    No friction rub. No gallop.  Pulmonary:     Effort: Tachypnea present.     Breath sounds: No stridor. Examination of the right-middle field reveals wheezing. Examination of the left-middle field reveals wheezing. Examination of the right-lower field reveals wheezing. Examination of the left-lower field reveals wheezing and rales. Wheezing and rales present. No rhonchi.  Chest:     Chest wall: No tenderness.  Abdominal:     General: Abdomen is flat. There is no distension.      Palpations: Abdomen is soft.     Tenderness: There is no abdominal tenderness. There is no right CVA tenderness, left CVA tenderness, guarding or rebound.  Musculoskeletal:        General: No swelling, deformity or signs of injury.     Cervical back: Normal range of motion. No rigidity.     Right lower leg: No edema.     Left lower leg: No edema.  Skin:    General: Skin is warm and dry.     Findings: No bruising, erythema or lesion.  Neurological:     General: No focal deficit present.     Mental Status: He is alert and oriented to person, place, and time. Mental status is at baseline.     Sensory: No sensory deficit.     Motor: No weakness.  Psychiatric:        Mood and Affect: Mood normal.     (all labs ordered are listed, but only abnormal results are displayed) Labs Reviewed  BASIC METABOLIC PANEL WITH GFR - Abnormal; Notable for the following components:      Result Value   Sodium 133 (*)    Chloride 95 (*)    Glucose, Bld 105 (*)    All other components within normal limits  CBC - Abnormal; Notable for the following components:   Platelets 452 (*)    All other components within normal limits  URINALYSIS, ROUTINE W REFLEX MICROSCOPIC - Abnormal; Notable for the following components:   Color, Urine STRAW (*)    All other components within normal limits  HEPATIC FUNCTION PANEL - Abnormal; Notable for the following components:   Albumin 3.2 (*)    All other components within normal limits  I-STAT VENOUS BLOOD GAS, ED - Abnormal; Notable for the following components:   Bicarbonate 30.3 (*)    Acid-Base Excess 5.0 (*)    Calcium , Ion 1.13 (*)    All other components within normal limits  RESP PANEL BY RT-PCR (RSV, FLU A&B, COVID)  RVPGX2  CULTURE, BLOOD (ROUTINE X 2)  CULTURE, BLOOD (ROUTINE X 2)  MRSA NEXT GEN BY PCR, NASAL  PROTIME-INR  I-STAT CG4 LACTIC ACID, ED    EKG: EKG Interpretation Date/Time:  Monday July 14 2024 06:12:32 EST Ventricular Rate:   122 PR Interval:  124 QRS Duration:  86 QT Interval:  326 QTC Calculation: 464 R Axis:   82  Text Interpretation: Sinus tachycardia Right atrial enlargement Cannot rule out Anterior infarct , age undetermined Abnormal ECG No significant change since last tracing Confirmed by Ellouise Fine ([id number]) on 07/14/2024 7:27:03 AM  Radiology: DG Chest 2 View Result Date: 07/14/2024 EXAM: 2 VIEW(S) XRAY OF THE CHEST 07/14/2024 06:31:00 AM COMPARISON: 07/04/2024 CLINICAL HISTORY: sob FINDINGS: LUNGS AND PLEURA: Hyperinflation. Diffuse interstitial thickening. Persistent right upper lobe and superior segment of left lower lobe opacities. Diffuse reticular and nodular opacities  are noted throughout the remaining portions of the lungs. Bilateral bronchial wall thickening and mild bronchiectasis are noted. No pleural effusion. No pneumothorax. HEART AND MEDIASTINUM: No acute abnormality of the cardiac and mediastinal silhouettes. BONES AND SOFT TISSUES: No acute osseous abnormality. IMPRESSION: 1. Diffuse interstitial thickening with diffuse reticular and nodular opacities , as well as diffuse bronchiectasis and bronchial wall thickening. Findings are favored to represent sequelae of chronic, indolent inflammation or atypical infection such as MAI. 2. Persistent right upper lobe and superior segment of left lower lobe opacities. Compatible with superimposed acute pneumonia. 3. Hyperinflation. Electronically signed by: Waddell Calk MD MD 07/14/2024 06:46 AM EST RP Workstation: [id number]    .Critical Care  Performed by: Beola Terrall RAMAN, PA-C Authorized by: Beola Terrall RAMAN, PA-C   Critical care provider statement:    Critical care time (minutes):  30   Critical care time was exclusive of:  Separately billable procedures and treating other patients   Critical care was necessary to treat or prevent imminent or life-threatening deterioration of the following conditions:  Sepsis   Critical care was time spent  personally by me on the following activities:  Development of treatment plan with patient or surrogate, discussions with consultants, evaluation of patient's response to treatment, examination of patient, ordering and review of laboratory studies, ordering and review of radiographic studies, ordering and performing treatments and interventions, pulse oximetry, re-evaluation of patient's condition, review of old charts and obtaining history from patient or surrogate   I assumed direction of critical care for this patient from another provider in my specialty: yes     Care discussed with: admitting provider      Medications Ordered in the ED  vancomycin  (VANCOREADY) IVPB 1250 mg/250 mL (1,250 mg Intravenous New Bag/Given 07/14/24 0947)  lactated ringers  bolus 1,000 mL (1,000 mLs Intravenous New Bag/Given 07/14/24 0811)  methylPREDNISolone  sodium succinate (SOLU-MEDROL ) 125 mg/2 mL injection 125 mg (125 mg Intravenous Given 07/14/24 0749)  ipratropium-albuterol  (DUONEB) 0.5-2.5 (3) MG/3ML nebulizer solution 3 mL (3 mLs Nebulization Given 07/14/24 0749)  ceFEPIme  (MAXIPIME ) 2 g in sodium chloride  0.9 % 100 mL IVPB (0 g Intravenous Stopped 07/14/24 0948)  ipratropium-albuterol  (DUONEB) 0.5-2.5 (3) MG/3ML nebulizer solution 3 mL (3 mLs Nebulization Given 07/14/24 0948)   Medical Decision Making Amount and/or Complexity of Data Reviewed Labs: ordered. Radiology: ordered.  Risk Prescription drug management. Decision regarding hospitalization.   This patient is a 54 year old male who presents to the ED for concern of shortness of breath, cough, hiccups that has been ongoing x 10 days, recently treated for pneumonia with Augmentin  x 14 days, having stopped taking his course without finishing x 3 days ago where he notes he has had worsening symptoms.  Followed by pulmonology but has not been able to schedule appointment with them yet.  Noting worsening shortness of breath, cough, hiccups.  On physical exam,  patient is in no acute distress, afebrile, alert and orient x 4, conversational dyspnea, tachypneic, tachycardic with heart rate of 110s.  Notably does have wheezing to left lower and midlung as well as rales to left lower lung.  No lower leg edema.  No murmur.  Provided DuoNebs and Solu-Medrol  with improvement of wheezing and shortness of breath.  Started on cefepime  and vancomycin  and fluid bolus concerns for sepsis despite no white count.  No lactic, no white count, but x-ray showing persistent pneumonia.  However with initial tachycardia, tachypnea and suspected infection, patient meets SIRS criteria.  With recent CT scan concerning for  possible MAI, started on broad-spectrum antibiotics with patient having failed outpatient therapy at this time.  Will seek to admit with patient's persistent tachycardia and tachypnea. Low suspicion for ACS, PE with suspecting unlikely sepsis due to pneumonia.   Patient care was then transferred over to Dr. Cleotilde with family practice.  Differential diagnoses prior to evaluation: The emergent differential diagnosis includes, but is not limited to, pneumonia, bronchiectasis, sepsis, URI, empyema, asthma exacerbation, lung abscess, malignancy, PE. This is not an exhaustive differential.   Past Medical History / Co-morbidities / Social History: Asthma, prediabetes, bronchiectasis.  Additional history: Chart reviewed. Pertinent results include:   Last seen in the emergency department on 07/10/2024 for community-acquired pneumonia.  Noting productive cough at that time.  Noted to have history of severe eosinophilic asthma.  Followed by pulmonary critical care.  Has had history of Klebsiella pneumonia in the past. With case discussed with PCCM and followed by pulmonology clinic, was started on Augmentin  x 14 days and was to follow-up with pulmonary clinic.  Recent CT on 07/04/2024 noted to have new patchy consolidation in the posterior right upper and superior left  lower lobes.  Lab Tests/Imaging studies: I personally interpreted labs/imaging and the pertinent results include: CBC shows a thrombocytosis of 452 otherwise unremarkable BMP notes normal BUN with a mild hyponatremia.  Hepatic function panel unremarkable PT/INR normal UA pending I-STAT lactic acid unremarkable Rester panel negative I-STAT VBG notes normal pH and CO2.  Chest x-ray shows Interstitial thickening reticular and nodular opacities and diffuse bronchiectasis and bronchial wall thickening as well as persistent right upper lobe and left lower lobe opacities compatible with pneumonia   I agree with the radiologist interpretation.  Cardiac monitoring: EKG obtained and interpreted by myself and attending physician which shows: Sinus tachycardia  EKG Interpretation Date/Time:  Monday July 14 2024 06:12:32 EST Ventricular Rate:  122 PR Interval:  124 QRS Duration:  86 QT Interval:  326 QTC Calculation: 464 R Axis:   82  Text Interpretation: Sinus tachycardia Right atrial enlargement Cannot rule out Anterior infarct , age undetermined Abnormal ECG No significant change since last tracing Confirmed by Ellouise Fine ([id number]) on 07/14/2024 7:27:03 AM          Medications: I ordered medication including cefepime , vancomycin , DuoNebs, Solu-Medrol , LR.  I have reviewed the patients home medicines and have made adjustments as needed.  Critical Interventions: None  Social Determinants of Health: Is followed by pulmonology clinic  Disposition: After consideration of the diagnostic results and the patients response to treatment, I feel that the patient would benefit from admission, patient care transferred to Dr. Cleotilde with family practice Final diagnoses:  Pneumonia of both lungs due to infectious organism, unspecified part of lung  COPD exacerbation (HCC)  Sepsis due to pneumonia [facility])    ED Discharge Orders     None          Beola Terrall RAMAN, PA-C 07/14/24  1007    Ellouise Fine K, DO 07/14/24 1256  "

## 2024-07-14 NOTE — Progress Notes (Signed)
 RT instructed patient on use of flutter valve. Patient was able to demonstrate back good technique and had strong congested cough following use.

## 2024-07-14 NOTE — Plan of Care (Signed)
 Discussed consult with Corean with pulmonology, they will see the patient

## 2024-07-14 NOTE — Progress Notes (Signed)
 ED Pharmacy Antibiotic Sign Off An antibiotic consult was received from an ED provider for vancomycin , cefepime  per pharmacy dosing for PNA. A chart review was completed to assess appropriateness.   The following one time order(s) were placed:  Vancomycin  1250mg  IV x1 and cefepime  2g IV x1  Further antibiotic and/or antibiotic pharmacy consults should be ordered by the admitting provider if indicated.   Thank you for allowing pharmacy to be a part of this patient's care.   Sharyne Glatter, PharmD, BCCCP Critical Care Clinical Pharmacist 07/14/2024 7:45 AM

## 2024-07-15 DIAGNOSIS — J4550 Severe persistent asthma, uncomplicated: Secondary | ICD-10-CM

## 2024-07-15 DIAGNOSIS — Z789 Other specified health status: Secondary | ICD-10-CM

## 2024-07-15 DIAGNOSIS — J8283 Eosinophilic asthma: Secondary | ICD-10-CM

## 2024-07-15 DIAGNOSIS — J471 Bronchiectasis with (acute) exacerbation: Secondary | ICD-10-CM

## 2024-07-15 DIAGNOSIS — L299 Pruritus, unspecified: Secondary | ICD-10-CM | POA: Insufficient documentation

## 2024-07-15 LAB — BASIC METABOLIC PANEL WITH GFR
Anion gap: 8 (ref 5–15)
BUN: 23 mg/dL — ABNORMAL HIGH (ref 6–20)
CO2: 28 mmol/L (ref 22–32)
Calcium: 8.6 mg/dL — ABNORMAL LOW (ref 8.9–10.3)
Chloride: 96 mmol/L — ABNORMAL LOW (ref 98–111)
Creatinine, Ser: 1.02 mg/dL (ref 0.61–1.24)
GFR, Estimated: >60 mL/min
Glucose, Bld: 98 mg/dL (ref 70–99)
Potassium: 4 mmol/L (ref 3.5–5.1)
Sodium: 132 mmol/L — ABNORMAL LOW (ref 135–145)

## 2024-07-15 LAB — GLUCOSE, CAPILLARY
Glucose-Capillary: 109 mg/dL — ABNORMAL HIGH (ref 70–99)
Glucose-Capillary: 143 mg/dL — ABNORMAL HIGH (ref 70–99)

## 2024-07-15 LAB — CBC
HCT: 38.3 % — ABNORMAL LOW (ref 39.0–52.0)
Hemoglobin: 12.9 g/dL — ABNORMAL LOW (ref 13.0–17.0)
MCH: 28.3 pg (ref 26.0–34.0)
MCHC: 33.7 g/dL (ref 30.0–36.0)
MCV: 84 fL (ref 80.0–100.0)
Platelets: 390 K/uL (ref 150–400)
RBC: 4.56 MIL/uL (ref 4.22–5.81)
RDW: 14.6 % (ref 11.5–15.5)
WBC: 8.2 K/uL (ref 4.0–10.5)
nRBC: 0 % (ref 0.0–0.2)

## 2024-07-15 LAB — HIV ANTIBODY (ROUTINE TESTING W REFLEX): HIV Screen 4th Generation wRfx: NONREACTIVE

## 2024-07-15 LAB — VITAMIN B12: Vitamin B-12: 1276 pg/mL — ABNORMAL HIGH (ref 180–914)

## 2024-07-15 MED ORDER — DIPHENHYDRAMINE HCL 25 MG PO CAPS
25.0000 mg | ORAL_CAPSULE | Freq: Once | ORAL | Status: AC
  Administered 2024-07-15: 25 mg via ORAL
  Filled 2024-07-15: qty 1

## 2024-07-15 MED ORDER — RIVAROXABAN 10 MG PO TABS
10.0000 mg | ORAL_TABLET | Freq: Every day | ORAL | Status: DC
  Administered 2024-07-16 – 2024-07-21 (×6): 10 mg via ORAL
  Filled 2024-07-15 (×6): qty 1

## 2024-07-15 MED ORDER — ENSURE PLUS HIGH PROTEIN PO LIQD
237.0000 mL | Freq: Two times a day (BID) | ORAL | Status: DC
  Administered 2024-07-15: 237 mL via ORAL

## 2024-07-15 MED ORDER — SENNOSIDES-DOCUSATE SODIUM 8.6-50 MG PO TABS
1.0000 | ORAL_TABLET | Freq: Every day | ORAL | Status: DC
  Administered 2024-07-15 – 2024-07-16 (×2): 1 via ORAL
  Filled 2024-07-15 (×2): qty 1

## 2024-07-15 MED ORDER — ADULT MULTIVITAMIN W/MINERALS CH
1.0000 | ORAL_TABLET | Freq: Every day | ORAL | Status: DC
  Administered 2024-07-15 – 2024-07-21 (×7): 1 via ORAL
  Filled 2024-07-15 (×7): qty 1

## 2024-07-15 MED ORDER — SODIUM CHLORIDE 3 % IN NEBU
4.0000 mL | INHALATION_SOLUTION | Freq: Every day | RESPIRATORY_TRACT | Status: AC
  Administered 2024-07-18: 4 mL via RESPIRATORY_TRACT
  Filled 2024-07-15 (×2): qty 4
  Filled 2024-07-15 (×2): qty 15

## 2024-07-15 MED ORDER — POLYETHYLENE GLYCOL 3350 17 G PO PACK
17.0000 g | PACK | Freq: Every day | ORAL | Status: DC
  Administered 2024-07-15 – 2024-07-21 (×6): 17 g via ORAL
  Filled 2024-07-15 (×7): qty 1

## 2024-07-15 MED ORDER — ENSURE PLUS HIGH PROTEIN PO LIQD
237.0000 mL | Freq: Three times a day (TID) | ORAL | Status: DC
  Administered 2024-07-15 – 2024-07-21 (×17): 237 mL via ORAL

## 2024-07-15 MED ORDER — MELATONIN 3 MG PO TABS
3.0000 mg | ORAL_TABLET | Freq: Once | ORAL | Status: AC
  Administered 2024-07-15: 3 mg via ORAL
  Filled 2024-07-15: qty 1

## 2024-07-15 NOTE — Assessment & Plan Note (Addendum)
 Last A1C on 1/12 was 6.4 in from 6.1 in 9/25 - Carb modified diet - CBG ACHS. May need SSI while on steroid if BG become elevated

## 2024-07-15 NOTE — Hospital Course (Addendum)
 [place] Fleming is a 54 y.o.male with a history of severe persistent asthma w/eosinophilic features and bronchiectasis (previously on IL-5 therapy but lost access to Dupixent  w/o insurance), and severe malnutrition who was admitted to the [facility] at [facility] for pneumonia w/ bronchiectasis and acute on chronic cough. His hospital course is detailed below:  Pneumonia  Eosinophilic asthma Patient w/severe persistent eosinophilic asthma, presents to the ED for acute on chronic cough with SOB. He has not been able to afford Dupixent  injection to help w/ asthma exacerbation. Pulmonology was consulted given his history.  He received cefepime .  There was some concern for allergic response/itching related to cefepime , and he was switched to Unasyn  and ultimately was discharged on Augmentin  with an end date of 07/27/24. His lower respiratory cultures grew Klebsiella and acid-fast culture/smear are pending upon discharge. Patient will need to follow-up with pulmonology closely outpatient to consider further workup including possible bronchoscopy to rule out actinomyces (prior culture was positive for GPC however never speciated). Patient was continued on inhalers and 1 mg prednisone .  Hypotension / Tachycardia During this admission patient was noted to be in sinus tachycardia in the 120s with symptoms. CTA PE and DVT US  were negative. Received 1 dose of IV 5 mg metoprolol  resulting in hypotensive episode. However there was also concern for possible anaphylactic reaction as patient had concern for many allergies including cefepime  and Unasyn  with associated rash, possible that this may have also been anaphylactic. Due to his hypotension he required a brief ICU stay during which he received vasopressors for about 1 day after which she was weaned off and transferred back to the floor.  Malnutrition Pt w/ recent weight loss and poor PO intake. Starting Ensure w/meals during this admission. Dietitian was  consulted.  Itching/Rash Pt w/generalize itching and papular rash without known cause during this admission. Eucerin BID and Hydrocortisone  cream 1% BID were started with relief. Patient was instructed to continue these creams as needed on discharge.   Other chronic conditions were medically managed with home medications and formulary alternatives as necessary  PCP Follow-up Recommendations: Ensure pulmonology follow-up Follow-up acid-fast culture/smear and repeat respiratory culture Ensure compliance with inhalers, prednisone  Patient benefited from baclofen  for hiccups F/u on rash progression Consider allergy  testing

## 2024-07-15 NOTE — Assessment & Plan Note (Addendum)
 Remain on 2L [place]. Resp status improved. Was on Augmentin  at home. Received 1 dose vancomycin  in ED Negative respiratory panel  - Pulmonology consulted, appreciate recommendations  - Continue Cefepime  2 g every 8 hours (07/14/24 - )  - Lower respiratory sputum w/ no growth to date and AFB culture  - Continue Prednisone  1 mg daily w/ breakfast (1/13 - )   - Continue ANORO ELLIPTA  1 puff daily   - Continue Singulair  10 mg Daily at bedtime  - Pending Blood cx  - Vitals per floor - AM BMP and CBC

## 2024-07-15 NOTE — Assessment & Plan Note (Signed)
 C/o generalize itching this AM - Benadryl  25 mg PO once - Pharmacy consult for medication cause - Will continue to monitor

## 2024-07-15 NOTE — Progress Notes (Signed)
 Initial Nutrition Assessment  DOCUMENTATION CODES:   Severe malnutrition in context of chronic illness, Underweight  INTERVENTION:   -Ensure Plus High Protein po TID, each supplement provides 350 kcal and 20 grams of protein.   -Multivitamin with minerals daily  -Liberalize diet to regular given severe malnutrition and to optimize menu options.  NUTRITION DIAGNOSIS:   Severe Malnutrition related to chronic illness (bronchiectasis) as evidenced by severe fat depletion, severe muscle depletion, energy intake < 75% for > 7 days, percent weight loss.  GOAL:   Patient will meet greater than or equal to 90% of their needs  MONITOR:   PO intake, Supplement acceptance  REASON FOR ASSESSMENT:   Consult Assessment of nutrition requirement/status  ASSESSMENT:   [name] presenting w/ PMH of severe persistent Asthma w/ eosinophilia features Bronchiectasis ( On IL-5 therapy but has been losing access to Dupixant) ,Severe malnutrition, and s/p appendectomy in 2016. He has been admitted for pneumonia w/ bronchiectasis and acute on chronic cough.  Patient in room, just finished lunch. Ate 50% of his chicken and broccoli and 100% of his mac and cheese. Drank apple juice as well. Reports feeling like options are limited on diet so will liberalize. Pt states his appetite is poor and this has been the case for 3 weeks now. States he was not eating at all at home for 3 weeks. Typically pt eats 3 meals consistently when appetite is good. Does not take vitamins or use protein supplementation at home. Does not consume pork for religious reasons.  Denies any issues with swallowing, chewing or taste. Pt is agreeable to Ensure supplements and will add daily MVI given poor PO recently.   Patient reports weighing ~120-123 lbs 2 weeks ago.  Weighed patient in bed: 120 lbs (with blankets, pillows, etc). Pt has had 29 lbs of weight loss since 11/06/23 (19% wt loss x 8 months, significant for time  frame).  Medications: Miralax , Prednisone , Senokot  Labs reviewed: CBGs: 109-143 Low sodium   NUTRITION - FOCUSED PHYSICAL EXAM:  Flowsheet Row Most Recent Value  Orbital Region Severe depletion  Upper Arm Region Severe depletion  Thoracic and Lumbar Region Severe depletion  Buccal Region Severe depletion  Temple Region Severe depletion  Clavicle Bone Region Severe depletion  Clavicle and Acromion Bone Region Severe depletion  Scapular Bone Region Severe depletion  Dorsal Hand Moderate depletion  Patellar Region Severe depletion  Anterior Thigh Region Severe depletion  Posterior Calf Region Severe depletion  Edema (RD Assessment) None  Hair Reviewed  Eyes Reviewed  Mouth Reviewed  Skin Reviewed  [dry]  Nails Reviewed    Diet Order:   Diet Order             Diet regular Room service appropriate? Yes; Fluid consistency: Thin  Diet effective now                   EDUCATION NEEDS:   Education needs have been addressed  Skin:  Skin Assessment: Reviewed RN Assessment  Last BM:  PTA  Height:   Ht Readings from Last 1 Encounters:  07/14/24 5' 11 (1.803 m)    Weight:   Wt Readings from Last 1 Encounters:  07/14/24 55.8 kg    BMI:  Body mass index is 17.16 kg/m.  Estimated Nutritional Needs:   Kcal:  2300-2500  Protein:  115-130g  Fluid:  2L/day  Morna Lee, MS, RD, LDN Inpatient Clinical Dietitian Contact via Secure chat

## 2024-07-15 NOTE — Assessment & Plan Note (Addendum)
 Significant weight lose w/ poor PO intake in the last 6 months - Starting Ensure - Dietitian consult

## 2024-07-15 NOTE — Progress Notes (Addendum)
 "    Daily Progress Note Intern Pager: [phone]  Patient name: Shane Fleming Medical record number: [id number] Date of birth: 01/24/1971 Age: 54 y.o. Gender: male  Primary Care Provider: [facility], [place] Consultants: Pulmonology Code Status: Full Code  Pt Overview and Major Events to Date:  07/14/24 : Admitted  Shane Fleming is a [name] presenting w/ PMH of severe persistent Asthma w/ eosinophilia features Bronchiectasis ( On IL-5 therapy but has been losing access to Dupixant) ,Severe malnutrition, and s/p appendectomy in 2016. He has been admitted for pneumonia w/ bronchiectasis and acute on chronic cough.    Assessment & Plan Pneumonia Eosinophilic asthma Bronchiectasis (HCC) Chronic cough Remain on 2L [place]. Resp status improved. Was on Augmentin  at home. Received 1 dose vancomycin  in ED Negative respiratory panel  - Pulmonology consulted, appreciate recommendations  - Continue Cefepime  2 g every 8 hours (07/14/24 - )  - Lower respiratory sputum w/ no growth to date and AFB culture  - Continue Prednisone  1 mg daily w/ breakfast (1/13 - )   - Continue ANORO ELLIPTA  1 puff daily   - Continue Singulair  10 mg Daily at bedtime  - Pending Blood cx  - Vitals per floor - AM BMP and CBC Prediabetes Last A1C on 1/12 was 6.4 in from 6.1 in 9/25 - Carb modified diet - CBG ACHS. May need SSI while on steroid if BG become elevated Protein-calorie malnutrition, severe Significant weight lose w/ poor PO intake in the last 6 months - Starting Ensure - Dietitian consult  Itching C/o generalize itching this AM - Benadryl  25 mg PO once - Pharmacy consult for medication cause - Will continue to monitor   FEN/GI: Carb modified  PPx: Lovenox   Dispo:Pending clinical improvement    Subjective:  Respiratory improved per pt. No cough during round. State has been losing a lot of weight w/ poor appetite and PO intake in the last 2 months. Last BM 07/14/24   Objective: Temp:   [97.4 F (36.3 C)-98.8 F (37.1 C)] 97.4 F (36.3 C) (01/13 0741) Pulse Rate:  [98-119] 100 (01/13 0741) Resp:  [18-32] 18 (01/13 0741) BP: (98-119)/(66-84) 119/72 (01/13 0741) SpO2:  [88 %-99 %] 94 % (01/13 0741) FiO2 (%):  [28 %] 28 % (01/13 0347)  Physical Exam Constitutional:      Appearance: He is well-developed.  Cardiovascular:     Rate and Rhythm: Normal rate.  Pulmonary:     Effort: Pulmonary effort is normal.     Breath sounds: Examination of the right-upper field reveals wheezing. Examination of the left-upper field reveals wheezing. Examination of the right-lower field reveals decreased breath sounds. Examination of the left-lower field reveals decreased breath sounds. Decreased breath sounds and wheezing present.     Comments: Mild wheezing on 2L [place] Abdominal:     Palpations: Abdomen is soft.     Comments: Last BM 07/14/24  Neurological:     Mental Status: He is alert and oriented to person, place, and time.  Psychiatric:        Mood and Affect: Mood normal.        Behavior: Behavior normal.      Laboratory: Most recent CBC Lab Results  Component Value Date   WBC 8.2 07/15/2024   HGB 12.9 (L) 07/15/2024   HCT 38.3 (L) 07/15/2024   MCV 84.0 07/15/2024   PLT 390 07/15/2024   Most recent BMP    Latest Ref Rng & Units 07/15/2024    5:33  AM  BMP  Glucose 70 - 99 mg/dL 98   BUN 6 - 20 mg/dL 23   Creatinine 9.38 - 1.24 mg/dL 8.97   Sodium 864 - 854 mmol/L 132   Potassium 3.5 - 5.1 mmol/L 4.0   Chloride 98 - 111 mmol/L 96   CO2 22 - 32 mmol/L 28   Calcium  8.9 - 10.3 mg/dL 8.6     Imaging/Diagnostic Tests: EXAM: CT CHEST WITH CONTRAST 07/04/2024 06:26:11 AM  IMPRESSION: 1. New patchy consolidation in the posterior right upper lobe and superior segment of the left lower lobe, most consistent with pneumonia; given associated tree-in-bud nodularity and airway mucus/debris, consider aspiration and atypical infection (including nontuberculous  mycobacteria) in the differential. 2. Recommend follow-up chest radiograph in 6-8 weeks to document resolution. 3. Moderate bilateral cylindrical bronchiectasis, lower lobe predominant, with endobronchial filling defects compatible with mucous plugging or aspiration. 4. Extensive tree-in-bud nodularity throughout the lungs. 5. Prominent mediastinal and hilar lymph nodes, increased in size, favored reactive.  Shane Houston NOVAK, DO 07/15/2024, 8:49 AM  PGY-1, [facility] FPTS Intern pager: [phone], text pages welcome Secure chat group [facility] Teaching Service   "

## 2024-07-15 NOTE — Progress Notes (Signed)
 "  NAME:  Shane Fleming, MRN:  [id number], DOB:  02/15/1971, LOS: 1 ADMISSION DATE:  07/14/2024, CONSULTATION DATE:  07/15/2024 REFERRING MD:  Family Medicine, CHIEF COMPLAINT:  cough   History of Present Illness:  54 year old woman history of asthma with eosinophilia lost access to Dupixent  with subsequently worsening disease and symptoms, bronchiectasis with acute on chronic cough.  Seen in clinic 05/2024.  Prednisone  taper.  Worsening cough for last 2 weeks.  Presented to ED 07/05/2023.  CT chest showed chronic bronchiectasis but new posterior right sided infiltrates concerning for pneumonia.  Placed on Augmentin  based on prior culture data.  He tells me he septally improved.  Cough is better than it was 1 2 weeks ago.  But still persistent cough.  This prompted presentation to the ED.  Chest x-ray looks unchanged to my eye, chronic infiltrates concerning for NTM disease and bronchiectatic changes.  Pertinent  Medical History  Severe persistent asthma with eosinophilic features, bronchiectasis  Significant Hospital Events: Including procedures, antibiotic start and stop dates in addition to other pertinent events     Interim History / Subjective:  Chart reviewed. LRCx in progress. Placed on O2 overnight.   Objective    Blood pressure 131/66, pulse (!) 102, temperature 98.5 F (36.9 C), resp. rate 18, height 5' 11 (1.803 m), weight 55.8 kg, SpO2 95%.    FiO2 (%):  [28 %] 28 %   Intake/Output Summary (Last 24 hours) at 07/15/2024 1612 Last data filed at 07/15/2024 1555 Gross per 24 hour  Intake 378.01 ml  Output --  Net 378.01 ml   Filed Weights   07/14/24 0603  Weight: 55.8 kg    Examination: Deferred  Resolved problem list   Assessment and Plan   Acute on chronic cough: Related to underlying poorly controlled asthma as well as bronchiectasis.  Recently improved with course of Augmentin .   -- Continue airway clearance ordered  Bronchiectasis with exacerbation: Recent CT  scan 1/2 with new infiltrates.  Sounds like antibiotic improved with Augmentin . -- Continue cefepime   -- lower respiratory culture and AFB culture obtained, gram stain with GNR and GPC  Severe persistent asthma with eosinophilia: Lost access to IL-5 therapy, biologic with worsening symptoms. -- Continue low-dose prednisone  for now, continue Anoro, continue Singulair   Acute Hypoxemia:  Overnight so query OSA. -- O2 goal 90%, wean as tolerated  PCCM will follow  Labs   CBC: Recent Labs  Lab 07/14/24 0613 07/14/24 0736 07/15/24 0533  WBC 9.5  --  8.2  HGB 14.3 15.3 12.9*  HCT 43.8 45.0 38.3*  MCV 86.1  --  84.0  PLT 452*  --  390    Basic Metabolic Panel: Recent Labs  Lab 07/14/24 0613 07/14/24 0736 07/15/24 0533  NA 133* 135 132*  K 4.3 3.9 4.0  CL 95*  --  96*  CO2 30  --  28  GLUCOSE 105*  --  98  BUN 14  --  23*  CREATININE 0.97  --  1.02  CALCIUM  9.0  --  8.6*   GFR: Estimated Creatinine Clearance: 63.8 mL/min (by C-G formula based on SCr of 1.02 mg/dL). Recent Labs  Lab 07/14/24 0613 07/14/24 0736 07/15/24 0533  WBC 9.5  --  8.2  LATICACIDVEN  --  1.7  --     Liver Function Tests: Recent Labs  Lab 07/14/24 0701  AST 34  ALT 42  ALKPHOS 75  BILITOT 0.4  PROT 7.6  ALBUMIN 3.2*   No results for  input(s): LIPASE, AMYLASE in the last 168 hours. No results for input(s): AMMONIA in the last 168 hours.  ABG    Component Value Date/Time   PHART 7.387 02/11/2021 1146   PCO2ART 52.7 (H) 02/11/2021 1146   PO2ART 45 (L) 02/11/2021 1146   HCO3 30.3 (H) 07/14/2024 0736   TCO2 32 07/14/2024 0736   O2SAT 80 07/14/2024 0736     Coagulation Profile: Recent Labs  Lab 07/14/24 0701  INR 1.0    Cardiac Enzymes: No results for input(s): CKTOTAL, CKMB, CKMBINDEX, TROPONINI in the last 168 hours.  HbA1C: HbA1c, POC (prediabetic range)  Date/Time Value Ref Range Status  08/18/2022 10:04 AM 6.2 5.7 - 6.4 % Final  02/15/2022 11:36 AM 6.0  5.7 - 6.4 % Final   HbA1c, POC (controlled diabetic range)  Date/Time Value Ref Range Status  09/19/2023 09:34 AM 6.3 0.0 - 7.0 % Final   Hgb A1c MFr Bld  Date/Time Value Ref Range Status  07/14/2024 06:13 AM 6.4 (H) 4.8 - 5.6 % Final    Comment:    (NOTE) Diagnosis of Diabetes The following HbA1c ranges recommended by the American Diabetes Association (ADA) may be used as an aid in the diagnosis of diabetes mellitus.  Hemoglobin             Suggested A1C NGSP%              Diagnosis  <5.7                   Non Diabetic  5.7-6.4                Pre-Diabetic  >6.4                   Diabetic  <7.0                   Glycemic control for                       adults with diabetes.    03/26/2024 11:53 AM 6.1 (H) 4.8 - 5.6 % Final    Comment:             Prediabetes: 5.7 - 6.4          Diabetes: >6.4          Glycemic control for adults with diabetes: <7.0     CBG: Recent Labs  Lab 07/14/24 1740 07/15/24 0742 07/15/24 1143  GLUCAP 168* 109* 143*    Review of Systems:   Comprehensive review of system is negative unless mentioned in HPI above  Past Medical History:  He,  has a past medical history of Asthma, Prediabetes, and Sinusitis (2018).   Surgical History:   Past Surgical History:  Procedure Laterality Date   APPENDECTOMY     BRONCHIAL WASHINGS  02/17/2021   Procedure: BRONCHIAL WASHINGS;  Surgeon: Kara Dorn NOVAK, MD;  Location: [facility] ENDOSCOPY;  Service: Pulmonary;;   LAPAROSCOPIC APPENDECTOMY N/A 04/08/2015   Procedure: APPENDECTOMY LAPAROSCOPIC;  Surgeon: Morene Olives, MD;  Location: [facility] ORS;  Service: General;  Laterality: N/A;   VIDEO BRONCHOSCOPY N/A 02/17/2021   Procedure: VIDEO BRONCHOSCOPY WITHOUT FLUORO;  Surgeon: Kara Dorn NOVAK, MD;  Location: [facility] ENDOSCOPY;  Service: Pulmonary;  Laterality: N/A;     Social History:   reports that he has never smoked. He has never been exposed to tobacco smoke. He has never used smokeless tobacco. He  reports that he does not  drink alcohol and does not use drugs.   Family History:  His family history includes Asthma in his father.   Allergies Allergies[1]   Home Medications  Prior to Admission medications  Medication Sig Start Date End Date Taking? Authorizing Provider  albuterol  (PROVENTIL ) (2.5 MG/3ML) 0.083% nebulizer solution Inhale 3 mLs into the lungs every 4 (four) hours as needed for wheezing or shortness of breath. 03/26/24  Yes Theotis Haze ORN, NP  albuterol  (VENTOLIN  HFA) 108 (90 Base) MCG/ACT inhaler Inhale 2 puffs into the lungs every 6 (six) hours as needed for wheezing or shortness of breath (when travelling). 09/07/23  Yes Kara Dorn NOVAK, MD  amoxicillin -clavulanate (AUGMENTIN ) 875-125 MG tablet Take 1 tablet by mouth every 12 (twelve) hours. 07/04/24  Yes Randol Simmonds, MD  budesonide  (PULMICORT ) 0.5 MG/2ML nebulizer solution Take 2 mLs (0.5 mg total) by nebulization 2 (two) times daily. Patient not taking: Reported on 07/14/2024 01/17/24   Kara Dorn NOVAK, MD  montelukast  (SINGULAIR ) 10 MG tablet Take 1 tablet (10 mg total) by mouth at bedtime. Patient not taking: Reported on 07/14/2024 03/26/24   Fleming, Zelda W, NP  predniSONE  (DELTASONE ) 1 MG tablet Take 1 tablet (1 mg total) by mouth daily with breakfast. Patient not taking: Reported on 07/14/2024 05/21/24   Kara Dorn NOVAK, MD  umeclidinium-vilanterol (ANORO ELLIPTA ) 62.5-25 MCG/ACT AEPB Inhale 1 puff into the lungs daily. Patient not taking: Reported on 07/14/2024 01/17/24   Kara Dorn NOVAK, MD     Critical care time:     Donnice JONELLE Beals, MD See TRACEY           [1]  Allergies Allergen Reactions   Porcine (Pork) Protein-Containing Drug Products     Religious   "

## 2024-07-16 ENCOUNTER — Inpatient Hospital Stay (HOSPITAL_COMMUNITY): Payer: Self-pay

## 2024-07-16 ENCOUNTER — Other Ambulatory Visit (HOSPITAL_COMMUNITY): Payer: Self-pay

## 2024-07-16 DIAGNOSIS — M79661 Pain in right lower leg: Secondary | ICD-10-CM

## 2024-07-16 DIAGNOSIS — R000 Tachycardia, unspecified: Secondary | ICD-10-CM

## 2024-07-16 LAB — PHOSPHORUS: Phosphorus: 2 mg/dL — ABNORMAL LOW (ref 2.5–4.6)

## 2024-07-16 LAB — CBC
HCT: 40 % (ref 39.0–52.0)
Hemoglobin: 13.6 g/dL (ref 13.0–17.0)
MCH: 28.6 pg (ref 26.0–34.0)
MCHC: 34 g/dL (ref 30.0–36.0)
MCV: 84.2 fL (ref 80.0–100.0)
Platelets: 389 K/uL (ref 150–400)
RBC: 4.75 MIL/uL (ref 4.22–5.81)
RDW: 14.6 % (ref 11.5–15.5)
WBC: 8.8 K/uL (ref 4.0–10.5)
nRBC: 0 % (ref 0.0–0.2)

## 2024-07-16 LAB — ACID FAST SMEAR (AFB, MYCOBACTERIA): Acid Fast Smear: NEGATIVE

## 2024-07-16 LAB — GLUCOSE, CAPILLARY
Glucose-Capillary: 107 mg/dL — ABNORMAL HIGH (ref 70–99)
Glucose-Capillary: 133 mg/dL — ABNORMAL HIGH (ref 70–99)
Glucose-Capillary: 97 mg/dL (ref 70–99)

## 2024-07-16 LAB — CULTURE, RESPIRATORY W GRAM STAIN

## 2024-07-16 LAB — BASIC METABOLIC PANEL WITH GFR
Anion gap: 7 (ref 5–15)
BUN: 21 mg/dL — ABNORMAL HIGH (ref 6–20)
CO2: 28 mmol/L (ref 22–32)
Calcium: 8.4 mg/dL — ABNORMAL LOW (ref 8.9–10.3)
Chloride: 95 mmol/L — ABNORMAL LOW (ref 98–111)
Creatinine, Ser: 0.92 mg/dL (ref 0.61–1.24)
GFR, Estimated: >60 mL/min
Glucose, Bld: 124 mg/dL — ABNORMAL HIGH (ref 70–99)
Potassium: 4.7 mmol/L (ref 3.5–5.1)
Sodium: 129 mmol/L — ABNORMAL LOW (ref 135–145)

## 2024-07-16 LAB — VITAMIN B1: Vitamin B1 (Thiamine): 96 nmol/L (ref 66.5–200.0)

## 2024-07-16 LAB — MAGNESIUM: Magnesium: 2 mg/dL (ref 1.7–2.4)

## 2024-07-16 MED ORDER — LACTATED RINGERS IV SOLN: INTRAVENOUS | Status: DC

## 2024-07-16 MED ORDER — IOHEXOL 350 MG/ML SOLN
75.0000 mL | Freq: Once | INTRAVENOUS | Status: AC | PRN
  Administered 2024-07-16: 75 mL via INTRAVENOUS

## 2024-07-16 MED ORDER — LACTATED RINGERS IV BOLUS
1000.0000 mL | Freq: Once | INTRAVENOUS | Status: AC
  Administered 2024-07-16: 1000 mL via INTRAVENOUS

## 2024-07-16 MED ORDER — SODIUM CHLORIDE 0.9 % IV SOLN
25.0000 mg | Freq: Three times a day (TID) | INTRAVENOUS | Status: DC | PRN
  Administered 2024-07-17: 25 mg via INTRAVENOUS
  Filled 2024-07-16 (×2): qty 1

## 2024-07-16 MED ORDER — BACLOFEN 10 MG PO TABS
10.0000 mg | ORAL_TABLET | Freq: Once | ORAL | Status: AC
  Administered 2024-07-16: 10 mg via ORAL
  Filled 2024-07-16: qty 1

## 2024-07-16 MED ORDER — SODIUM CHLORIDE 0.9 % IV SOLN
3.0000 g | Freq: Four times a day (QID) | INTRAVENOUS | Status: DC
  Administered 2024-07-16 – 2024-07-18 (×7): 3 g via INTRAVENOUS
  Filled 2024-07-16 (×7): qty 8

## 2024-07-16 MED ORDER — SODIUM PHOSPHATES 45 MMOLE/15ML IV SOLN
30.0000 mmol | Freq: Once | INTRAVENOUS | Status: AC
  Administered 2024-07-16: 30 mmol via INTRAVENOUS
  Filled 2024-07-16: qty 10

## 2024-07-16 MED ORDER — HYDROCORTISONE 1 % EX CREA
TOPICAL_CREAM | Freq: Four times a day (QID) | CUTANEOUS | Status: DC | PRN
  Filled 2024-07-16: qty 28

## 2024-07-16 MED ORDER — MELATONIN 3 MG PO TABS
3.0000 mg | ORAL_TABLET | Freq: Every day | ORAL | Status: DC
  Administered 2024-07-16 – 2024-07-20 (×5): 3 mg via ORAL
  Filled 2024-07-16 (×5): qty 1

## 2024-07-16 MED ORDER — DIPHENHYDRAMINE HCL 50 MG/ML IJ SOLN
50.0000 mg | Freq: Once | INTRAMUSCULAR | Status: AC
  Administered 2024-07-16: 50 mg via INTRAVENOUS
  Filled 2024-07-16: qty 1

## 2024-07-16 MED ORDER — HYDROXYZINE HCL 10 MG PO TABS
10.0000 mg | ORAL_TABLET | Freq: Three times a day (TID) | ORAL | Status: DC | PRN
  Administered 2024-07-16: 10 mg via ORAL
  Filled 2024-07-16: qty 1

## 2024-07-16 MED ORDER — PHENYLEPHRINE HCL-NACL 20-0.9 MG/250ML-% IV SOLN
25.0000 ug/min | INTRAVENOUS | Status: DC
  Administered 2024-07-17: 155 ug/min via INTRAVENOUS
  Administered 2024-07-17: 140 ug/min via INTRAVENOUS
  Administered 2024-07-17: 85 ug/min via INTRAVENOUS
  Administered 2024-07-17: 125 ug/min via INTRAVENOUS
  Administered 2024-07-17: 155 ug/min via INTRAVENOUS
  Administered 2024-07-17 – 2024-07-18 (×2): 25 ug/min via INTRAVENOUS
  Filled 2024-07-16 (×8): qty 250

## 2024-07-16 MED ORDER — METOPROLOL TARTRATE 5 MG/5ML IV SOLN
5.0000 mg | Freq: Once | INTRAVENOUS | Status: AC
  Administered 2024-07-16: 5 mg via INTRAVENOUS
  Filled 2024-07-16: qty 5

## 2024-07-16 MED ORDER — SODIUM CHLORIDE 0.9 % IV SOLN: 250.0000 mL | INTRAVENOUS | Status: AC

## 2024-07-16 MED ORDER — METHYLPREDNISOLONE SODIUM SUCC 40 MG IJ SOLR
40.0000 mg | Freq: Once | INTRAMUSCULAR | Status: AC
  Administered 2024-07-17: 40 mg via INTRAVENOUS
  Filled 2024-07-16: qty 1

## 2024-07-16 NOTE — Plan of Care (Signed)
  Problem: Education: Goal: Knowledge of General Education information will improve Description: Including pain rating scale, medication(s)/side effects and non-pharmacologic comfort measures Outcome: Progressing   Problem: Clinical Measurements: Goal: Ability to maintain clinical measurements within normal limits will improve Outcome: Progressing   Problem: Activity: Goal: Risk for activity intolerance will decrease Outcome: Progressing   Problem: Nutrition: Goal: Adequate nutrition will be maintained Outcome: Progressing   Problem: Coping: Goal: Level of anxiety will decrease Outcome: Progressing   Problem: Elimination: Goal: Will not experience complications related to bowel motility Outcome: Progressing   Problem: Safety: Goal: Ability to remain free from injury will improve Outcome: Progressing   Problem: Skin Integrity: Goal: Risk for impaired skin integrity will decrease Outcome: Progressing   

## 2024-07-16 NOTE — Assessment & Plan Note (Addendum)
 Phos 2 yesterday - Sodium Phosphate  30 mmol IV given last night - AM Phos

## 2024-07-16 NOTE — Plan of Care (Signed)

## 2024-07-16 NOTE — Assessment & Plan Note (Signed)
 Last A1C on 1/12 was 6.4 in from 6.1 in 9/25 BG in 100s  - Carb modified diet - CBG ACHS. May need SSI while on steroid if BG become elevated

## 2024-07-16 NOTE — Plan of Care (Addendum)
 Received page from RN that patient was having generalized itching his skin had broken out into a rash.  Recommended giving 50 mg Benadryl .  Evaluated at bedside.  His skin was generally red with excoriations from scratching.  Only new medications today include Xarelto , Unasyn , and Baclofen .  He denies shortness of breath, throat swelling, nausea, vomiting.  This could be contributing to his tachycardia.

## 2024-07-16 NOTE — Plan of Care (Addendum)
 FMTS Brief Progress Note  S: Evaluated bedside.  He is not feeling as hot as he was earlier.  No chest pain or shortness of breath at this time.  He has not eaten or drink much today because of loss of appetite and because of his hiccups.  States he has been dealing with hiccups for 4 years.   O: BP 96/67   Pulse (!) 129   Temp 98.8 F (37.1 C)   Resp (!) 42   Ht 5' 11 (1.803 m)   Wt 55.8 kg   SpO2 91%   BMI 17.16 kg/m   General: well appearing, NAD Cardiovascular: Sinus tachycardia, no m/r/g Respiratory: normal work of breathing on RA, CTAB Extremities: No swelling BLE  A/P: Tachycardia Pending EKG and metoprolol  5 mg IV Will give 1 L LR bolus, this could be secondary to dehydration  - Orders reviewed. Labs for AM ordered, which was adjusted as needed.   Shane Amster, DO 07/16/2024, 7:39 PM PGY-2,  Family Medicine Night Resident  Please page [phone] with questions.

## 2024-07-16 NOTE — Assessment & Plan Note (Addendum)
 Pt feel worse w/ inc HR in last 24 Hr while Sat 96% on RA this AM Neg Resp panel , CTPE neg  - Pulmonology consulted, appreciate recommendations  - Stop Cefepime  2 g every 8 hours (07/14/24 - 07/17/23 ) d/t itching  - Starting UNASYN  ( 1/12 - 1/25) for total of 14 days ABX  - Lower respiratory sputum w/ + Klebsiella P.  - AFB culture pending collection   - Continue Prednisone  1 mg daily w/ breakfast (1/13 - 1/16)   - Continue ANORO ELLIPTA  1 puff daily   - Continue Singulair  10 mg Daily at bedtime  - Blood cx no growth  - Vitals per floor - AM BMP , TSH

## 2024-07-16 NOTE — Assessment & Plan Note (Signed)
 Significant weight lose w/ poor PO intake in the last 6 months - Continue Ensure w/ meal - Dietitian consult and follow - Continue Regular diet

## 2024-07-16 NOTE — Progress Notes (Signed)
 Bilateral lower extremity venous duplex has been completed.  Results can be found in chart review under CV Proc.  07/16/2024 1:43 PM  Eldora Napp Elden Appl, RVT.

## 2024-07-16 NOTE — Progress Notes (Addendum)
 Cross covering ICU physician  Pt transferring to ICU for hypotension and tachycardia. Pt was initially tachycardic, given metoprolol  and remained tachycardic (EKG with sinus tach) but now hypotensive. Given 2L [place] without improvement.   -transferring to ICU for peripheral pressor -pt has been on lovenox  making thrombus less likely as well as ctpa and venous duplex today negative -ordered echo

## 2024-07-16 NOTE — Assessment & Plan Note (Signed)
 C/o generalize itching this AM. Per RN his itching seems to r/t IV ABX - Pharmacy consult for medication cause - Atarax  TID PRN started OVN - Will continue to monitor

## 2024-07-16 NOTE — Progress Notes (Addendum)
 "  NAME:  Shane Fleming, MRN:  [id number], DOB:  28-Feb-1971, LOS: 2 ADMISSION DATE:  07/14/2024, CONSULTATION DATE:  07/16/2024 REFERRING MD:  Family Medicine, CHIEF COMPLAINT:  cough   History of Present Illness:  54 year old woman history of asthma with eosinophilia lost access to Dupixent  with subsequently worsening disease and symptoms, bronchiectasis with acute on chronic cough.  Seen in clinic 05/2024.  Prednisone  taper.  Worsening cough for last 2 weeks.  Presented to ED 07/05/2023.  CT chest showed chronic bronchiectasis but new posterior right sided infiltrates concerning for pneumonia.  Placed on Augmentin  based on prior culture data.  He tells me he septally improved.  Cough is better than it was 1 2 weeks ago.  But still persistent cough.  This prompted presentation to the ED.  Chest x-ray looks unchanged to my eye, chronic infiltrates concerning for NTM disease and bronchiectatic changes.  Pertinent  Medical History  Severe persistent asthma with eosinophilic features, bronchiectasis  Significant Hospital Events: Including procedures, antibiotic start and stop dates in addition to other pertinent events     Interim History / Subjective:  Chart reviewed. LRCx with kleb pna again and rare Psa.  Objective    Blood pressure 117/77, pulse (!) 135, temperature 98.3 F (36.8 C), resp. rate 18, height 5' 11 (1.803 m), weight 55.8 kg, SpO2 97%.       No intake or output data in the 24 hours ending 07/16/24 1713  Filed Weights   07/14/24 0603  Weight: 55.8 kg    Examination: Deferred  Resolved problem list   Assessment and Plan   Acute on chronic cough: Related to underlying poorly controlled asthma as well as bronchiectasis.  Recently improved with course of Augmentin .   -- Continue airway clearance ordered  Bronchiectasis with exacerbation: Recent CT scan 1/2 with new infiltrates.  Sounds like antibiotic improved with Augmentin . Klebsiella  on culture.  -- Continue  cefepime  while admitted -- On discharge recommend Cefdinir to complete 14 days of abx  Severe persistent asthma with eosinophilia: Lost access to IL-5 therapy, biologic with worsening symptoms. -- Continue low-dose prednisone  for now, continue Anoro, continue Singulair   Acute Hypoxemia:  Overnight so query OSA. -- O2 goal 88%, wean as tolerated, if does not require O2 then can d/c without O2  PCCM will sign off  Labs   CBC: Recent Labs  Lab 07/14/24 0613 07/14/24 0736 07/15/24 0533 07/16/24 0248  WBC 9.5  --  8.2 8.8  HGB 14.3 15.3 12.9* 13.6  HCT 43.8 45.0 38.3* 40.0  MCV 86.1  --  84.0 84.2  PLT 452*  --  390 389    Basic Metabolic Panel: Recent Labs  Lab 07/14/24 0613 07/14/24 0736 07/15/24 0533 07/16/24 0248  NA 133* 135 132* 129*  K 4.3 3.9 4.0 4.7  CL 95*  --  96* 95*  CO2 30  --  28 28  GLUCOSE 105*  --  98 124*  BUN 14  --  23* 21*  CREATININE 0.97  --  1.02 0.92  CALCIUM  9.0  --  8.6* 8.4*  MG  --   --   --  2.0  PHOS  --   --   --  2.0*   GFR: Estimated Creatinine Clearance: 70.8 mL/min (by C-G formula based on SCr of 0.92 mg/dL). Recent Labs  Lab 07/14/24 0613 07/14/24 0736 07/15/24 0533 07/16/24 0248  WBC 9.5  --  8.2 8.8  LATICACIDVEN  --  1.7  --   --  Liver Function Tests: Recent Labs  Lab 07/14/24 0701  AST 34  ALT 42  ALKPHOS 75  BILITOT 0.4  PROT 7.6  ALBUMIN 3.2*   No results for input(s): LIPASE, AMYLASE in the last 168 hours. No results for input(s): AMMONIA in the last 168 hours.  ABG    Component Value Date/Time   PHART 7.387 02/11/2021 1146   PCO2ART 52.7 (H) 02/11/2021 1146   PO2ART 45 (L) 02/11/2021 1146   HCO3 30.3 (H) 07/14/2024 0736   TCO2 32 07/14/2024 0736   O2SAT 80 07/14/2024 0736     Coagulation Profile: Recent Labs  Lab 07/14/24 0701  INR 1.0    Cardiac Enzymes: No results for input(s): CKTOTAL, CKMB, CKMBINDEX, TROPONINI in the last 168 hours.  HbA1C: HbA1c, POC  (prediabetic range)  Date/Time Value Ref Range Status  08/18/2022 10:04 AM 6.2 5.7 - 6.4 % Final  02/15/2022 11:36 AM 6.0 5.7 - 6.4 % Final   HbA1c, POC (controlled diabetic range)  Date/Time Value Ref Range Status  09/19/2023 09:34 AM 6.3 0.0 - 7.0 % Final   Hgb A1c MFr Bld  Date/Time Value Ref Range Status  07/14/2024 06:13 AM 6.4 (H) 4.8 - 5.6 % Final    Comment:    (NOTE) Diagnosis of Diabetes The following HbA1c ranges recommended by the American Diabetes Association (ADA) may be used as an aid in the diagnosis of diabetes mellitus.  Hemoglobin             Suggested A1C NGSP%              Diagnosis  <5.7                   Non Diabetic  5.7-6.4                Pre-Diabetic  >6.4                   Diabetic  <7.0                   Glycemic control for                       adults with diabetes.    03/26/2024 11:53 AM 6.1 (H) 4.8 - 5.6 % Final    Comment:             Prediabetes: 5.7 - 6.4          Diabetes: >6.4          Glycemic control for adults with diabetes: <7.0     CBG: Recent Labs  Lab 07/15/24 0742 07/15/24 1143 07/16/24 0753 07/16/24 1125 07/16/24 1646  GLUCAP 109* 143* 107* 133* 97    Review of Systems:   N/a  Past Medical History:  He,  has a past medical history of Asthma, Prediabetes, and Sinusitis (2018).   Surgical History:   Past Surgical History:  Procedure Laterality Date   APPENDECTOMY     BRONCHIAL WASHINGS  02/17/2021   Procedure: BRONCHIAL WASHINGS;  Surgeon: Kara Dorn NOVAK, MD;  Location: [facility] ENDOSCOPY;  Service: Pulmonary;;   LAPAROSCOPIC APPENDECTOMY N/A 04/08/2015   Procedure: APPENDECTOMY LAPAROSCOPIC;  Surgeon: Morene Olives, MD;  Location: [facility] ORS;  Service: General;  Laterality: N/A;   VIDEO BRONCHOSCOPY N/A 02/17/2021   Procedure: VIDEO BRONCHOSCOPY WITHOUT FLUORO;  Surgeon: Kara Dorn NOVAK, MD;  Location: [facility] ENDOSCOPY;  Service: Pulmonary;  Laterality: N/A;     Social History:  reports that he has never  smoked. He has never been exposed to tobacco smoke. He has never used smokeless tobacco. He reports that he does not drink alcohol and does not use drugs.   Family History:  His family history includes Asthma in his father.   Allergies Allergies[1]   Home Medications  Prior to Admission medications  Medication Sig Start Date End Date Taking? Authorizing Provider  albuterol  (PROVENTIL ) (2.5 MG/3ML) 0.083% nebulizer solution Inhale 3 mLs into the lungs every 4 (four) hours as needed for wheezing or shortness of breath. 03/26/24  Yes Theotis Haze ORN, NP  albuterol  (VENTOLIN  HFA) 108 (90 Base) MCG/ACT inhaler Inhale 2 puffs into the lungs every 6 (six) hours as needed for wheezing or shortness of breath (when travelling). 09/07/23  Yes Kara Dorn NOVAK, MD  amoxicillin -clavulanate (AUGMENTIN ) 875-125 MG tablet Take 1 tablet by mouth every 12 (twelve) hours. 07/04/24  Yes Randol Simmonds, MD  budesonide  (PULMICORT ) 0.5 MG/2ML nebulizer solution Take 2 mLs (0.5 mg total) by nebulization 2 (two) times daily. Patient not taking: Reported on 07/14/2024 01/17/24   Kara Dorn NOVAK, MD  montelukast  (SINGULAIR ) 10 MG tablet Take 1 tablet (10 mg total) by mouth at bedtime. Patient not taking: Reported on 07/14/2024 03/26/24   Fleming, Zelda W, NP  predniSONE  (DELTASONE ) 1 MG tablet Take 1 tablet (1 mg total) by mouth daily with breakfast. Patient not taking: Reported on 07/14/2024 05/21/24   Kara Dorn NOVAK, MD  umeclidinium-vilanterol (ANORO ELLIPTA ) 62.5-25 MCG/ACT AEPB Inhale 1 puff into the lungs daily. Patient not taking: Reported on 07/14/2024 01/17/24   Kara Dorn NOVAK, MD     Critical care time:     Donnice JONELLE Beals, MD See TRACEY            [1]  Allergies Allergen Reactions   Porcine (Pork) Protein-Containing Drug Products     Religious   "

## 2024-07-16 NOTE — Plan of Care (Addendum)
 FMTS Interim Progress Note  S: to bedside w Dr. Jerrie RN message that pt having bout of hiccups causing some SOB / discomfort / pain Pt endorses this. Having frequent hiccups Has tried drinking a lot of fluids  O: BP 117/77 (BP Location: Right Arm)   Pulse (!) 135   Temp 98.3 F (36.8 C)   Resp 18   Ht 5' 11 (1.803 m)   Wt 55.8 kg   SpO2 97%   BMI 17.16 kg/m    Gen: conversational, NAD, speaking through hiccups in NAD but clearly bothered by them CV: tachycardic regular rhythm Pulm: tachypneic, frequent hiccups impacting air movement. No wheezing appreciated  A/P:  Severe hiccups causing respiratory distress and likely tachycardia   Trial baclofen  10mg  x 1  Added PRN thorazine  q8h if baclofen  unsuccessful or if symptoms occur again Discussed with pharmacy   Addendum 6:57 PM Message from RN that patient feeling warm and still tachycardic Afebrile Tachypneic and tachycardic  BP 90s/60s but MAP >70  Patient does endorse some palpitations and feeling warm  His hiccups improved after baclofen  though these symptoms did start after med admin. Denies itching/hives He denies chest pain, SOB, N/V, muscular pain, abd pain  On exam NAD but tachycardic, regular rhythm, tachypneic but lungs clear No hives or wheezing His skin is warm but temp 30F   His HR has been elevated all day though worsening now. Will obtain EKG - was sinus tachycardia earlier. Lower suspicion for ACS Also ordered IV metop x1 after EKG performed Consider fluid bolus and additional cardiac workup if not improving Also monitor for hives or symptoms of allergic rxn to baclofen  signed out to night team   Romelle Booty, MD 07/16/2024,  PGY-3, [facility] Family Medicine Service pager [phone]

## 2024-07-16 NOTE — Plan of Care (Signed)
 Patient persistently hypotensive despite 2 L LR boluses.  Also tachycardic and tachypneic.  Spoke with CCM on-call who will accept patient in transfer to ICU for pressors.  Updated patient and patient's RN.

## 2024-07-16 NOTE — Progress Notes (Addendum)
 "    Daily Progress Note Intern Pager: [phone]  Patient name: Shane Fleming Medical record number: [id number] Date of birth: 02/21/1971 Age: 54 y.o. Gender: male  Primary Care Provider: [facility], [place] Consultants: Pulmonology Code Status: Full Code   Pt Overview and Major Events to Date:  07/14/24 : Admitted   Shane Fleming is a 54YO male w/ PMH of severe persistent Asthma w/ eosinophil feathers Bronchiectasis ( he was on IL -5 therapy previously but has been losing access to [employer]), sever malnutrition, and s/p appendectomy in 2016. He has been admitted for pneumonia w/ bronchiectasis and acute on chronic cough Assessment & Plan Pneumonia Eosinophilic asthma Bronchiectasis (HCC) Chronic cough Pt feel worse w/ inc HR in last 24 Hr while Sat 96% on RA this AM Neg Resp panel , CTPE neg  - Pulmonology consulted, appreciate recommendations  - Stop Cefepime  2 g every 8 hours (07/14/24 - 07/17/23 ) d/t itching  - Starting UNASYN  ( 1/12 - 1/25) for total of 14 days ABX  - Lower respiratory sputum w/ + Klebsiella P.  - AFB culture pending collection   - Continue Prednisone  1 mg daily w/ breakfast (1/13 - 1/16)   - Continue ANORO ELLIPTA  1 puff daily   - Continue Singulair  10 mg Daily at bedtime  - Blood cx no growth  - Vitals per floor - AM BMP , TSH  Prediabetes Last A1C on 1/12 was 6.4 in from 6.1 in 9/25 BG in 100s  - Carb modified diet - CBG ACHS. May need SSI while on steroid if BG become elevated Protein-calorie malnutrition, severe Significant weight lose w/ poor PO intake in the last 6 months - Continue Ensure w/ meal - Dietitian consult and follow - Continue Regular diet  Hypophosphatemia Phos 2 yesterday - Sodium Phosphate  30 mmol IV given last night - AM Phos  Itching C/o generalize itching this AM. Per RN his itching seems to r/t IV ABX - Pharmacy consult for medication cause - Atarax  TID PRN started OVN - Will continue to monitor    FEN/GI: Regular diet  PPx: Xerelto 10 mg - No pork product  Dispo:Pending clinical improvement and PT/OT rec   Subjective:  Pt states feel worse last 24 hrs. He felt pain everywhere, endorse chest and both calf pain. States  I just dont feel well VS at bedside WNL, No change in EKG but has been in Sinus Tach ~100 -110 in last 24 HR    Objective: Temp:  [97.6 F (36.4 C)-98.5 F (36.9 C)] 98 F (36.7 C) (01/14 0742) Pulse Rate:  [101-110] 110 (01/14 0742) Resp:  [17-19] 18 (01/14 0742) BP: (106-131)/(65-72) 114/70 (01/14 0742) SpO2:  [93 %-97 %] 96 % (01/14 0742)  Physical Exam Cardiovascular:     Rate and Rhythm: Regular rhythm. Tachycardia present.  Pulmonary:     Breath sounds: Examination of the right-lower field reveals decreased breath sounds. Examination of the left-lower field reveals decreased breath sounds. Decreased breath sounds present.  Abdominal:     Palpations: Abdomen is soft.  Neurological:     General: No focal deficit present.     Mental Status: He is alert and oriented to person, place, and time.  Psychiatric:        Mood and Affect: Mood normal.        Behavior: Behavior normal.      Laboratory: Most recent CBC Lab Results  Component Value Date   WBC 8.8 07/16/2024   HGB  13.6 07/16/2024   HCT 40.0 07/16/2024   MCV 84.2 07/16/2024   PLT 389 07/16/2024   Most recent BMP    Latest Ref Rng & Units 07/16/2024    2:48 AM  BMP  Glucose 70 - 99 mg/dL 875   BUN 6 - 20 mg/dL 21   Creatinine 9.38 - 1.24 mg/dL 9.07   Sodium 864 - 854 mmol/L 129   Potassium 3.5 - 5.1 mmol/L 4.7   Chloride 98 - 111 mmol/L 95   CO2 22 - 32 mmol/L 28   Calcium  8.9 - 10.3 mg/dL 8.4     Suzen Elder B, DO 07/16/2024, 9:26 AM  PGY-1, [facility] FPTS Intern pager: [phone], text pages welcome Secure chat group [facility] Teaching Service   "

## 2024-07-17 ENCOUNTER — Inpatient Hospital Stay (HOSPITAL_COMMUNITY): Payer: Self-pay

## 2024-07-17 DIAGNOSIS — I509 Heart failure, unspecified: Secondary | ICD-10-CM

## 2024-07-17 DIAGNOSIS — I959 Hypotension, unspecified: Secondary | ICD-10-CM

## 2024-07-17 LAB — COMPREHENSIVE METABOLIC PANEL WITH GFR
ALT: 55 U/L — ABNORMAL HIGH (ref 0–44)
AST: 37 U/L (ref 15–41)
Albumin: 2.4 g/dL — ABNORMAL LOW (ref 3.5–5.0)
Alkaline Phosphatase: 63 U/L (ref 38–126)
Anion gap: 11 (ref 5–15)
BUN: 23 mg/dL — ABNORMAL HIGH (ref 6–20)
CO2: 27 mmol/L (ref 22–32)
Calcium: 8 mg/dL — ABNORMAL LOW (ref 8.9–10.3)
Chloride: 95 mmol/L — ABNORMAL LOW (ref 98–111)
Creatinine, Ser: 1.37 mg/dL — ABNORMAL HIGH (ref 0.61–1.24)
GFR, Estimated: >60 mL/min
Glucose, Bld: 109 mg/dL — ABNORMAL HIGH (ref 70–99)
Potassium: 4.7 mmol/L (ref 3.5–5.1)
Sodium: 132 mmol/L — ABNORMAL LOW (ref 135–145)
Total Bilirubin: 0.6 mg/dL (ref 0.0–1.2)
Total Protein: 5.7 g/dL — ABNORMAL LOW (ref 6.5–8.1)

## 2024-07-17 LAB — ECHOCARDIOGRAM COMPLETE
Area-P 1/2: 4.31 cm2
Height: 71 in
S' Lateral: 3.1 cm
Weight: 2151.69 [oz_av]

## 2024-07-17 LAB — GLUCOSE, CAPILLARY
Glucose-Capillary: 107 mg/dL — ABNORMAL HIGH (ref 70–99)
Glucose-Capillary: 127 mg/dL — ABNORMAL HIGH (ref 70–99)
Glucose-Capillary: 135 mg/dL — ABNORMAL HIGH (ref 70–99)
Glucose-Capillary: 135 mg/dL — ABNORMAL HIGH (ref 70–99)
Glucose-Capillary: 145 mg/dL — ABNORMAL HIGH (ref 70–99)
Glucose-Capillary: 208 mg/dL — ABNORMAL HIGH (ref 70–99)

## 2024-07-17 LAB — MRSA NEXT GEN BY PCR, NASAL: MRSA by PCR Next Gen: NOT DETECTED

## 2024-07-17 LAB — MAGNESIUM: Magnesium: 1.6 mg/dL — ABNORMAL LOW (ref 1.7–2.4)

## 2024-07-17 LAB — TSH: TSH: 2.75 u[IU]/mL (ref 0.350–4.500)

## 2024-07-17 LAB — PHOSPHORUS: Phosphorus: 3.3 mg/dL (ref 2.5–4.6)

## 2024-07-17 MED ORDER — MAGNESIUM SULFATE 2 GM/50ML IV SOLN
2.0000 g | Freq: Once | INTRAVENOUS | Status: AC
  Administered 2024-07-17: 2 g via INTRAVENOUS
  Filled 2024-07-17: qty 50

## 2024-07-17 MED ORDER — BACLOFEN 10 MG PO TABS
10.0000 mg | ORAL_TABLET | Freq: Three times a day (TID) | ORAL | Status: DC | PRN
  Administered 2024-07-17: 10 mg via ORAL
  Filled 2024-07-17: qty 1

## 2024-07-17 MED ORDER — ACETAMINOPHEN 325 MG PO TABS: 650.0000 mg | ORAL_TABLET | Freq: Four times a day (QID) | ORAL | Status: DC | PRN

## 2024-07-17 MED ORDER — BISACODYL 10 MG RE SUPP
10.0000 mg | Freq: Once | RECTAL | Status: AC
  Administered 2024-07-17: 10 mg via RECTAL
  Filled 2024-07-17: qty 1

## 2024-07-17 MED ORDER — ACETAMINOPHEN 325 MG PO TABS
650.0000 mg | ORAL_TABLET | Freq: Four times a day (QID) | ORAL | Status: DC | PRN
  Administered 2024-07-17: 650 mg via ORAL
  Filled 2024-07-17: qty 2

## 2024-07-17 MED ORDER — SODIUM CHLORIDE 0.9 % IV BOLUS
1000.0000 mL | Freq: Once | INTRAVENOUS | Status: AC
  Administered 2024-07-17: 1000 mL via INTRAVENOUS

## 2024-07-17 MED ORDER — CHLORHEXIDINE GLUCONATE CLOTH 2 % EX PADS
6.0000 | MEDICATED_PAD | Freq: Every day | CUTANEOUS | Status: DC
  Administered 2024-07-17: 6 via TOPICAL

## 2024-07-17 MED ORDER — SENNOSIDES-DOCUSATE SODIUM 8.6-50 MG PO TABS
2.0000 | ORAL_TABLET | Freq: Every day | ORAL | Status: DC
  Administered 2024-07-17 – 2024-07-20 (×4): 2 via ORAL
  Filled 2024-07-17 (×4): qty 2

## 2024-07-17 MED ORDER — LACTATED RINGERS IV BOLUS
500.0000 mL | Freq: Once | INTRAVENOUS | Status: AC
  Administered 2024-07-17: 500 mL via INTRAVENOUS

## 2024-07-17 NOTE — Discharge Instructions (Addendum)
"    Dear Shane Fleming,  Thank you for letting us  participate in your care. You were hospitalized for  Pneumonia. You were treated with antibiotics.  - You should continue to take your antibiotics twice a day until you run out. - you should also continue to take your steroid (Prednisone ) once a day until you see your Pulmonologist Dr. Kara; at that appointment ask him if you should keep taking this medicine.  POST-HOSPITAL & CARE INSTRUCTIONS Please complete your course of antibiotics as prescribed Review your updated medication list in detail and use medications as prescribed Go to your follow up appointments (listed below)  DOCTOR'S APPOINTMENT   Future Appointments  Date Time Provider Department Center  07/28/2024 11:10 AM Theotis Haze ORN, NP [facility] [address]  07/30/2024  1:00 PM Kara Dorn NOVAK, MD [facility] [address]  09/18/2024  9:30 AM Kara Dorn NOVAK, MD [facility] [phone] ORN Das    Follow-up Information     [facility], [place] Follow up in 3 day(s).   Contact information: [address] [place] [address] [phone]                 Take care and be well!  Family Medicine Teaching Service Inpatient Team [place]  [facility]  [address] [place], [place] [address] [phone] "

## 2024-07-17 NOTE — Progress Notes (Addendum)
 "  NAME:  Shane Fleming, MRN:  [id number], DOB:  28-Jan-1971, LOS: 3 ADMISSION DATE:  07/14/2024, CONSULTATION DATE:  07/17/24 REFERRING MD:  Family Medicine, CHIEF COMPLAINT:  cough   History of Present Illness:  54 year old woman history of asthma with eosinophilia lost access to Dupixent  with subsequently worsening disease and symptoms, bronchiectasis with acute on chronic cough.  Seen in clinic 05/2024.  Prednisone  taper.  Worsening cough for last 2 weeks.  Presented to ED 07/05/2023.  CT chest showed chronic bronchiectasis but new posterior right sided infiltrates concerning for pneumonia.  Placed on Augmentin  based on prior culture data.  He tells me he septally improved.  Cough is better than it was 1 2 weeks ago.  But still persistent cough.  This prompted presentation to the ED.  Chest x-ray looks unchanged to my eye, chronic infiltrates concerning for NTM disease and bronchiectatic changes.  Pertinent  Medical History  Severe persistent asthma with eosinophilic features, bronchiectasis  Significant Hospital Events: Including procedures, antibiotic start and stop dates in addition to other pertinent events   1/12: Admit to FMTS 1/15: Admit to ICU ST_>metoprolol ->hypotensive  Interim History / Subjective:  Admitted overnight for hypotension and started on Neo after receiving metoprolol  for ST. Echo this morning normal biventricular size/function; IVC collapsible->BP improved s/p 1L NS. Will re-bolus if unable to wean Neo. Will attempt to wean off pressors and can then transfer out.  Objective    Blood pressure 104/62, pulse 84, temperature 98.3 F (36.8 C), temperature source Oral, resp. rate (!) 25, height 5' 11 (1.803 m), weight 61 kg, SpO2 99%.        Intake/Output Summary (Last 24 hours) at 07/17/2024 1110 Last data filed at 07/17/2024 0700 Gross per 24 hour  Intake 1429.68 ml  Output 600 ml  Net 829.68 ml    Filed Weights   07/14/24 0603 07/17/24 0000  Weight: 55.8 kg 61  kg   General: acutely-ill, male lying in bed in NAD HEENT: AT/[place], PERRL, 2mm bilaterally Pulm: normal inspiratory and expiratory effort on [place] CV: RRR, no m/g/r GI: soft, non distended, non tender to palpation Neuro: A&O x3, no focal deficits   Resolved problem list   Assessment and Plan   Acute on chronic cough: Related to underlying poorly controlled asthma as well as bronchiectasis.  Recently improved with course of Augmentin  however only completed 7-day course d/t persistent cough/hiccups -- Continue airway clearance ordered  Sinus tachycardia; Qtc 462 Hypotension likely 2/2 to having received IV metoprolol  for ST -NSR now -Started on Neo gtt; wean to maintain MAP >65 -Echo showed collapsible IVC-->BP improved s/p 1L NS -Will re-bolus if unable to wean Neo  Bronchiectasis with exacerbation: Recent CT scan 1/2 with new infiltrates.  Prev treated with Augmentin ; however only cpmpleted 7-day course. 1/12: Culture + Klebsiella -- Started on Cefepime ; c/f med allergy  with rash development; started on Unasyn -->however patient reported the rash starting on Monday; unrelated to Cefepime . -- On discharge recommend Cefdinir to complete 14 days of abx  Severe persistent asthma with eosinophilia: Lost access to IL-5 therapy, biologic with worsening symptoms. -- Continue prednisone  for now, continue Anoro, continue Singulair   Acute Hypoxemia: ?OSA -- O2 goal 88%, wean as tolerated, if does not require O2 then can d/c without O2  VTE ppx: cont Xarelto    Labs   CBC: Recent Labs  Lab 07/14/24 0613 07/14/24 0736 07/15/24 0533 07/16/24 0248  WBC 9.5  --  8.2 8.8  HGB 14.3 15.3 12.9* 13.6  HCT  43.8 45.0 38.3* 40.0  MCV 86.1  --  84.0 84.2  PLT 452*  --  390 389    Basic Metabolic Panel: Recent Labs  Lab 07/14/24 0613 07/14/24 0736 07/15/24 0533 07/16/24 0248 07/17/24 0253  NA 133* 135 132* 129* 132*  K 4.3 3.9 4.0 4.7 4.7  CL 95*  --  96* 95* 95*  CO2 30  --  28 28 27    GLUCOSE 105*  --  98 124* 109*  BUN 14  --  23* 21* 23*  CREATININE 0.97  --  1.02 0.92 1.37*  CALCIUM  9.0  --  8.6* 8.4* 8.0*  MG  --   --   --  2.0 1.6*  PHOS  --   --   --  2.0* 3.3   GFR: Estimated Creatinine Clearance: 51.9 mL/min (A) (by C-G formula based on SCr of 1.37 mg/dL (H)). Recent Labs  Lab 07/14/24 0613 07/14/24 0736 07/15/24 0533 07/16/24 0248  WBC 9.5  --  8.2 8.8  LATICACIDVEN  --  1.7  --   --     Liver Function Tests: Recent Labs  Lab 07/14/24 0701 07/17/24 0253  AST 34 37  ALT 42 55*  ALKPHOS 75 63  BILITOT 0.4 0.6  PROT 7.6 5.7*  ALBUMIN 3.2* 2.4*   No results for input(s): LIPASE, AMYLASE in the last 168 hours. No results for input(s): AMMONIA in the last 168 hours.  ABG    Component Value Date/Time   PHART 7.387 02/11/2021 1146   PCO2ART 52.7 (H) 02/11/2021 1146   PO2ART 45 (L) 02/11/2021 1146   HCO3 30.3 (H) 07/14/2024 0736   TCO2 32 07/14/2024 0736   O2SAT 80 07/14/2024 0736     Coagulation Profile: Recent Labs  Lab 07/14/24 0701  INR 1.0    Cardiac Enzymes: No results for input(s): CKTOTAL, CKMB, CKMBINDEX, TROPONINI in the last 168 hours.  HbA1C: HbA1c, POC (prediabetic range)  Date/Time Value Ref Range Status  08/18/2022 10:04 AM 6.2 5.7 - 6.4 % Final  02/15/2022 11:36 AM 6.0 5.7 - 6.4 % Final   HbA1c, POC (controlled diabetic range)  Date/Time Value Ref Range Status  09/19/2023 09:34 AM 6.3 0.0 - 7.0 % Final   Hgb A1c MFr Bld  Date/Time Value Ref Range Status  07/14/2024 06:13 AM 6.4 (H) 4.8 - 5.6 % Final    Comment:    (NOTE) Diagnosis of Diabetes The following HbA1c ranges recommended by the American Diabetes Association (ADA) may be used as an aid in the diagnosis of diabetes mellitus.  Hemoglobin             Suggested A1C NGSP%              Diagnosis  <5.7                   Non Diabetic  5.7-6.4                Pre-Diabetic  >6.4                   Diabetic  <7.0                    Glycemic control for                       adults with diabetes.    03/26/2024 11:53 AM 6.1 (H) 4.8 - 5.6 % Final    Comment:  Prediabetes: 5.7 - 6.4          Diabetes: >6.4          Glycemic control for adults with diabetes: <7.0     CBG: Recent Labs  Lab 07/16/24 0753 07/16/24 1125 07/16/24 1646 07/17/24 0032 07/17/24 0746  GLUCAP 107* 133* 97 107* 135*    Review of Systems:   N/a  Past Medical History:  He,  has a past medical history of Asthma, Prediabetes, and Sinusitis (2018).   Surgical History:   Past Surgical History:  Procedure Laterality Date   APPENDECTOMY     BRONCHIAL WASHINGS  02/17/2021   Procedure: BRONCHIAL WASHINGS;  Surgeon: Kara Dorn NOVAK, MD;  Location[facility];  Service: Pulmonary;;   LAPAROSCOPIC APPENDECTOMY N/A 04/08/2015   Procedure: APPENDECTOMY LAPAROSCOPIC;  Surgeon: Morene Olives, MD;  Location: [facility] ORS;  Service: General;  Laterality: N/A;   VIDEO BRONCHOSCOPY N/A 02/17/2021   Procedure: VIDEO BRONCHOSCOPY WITHOUT FLUORO;  Surgeon: Kara Dorn NOVAK, MD;  Location: [facility] ENDOSCOPY;  Service: Pulmonary;  Laterality: N/A;     Social History:   reports that he has never smoked. He has never been exposed to tobacco smoke. He has never used smokeless tobacco. He reports that he does not drink alcohol and does not use drugs.   Family History:  His family history includes Asthma in his father.   Allergies Allergies[1]   Home Medications  Prior to Admission medications  Medication Sig Start Date End Date Taking? Authorizing Provider  albuterol  (PROVENTIL ) (2.5 MG/3ML) 0.083% nebulizer solution Inhale 3 mLs into the lungs every 4 (four) hours as needed for wheezing or shortness of breath. 03/26/24  Yes Theotis Haze ORN, NP  albuterol  (VENTOLIN  HFA) 108 (90 Base) MCG/ACT inhaler Inhale 2 puffs into the lungs every 6 (six) hours as needed for wheezing or shortness of breath (when travelling). 09/07/23  Yes Kara Dorn NOVAK,  MD  amoxicillin -clavulanate (AUGMENTIN ) 875-125 MG tablet Take 1 tablet by mouth every 12 (twelve) hours. 07/04/24  Yes Randol Simmonds, MD  budesonide  (PULMICORT ) 0.5 MG/2ML nebulizer solution Take 2 mLs (0.5 mg total) by nebulization 2 (two) times daily. Patient not taking: Reported on 07/14/2024 01/17/24   Kara Dorn NOVAK, MD  montelukast  (SINGULAIR ) 10 MG tablet Take 1 tablet (10 mg total) by mouth at bedtime. Patient not taking: Reported on 07/14/2024 03/26/24   Fleming, Zelda W, NP  predniSONE  (DELTASONE ) 1 MG tablet Take 1 tablet (1 mg total) by mouth daily with breakfast. Patient not taking: Reported on 07/14/2024 05/21/24   Kara Dorn NOVAK, MD  umeclidinium-vilanterol (ANORO ELLIPTA ) 62.5-25 MCG/ACT AEPB Inhale 1 puff into the lungs daily. Patient not taking: Reported on 07/14/2024 01/17/24   Kara Dorn NOVAK, MD     Critical care time: 62 minutes    Hulda Reddix, DNP, AGACNP-BC [facility]  Please see [email or web address] for pager details.  From 7A-7P if no response, please call [phone]. After hours, please call ELink [phone].     [1]  Allergies Allergen Reactions   Porcine (Pork) Protein-Containing Drug Products     Religious   "

## 2024-07-17 NOTE — Progress Notes (Signed)
 eLink Physician-Brief Progress Note Patient Name: Shane Fleming DOB: 06/21/1971 MRN: [id number]   Date of Service  07/17/2024  HPI/Events of Note  eICU Brief new admit note: Pt transferring to ICU for hypotension and tachycardia. Pt was initially tachycardic, given metoprolol  and remained tachycardic (EKG with sinus tach) but now hypotensive. Given 2L IVF without improvement.   -transferring to ICU for peripheral pressor 54 yr old male admitted for AHRF from Bronchiectasis exacerbation, severe persistent asthma with eosinophilia . Klebsiella PNA on sputum culture, on unasyn .   Camera: Tall, lean built, malnourished  sinus tachy 120's. On nasal o2 , sats 94 to 98%. 127/110, temp 38.1 Not needing neos ynephrine  in ICU yet, hypertensive now. Discussed with RN.  Data: Covid, flu neg LA 1.7 Hyponatremia at 129 Cr normal Albumin 3.2 12 th ABG 7.41/46 CBC normal.  EKG: sinus tachycardia, qtc 462. CTA chest reviewed. No PE.COP vs eosinophilic pneumonia?. Bronchiectasis.  EF 55% from 2022 BAL from 2022:  07/14/2024: sputum:  klebsiella pneumonia.  Blood culture x 2 so far no growth from 12 th admit. PFT from 2023: very severe obstructive lung disease with low FVC also.TLC 74% no BDR.    eICU Interventions  Continue current care Call back if MAP drops. Get TSH once Consider Bronchoscopy BAL . Consider EGPA /ABPA/Non tuberculosis infection  work up if not better.   VTE on rivaroxaban . CBG goals < 180, on steroids. On nebs, 3% nebs daily.    -[name], MD [phone]      Intervention Category Major Interventions: Hypotension - evaluation and management;Arrhythmia - evaluation and management Evaluation Type: New Patient Evaluation  Jodelle [name] 07/17/2024, 12:35 AM

## 2024-07-17 NOTE — Progress Notes (Signed)
 Patient was notably more itchy after receiving unasyn .

## 2024-07-17 NOTE — Progress Notes (Addendum)
 eLink Physician-Brief Progress Note Patient Name: Shane Fleming DOB: 08/08/1970 MRN: [id number]   Date of Service  07/17/2024  HPI/Events of Note  Notified of sinus tachycardia with rate in the 130s. Pt is afebrile and not in pain.  He remains on neosynephrine at 25mcg/min.   HR has been climbing most of the day.   PT had more relief from baclofen  vs thorazine  with regards to hiccups.   eICU Interventions  Give 500cc LR bolus.  Discontinue thorazine .  Baclofen  ordered PRN.     Intervention Category Intermediate Interventions: Other:  Shanda Busman 07/17/2024, 8:28 PM  11:43 PM No change in HR, still in the 130s.  Pt now with fever temp of 101F.   Plan> Give tylenol .  Continue Unasyn .  Continue to monitor HR.

## 2024-07-17 NOTE — TOC CM/SW Note (Addendum)
 Transition of Care [facility]) - Inpatient Brief Assessment   Patient Details  Name: Shane Fleming MRN: [id number] Date of Birth: 01/20/1971  [facility] of [facility]) CM/SW Contact:    Tom-Johnson, Harvest Muskrat, RN Phone Number: 07/17/2024, 1:39 PM   Clinical Narrative:  Patient presented to the ED with worsening Shortness of breath, productive Cough and Hiccups.  Chest X-ray concerning for Pneumonia. On IV abx, Phenylephrine , Thorazine  for the Hiccups and Neb tx. Currently on 3L O2 acute, does not use home O2. Patient was recently diagnosed with Pneumonia and started on Augmentin . Patient has hx of severe Eosinophilic Asthma, Bronchiectasis, Prediabetes. Followed by Outpatient Pulmonology Clinic.  CM spoke with patient at bedside about needs for post hospital transition. Patient states he lives alone. Originally from [place]. States his wife, children and immediate family members are in [place]. Has close friends that assists him Financially. 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 Has a car and drives self. Referral sent to Financial Counseling for Medicaid and Disability screening. MATCH will be done at discharge to assist with Prescription Medications. Patient states he has a PCP but could not remember the name. [facility] is listed o Epic, CM called and spoke with Sherrell [phone]). Sherrell states patient has not seen someone in their practice for the last couple of years. PCP appointment will be schedule when patient is Medically ready for discharge.    Patient not Medically ready for discharge.  CM will continue to follow as patient progresses with care towards discharge.          Transition of Care Asessment: Insurance and Status: Insurance coverage has been reviewed (Does not have Aeronautical Engineer, referral sent to [employer].) Patient has primary care physician: Yes Home environment has been reviewed: Yes Prior level of  function:: Independent Prior/Current Home Services: No current home services Social Drivers of Health Review: SDOH reviewed no interventions necessary Readmission risk has been reviewed: Yes Transition of care needs: transition of care needs identified, TOC will continue to follow

## 2024-07-17 NOTE — Progress Notes (Signed)
 2204:Notified the provider that pt. Manual BP 78/50 MAP 59 after the first bolus. PT. A/Ox 4 and arousable. He is warm to the touch but temp. is 98.6 he says he is cold.   2309: Notified the provider that the pt. has completed the second 1L LR bolus BP 80/47 MAP 58 pulse 123 respiration 40. This is his second red mews can we get him transferred to another unit. He is grunting. the patient denies pain and difficulty breathing. He states that he just doesn't feel right.   2317: Called report to Nurse taking 3M11 ICU.   2325:Patient was transported to 3M11 ICU by nurse and charge nurse. Pt. Is alert and oriented x4 on 2L [place].

## 2024-07-17 NOTE — Progress Notes (Signed)
 Echocardiogram 2D Echocardiogram has been performed.  Juliene JINNY Rucks 07/17/2024, 8:37 AM

## 2024-07-18 ENCOUNTER — Inpatient Hospital Stay (HOSPITAL_COMMUNITY): Payer: Self-pay

## 2024-07-18 LAB — BASIC METABOLIC PANEL WITH GFR
Anion gap: 6 (ref 5–15)
BUN: 17 mg/dL (ref 6–20)
CO2: 27 mmol/L (ref 22–32)
Calcium: 7.7 mg/dL — ABNORMAL LOW (ref 8.9–10.3)
Chloride: 103 mmol/L (ref 98–111)
Creatinine, Ser: 1 mg/dL (ref 0.61–1.24)
GFR, Estimated: >60 mL/min
Glucose, Bld: 96 mg/dL (ref 70–99)
Potassium: 4.3 mmol/L (ref 3.5–5.1)
Sodium: 136 mmol/L (ref 135–145)

## 2024-07-18 LAB — CBC
HCT: 40.5 % (ref 39.0–52.0)
Hemoglobin: 13.6 g/dL (ref 13.0–17.0)
MCH: 28.8 pg (ref 26.0–34.0)
MCHC: 33.6 g/dL (ref 30.0–36.0)
MCV: 85.8 fL (ref 80.0–100.0)
Platelets: 342 K/uL (ref 150–400)
RBC: 4.72 MIL/uL (ref 4.22–5.81)
RDW: 15.4 % (ref 11.5–15.5)
WBC: 18.8 K/uL — ABNORMAL HIGH (ref 4.0–10.5)
nRBC: 0 % (ref 0.0–0.2)

## 2024-07-18 LAB — GLUCOSE, CAPILLARY
Glucose-Capillary: 106 mg/dL — ABNORMAL HIGH (ref 70–99)
Glucose-Capillary: 93 mg/dL (ref 70–99)
Glucose-Capillary: 100 mg/dL — ABNORMAL HIGH (ref 70–99)

## 2024-07-18 LAB — MAGNESIUM: Magnesium: 2.5 mg/dL — ABNORMAL HIGH (ref 1.7–2.4)

## 2024-07-18 MED ORDER — KETOTIFEN FUMARATE 0.035 % OP SOLN
1.0000 [drp] | Freq: Two times a day (BID) | OPHTHALMIC | Status: DC
  Administered 2024-07-18 – 2024-07-21 (×6): 1 [drp] via OPHTHALMIC
  Filled 2024-07-18: qty 5

## 2024-07-18 MED ORDER — ORAL CARE MOUTH RINSE: 15.0000 mL | OROMUCOSAL | Status: DC | PRN

## 2024-07-18 MED ORDER — AMOXICILLIN-POT CLAVULANATE 875-125 MG PO TABS
1.0000 | ORAL_TABLET | Freq: Two times a day (BID) | ORAL | Status: DC
  Administered 2024-07-18 – 2024-07-21 (×7): 1 via ORAL
  Filled 2024-07-18 (×8): qty 1

## 2024-07-18 MED ORDER — SODIUM CHLORIDE 0.9 % IV SOLN
25.0000 mg | Freq: Once | INTRAVENOUS | Status: AC
  Administered 2024-07-18: 25 mg via INTRAVENOUS
  Filled 2024-07-18: qty 1

## 2024-07-18 MED ORDER — HYDROCERIN EX CREA
1.0000 | TOPICAL_CREAM | Freq: Two times a day (BID) | CUTANEOUS | Status: DC
  Administered 2024-07-18 – 2024-07-21 (×7): 1 via TOPICAL
  Filled 2024-07-18 (×2): qty 113

## 2024-07-18 MED ORDER — GUAIFENESIN-DM 100-10 MG/5ML PO SYRP
5.0000 mL | ORAL_SOLUTION | ORAL | Status: DC | PRN
  Administered 2024-07-18 – 2024-07-21 (×5): 5 mL via ORAL
  Filled 2024-07-18 (×6): qty 5

## 2024-07-18 MED ORDER — BISACODYL 10 MG RE SUPP
10.0000 mg | Freq: Once | RECTAL | Status: AC
  Administered 2024-07-18: 10 mg via RECTAL
  Filled 2024-07-18: qty 1

## 2024-07-18 MED ORDER — CALCIUM GLUCONATE-NACL 2-0.675 GM/100ML-% IV SOLN
2.0000 g | Freq: Once | INTRAVENOUS | Status: AC
  Administered 2024-07-18: 2000 mg via INTRAVENOUS
  Filled 2024-07-18: qty 100

## 2024-07-18 MED ORDER — BACLOFEN 10 MG PO TABS
5.0000 mg | ORAL_TABLET | Freq: Three times a day (TID) | ORAL | Status: DC
  Administered 2024-07-18 – 2024-07-21 (×10): 5 mg via ORAL
  Filled 2024-07-18 (×10): qty 1

## 2024-07-18 NOTE — Progress Notes (Signed)
 PCCM UPDATE:  RN reported patient had blurry vision, itchy, and watery eyes. Likely related to atopic dermatitis that was first noticed 07/14/24; etiology unclear (medication-related such as from antibiotics or CHG baths or due to another cause). Added antihistamine eye drops and listed CHG as allergy . Antibiotics switched today. Will need monitoring and if the rash does not improve, further workup will be needed to evaluate possible etiologies.  Exam: PERRL 3mm bilaterally Bilateral conjunctival injection noted R pupil w/ corneal arcus  L pupil w/ prior cataract Visual fields grossly intact, some blurriness noted during testing  Shane Shade, DNP, AGACNP-BC [facility]  Please see [email or web address] for pager details.  From 7A-7P if no response, please call [phone]. After hours, please call ELink [phone].

## 2024-07-18 NOTE — Progress Notes (Signed)
 Patient transferred to 4M west room 7, without any complications all belongings place at bedside.

## 2024-07-18 NOTE — Progress Notes (Signed)
 "  NAME:  Shane Fleming, MRN:  [id number], DOB:  [birth year], LOS: 4 ADMISSION DATE:  07/14/2024, CONSULTATION DATE:  07/18/24 REFERRING MD:  Family Medicine, CHIEF COMPLAINT:  cough   History of Present Illness:  54 year old woman history of asthma with eosinophilia lost access to Dupixent  with subsequently worsening disease and symptoms, bronchiectasis with acute on chronic cough.  Seen in clinic 05/2024.  Prednisone  taper.  Worsening cough for last 2 weeks.  Presented to ED 07/05/2023.  CT chest showed chronic bronchiectasis but new posterior right sided infiltrates concerning for pneumonia.  Placed on Augmentin  based on prior culture data.  He tells me he septally improved.  Cough is better than it was 1 2 weeks ago.  But still persistent cough.  This prompted presentation to the ED.  Chest x-ray looks unchanged to my eye, chronic infiltrates concerning for NTM disease and bronchiectatic changes.  Pertinent  Medical History  Severe persistent asthma with eosinophilic features, bronchiectasis  Significant Hospital Events: Including procedures, antibiotic start and stop dates in addition to other pertinent events   1/12: Admit to FMTS 1/15: Admit to ICU ST_>metoprolol ->hypotensive  Interim History / Subjective:  NAEO. Off pressors. BP a little soft when he sleeps, however MAPs fine. Believe this is largely in part to his fluid status as he reports poor intake, encouraged intake of more fluids throughout the day.  Patient reports continued pruritus after Unasyn . He has done well in the past taking Augmentin , however reports not fully completing past courses due to hiccups. Encouraged him to take his full course of antibiotics. Changing his antibiotics back to Augmentin  and likely need an extended course. Likely will need a bronchoscopy in the future for culture data to rule out Actinomyces as prior culture data was +GPC however never speciated and reports feeling better when on antibiotics. Hiccups  have been a barrier to taking medication in the past, reports improvement with baclofen , scheduling low dose baclofen .  Objective    Blood pressure 106/66, pulse (!) 101, temperature 98.2 F (36.8 C), temperature source Oral, resp. rate (!) 23, height 5' 11 (1.803 m), weight 61 kg, SpO2 98%.    FiO2 (%):  [21 %] 21 %   Intake/Output Summary (Last 24 hours) at 07/18/2024 0816 Last data filed at 07/18/2024 0409 Gross per 24 hour  Intake 4413.02 ml  Output 2150 ml  Net 2263.02 ml    Filed Weights   07/14/24 0603 07/17/24 0000  Weight: 55.8 kg 61 kg   General: acute on chronically-ill, male lying in bed in NAD HEENT: AT/[place], PERRL, 2mm bilaterally Pulm: normal inspiratory and expiratory effort on [place] CV: RRR, no m/g/r GI: soft, non distended, non tender to palpation Integumentary: scattered urticaria, erythema noted to BUE Neuro: A&O x3, no focal deficits   Resolved problem list  Sinus tachycardia; Qtc 462 Hypotension likely 2/2 to having received IV metoprolol  for ST Assessment and Plan   Acute on chronic cough: Related to underlying poorly controlled asthma as well as bronchiectasis.  Recently improved with course of Augmentin  however only completed 7-day course d/t persistent cough/hiccups -- Continue airway clearance ordered --Baclofen  5mg  scheduled; patient reports improvement in hiccups  Sinus tachycardia; Qtc 462 Hypotension likely 2/2 to having received IV metoprolol  for ST -NSR now -Neo gtt off since midnight; maintaining MAP >65 -Echo showed collapsible IVC-->BP improved s/p 2L NS bolus -Encourage fluid intake  Bronchiectasis with exacerbation: Recent CT scan 1/2 with new infiltrates.  Prev treated with Augmentin ; however only cpmpleted 7-day course.  1/12: Culture + Klebsiella -- Started on Cefepime ; c/f med allergy  with rash development; started on Unasyn -->however patient reported the rash starting on Monday; unrelated to Cefepime . -Stopped Unasyn  today d/t reports  of pruritus after administration; starting Augmentin  for extended course -Likely will need a bronchoscopy in the future for culture data to rule out Actinomyces as prior culture data was +GPC however never speciated and reports feeling better when on antibiotics.  Severe persistent asthma with eosinophilia: Lost access to IL-5 therapy, biologic with worsening symptoms. -- Continue prednisone  for now, continue Anoro, continue Singulair   Acute Hypoxemia: ?OSA -- O2 goal 88%, wean as tolerated, if does not require O2 then can d/c without O2  VTE ppx: cont Xarelto    Labs   CBC: Recent Labs  Lab 07/14/24 0613 07/14/24 0736 07/15/24 0533 07/16/24 0248  WBC 9.5  --  8.2 8.8  HGB 14.3 15.3 12.9* 13.6  HCT 43.8 45.0 38.3* 40.0  MCV 86.1  --  84.0 84.2  PLT 452*  --  390 389    Basic Metabolic Panel: Recent Labs  Lab 07/14/24 0613 07/14/24 0736 07/15/24 0533 07/16/24 0248 07/17/24 0253 07/18/24 0543  NA 133* 135 132* 129* 132* 136  K 4.3 3.9 4.0 4.7 4.7 4.3  CL 95*  --  96* 95* 95* 103  CO2 30  --  28 28 27 27   GLUCOSE 105*  --  98 124* 109* 96  BUN 14  --  23* 21* 23* 17  CREATININE 0.97  --  1.02 0.92 1.37* 1.00  CALCIUM  9.0  --  8.6* 8.4* 8.0* 7.7*  MG  --   --   --  2.0 1.6* 2.5*  PHOS  --   --   --  2.0* 3.3  --    GFR: Estimated Creatinine Clearance: 71.2 mL/min (by C-G formula based on SCr of 1 mg/dL). Recent Labs  Lab 07/14/24 0613 07/14/24 0736 07/15/24 0533 07/16/24 0248  WBC 9.5  --  8.2 8.8  LATICACIDVEN  --  1.7  --   --     Liver Function Tests: Recent Labs  Lab 07/14/24 0701 07/17/24 0253  AST 34 37  ALT 42 55*  ALKPHOS 75 63  BILITOT 0.4 0.6  PROT 7.6 5.7*  ALBUMIN 3.2* 2.4*   No results for input(s): LIPASE, AMYLASE in the last 168 hours. No results for input(s): AMMONIA in the last 168 hours.  ABG    Component Value Date/Time   PHART 7.387 02/11/2021 1146   PCO2ART 52.7 (H) 02/11/2021 1146   PO2ART 45 (L) 02/11/2021 1146    HCO3 30.3 (H) 07/14/2024 0736   TCO2 32 07/14/2024 0736   O2SAT 80 07/14/2024 0736     Coagulation Profile: Recent Labs  Lab 07/14/24 0701  INR 1.0    Cardiac Enzymes: No results for input(s): CKTOTAL, CKMB, CKMBINDEX, TROPONINI in the last 168 hours.  HbA1C: HbA1c, POC (prediabetic range)  Date/Time Value Ref Range Status  08/18/2022 10:04 AM 6.2 5.7 - 6.4 % Final  02/15/2022 11:36 AM 6.0 5.7 - 6.4 % Final   HbA1c, POC (controlled diabetic range)  Date/Time Value Ref Range Status  09/19/2023 09:34 AM 6.3 0.0 - 7.0 % Final   Hgb A1c MFr Bld  Date/Time Value Ref Range Status  07/14/2024 06:13 AM 6.4 (H) 4.8 - 5.6 % Final    Comment:    (NOTE) Diagnosis of Diabetes The following HbA1c ranges recommended by the American Diabetes Association (ADA) may be used as  an aid in the diagnosis of diabetes mellitus.  Hemoglobin             Suggested A1C NGSP%              Diagnosis  <5.7                   Non Diabetic  5.7-6.4                Pre-Diabetic  >6.4                   Diabetic  <7.0                   Glycemic control for                       adults with diabetes.    03/26/2024 11:53 AM 6.1 (H) 4.8 - 5.6 % Final    Comment:             Prediabetes: 5.7 - 6.4          Diabetes: >6.4          Glycemic control for adults with diabetes: <7.0     CBG: Recent Labs  Lab 07/17/24 1556 07/17/24 1956 07/17/24 2340 07/18/24 0317 07/18/24 0733  GLUCAP 145* 127* 135* 106* 93    Review of Systems:   N/a  Past Medical History:  He,  has a past medical history of Asthma, Prediabetes, and Sinusitis (2018).   Surgical History:   Past Surgical History:  Procedure Laterality Date   APPENDECTOMY     BRONCHIAL WASHINGS  02/17/2021   Procedure: BRONCHIAL WASHINGS;  Surgeon: Kara Dorn NOVAK, MD;  Location: [facility] ENDOSCOPY;  Service: Pulmonary;;   LAPAROSCOPIC APPENDECTOMY N/A 04/08/2015   Procedure: APPENDECTOMY LAPAROSCOPIC;  Surgeon: Morene Olives,  MD;  Location: [facility] ORS;  Service: General;  Laterality: N/A;   VIDEO BRONCHOSCOPY N/A 02/17/2021   Procedure: VIDEO BRONCHOSCOPY WITHOUT FLUORO;  Surgeon: Kara Dorn NOVAK, MD;  Location: [facility] ENDOSCOPY;  Service: Pulmonary;  Laterality: N/A;     Social History:   reports that he has never smoked. He has never been exposed to tobacco smoke. He has never used smokeless tobacco. He reports that he does not drink alcohol and does not use drugs.   Family History:  His family history includes Asthma in his father.   Allergies Allergies[1]   Home Medications  Prior to Admission medications  Medication Sig Start Date End Date Taking? Authorizing Provider  albuterol  (PROVENTIL ) (2.5 MG/3ML) 0.083% nebulizer solution Inhale 3 mLs into the lungs every 4 (four) hours as needed for wheezing or shortness of breath. 03/26/24  Yes Theotis Haze ORN, NP  albuterol  (VENTOLIN  HFA) 108 (90 Base) MCG/ACT inhaler Inhale 2 puffs into the lungs every 6 (six) hours as needed for wheezing or shortness of breath (when travelling). 09/07/23  Yes Kara Dorn NOVAK, MD  amoxicillin -clavulanate (AUGMENTIN ) 875-125 MG tablet Take 1 tablet by mouth every 12 (twelve) hours. 07/04/24  Yes Randol Simmonds, MD  budesonide  (PULMICORT ) 0.5 MG/2ML nebulizer solution Take 2 mLs (0.5 mg total) by nebulization 2 (two) times daily. Patient not taking: Reported on 07/14/2024 01/17/24   Kara Dorn NOVAK, MD  montelukast  (SINGULAIR ) 10 MG tablet Take 1 tablet (10 mg total) by mouth at bedtime. Patient not taking: Reported on 07/14/2024 03/26/24   Fleming, Zelda W, NP  predniSONE  (DELTASONE ) 1 MG tablet Take 1 tablet (1 mg total) by  mouth daily with breakfast. Patient not taking: Reported on 07/14/2024 05/21/24   Kara Dorn NOVAK, MD  umeclidinium-vilanterol (ANORO ELLIPTA ) 62.5-25 MCG/ACT AEPB Inhale 1 puff into the lungs daily. Patient not taking: Reported on 07/14/2024 01/17/24   Kara Dorn NOVAK, MD    Warren Shade, DNP, AGACNP-BC [facility]  Please see [email or web address] for pager details.  From 7A-7P if no response, please call [phone]. After hours, please call ELink [phone].     [1]  Allergies Allergen Reactions   Porcine (Pork) Protein-Containing Drug Products     Religious   "

## 2024-07-18 NOTE — Plan of Care (Signed)
  Problem: Pain Managment: Goal: General experience of comfort will improve and/or be controlled Outcome: Progressing   Problem: Safety: Goal: Ability to remain free from injury will improve Outcome: Progressing

## 2024-07-18 NOTE — Progress Notes (Signed)
 Nutrition Follow-up  DOCUMENTATION CODES:  Severe malnutrition in context of chronic illness, Underweight  INTERVENTION:  Continue regular diet as ordered Ensure Plus High Protein po TID, each supplement provides 350 kcal and 20 grams of protein. Continue MVI with minerals daily  NUTRITION DIAGNOSIS:  Severe Malnutrition related to chronic illness (bronchiectasis) as evidenced by severe fat depletion, severe muscle depletion, energy intake < 75% for > 7 days, percent weight loss. - remains applicable  GOAL:  Patient will meet greater than or equal to 90% of their needs - progressing though likely unmet, addressing via meals and nutrition supplements  MONITOR:  PO intake, Supplement acceptance  REASON FOR ASSESSMENT:  Consult Assessment of nutrition requirement/status  ASSESSMENT:  [name] presenting w/ PMH of severe persistent Asthma w/ eosinophilia features Bronchiectasis ( On IL-5 therapy but has been losing access to Dupixant) ,Severe malnutrition, and s/p appendectomy in 2016. He has been admitted for pneumonia w/ bronchiectasis and acute on chronic cough.  Transferred to ICU 1/15 with hypotension requiring pressor support. Suspected degree of hypotension r/t poor oral intake. Encourage fluid intake.   Checked in with patient at bedside. He is in good spirits.  Reports continued poor appetite. He mentions waking up in the middle of the night feeling hungry for which he keeps fruits at bedside to snacks on when he gets hungry.   RN reports that he ate most of his breakfast this morning.  Pt states that he has been trying to drink water as much as able. He had Ensure protein supplement at bedside. Pt reports that he has been drinking one daily.   Review of MAR reflects pt has 3 scheduled and he has been receiving them 3 times daily. Unclear how much pt is actually consuming.   Meal completions: 1/14: 75% x 3 recorded meals 1/15: 0% dinner  Admit weight: 55.8 kg  (?accuracy-appears to be stated) Current weight: 61 kg  UOP: x24 hours + 2 unmeasured occurrences   Medications: MVI, miralax  daily, prednisone , senna daily  Labs:  Calcium  7.7 (no updated albumin to correct for) Mg 2.5 CBG's 93-135 x24 hours  Diet Order:   Diet Order             Diet regular Room service appropriate? Yes; Fluid consistency: Thin  Diet effective now                   EDUCATION NEEDS:  Education needs have been addressed  Skin:  Skin Assessment: Reviewed RN Assessment  Last BM:  1/16 type 2 small  Height:  Ht Readings from Last 1 Encounters:  07/14/24 5' 11 (1.803 m)    Weight:  Wt Readings from Last 1 Encounters:  07/17/24 61 kg   BMI:  Body mass index is 18.76 kg/m.  Estimated Nutritional Needs:   Kcal:  1800-2000  Protein:  90-105g  Fluid:  2L/day  Shane Fleming, RDN, LDN Clinical Nutrition See AMiON for contact information.

## 2024-07-19 DIAGNOSIS — A419 Sepsis, unspecified organism: Principal | ICD-10-CM

## 2024-07-19 DIAGNOSIS — J479 Bronchiectasis, uncomplicated: Secondary | ICD-10-CM

## 2024-07-19 LAB — BASIC METABOLIC PANEL WITH GFR
Anion gap: 9 (ref 5–15)
BUN: 14 mg/dL (ref 6–20)
CO2: 28 mmol/L (ref 22–32)
Calcium: 8.4 mg/dL — ABNORMAL LOW (ref 8.9–10.3)
Chloride: 100 mmol/L (ref 98–111)
Creatinine, Ser: 0.94 mg/dL (ref 0.61–1.24)
GFR, Estimated: >60 mL/min
Glucose, Bld: 121 mg/dL — ABNORMAL HIGH (ref 70–99)
Potassium: 3.8 mmol/L (ref 3.5–5.1)
Sodium: 137 mmol/L (ref 135–145)

## 2024-07-19 LAB — CULTURE, BLOOD (ROUTINE X 2)
Culture: NO GROWTH
Culture: NO GROWTH

## 2024-07-19 LAB — CBC
HCT: 35.3 % — ABNORMAL LOW (ref 39.0–52.0)
Hemoglobin: 11.8 g/dL — ABNORMAL LOW (ref 13.0–17.0)
MCH: 28.3 pg (ref 26.0–34.0)
MCHC: 33.4 g/dL (ref 30.0–36.0)
MCV: 84.7 fL (ref 80.0–100.0)
Platelets: 319 K/uL (ref 150–400)
RBC: 4.17 MIL/uL — ABNORMAL LOW (ref 4.22–5.81)
RDW: 15.3 % (ref 11.5–15.5)
WBC: 15.3 K/uL — ABNORMAL HIGH (ref 4.0–10.5)
nRBC: 0 % (ref 0.0–0.2)

## 2024-07-19 LAB — PHOSPHORUS: Phosphorus: 1.8 mg/dL — ABNORMAL LOW (ref 2.5–4.6)

## 2024-07-19 LAB — GLUCOSE, CAPILLARY
Glucose-Capillary: 143 mg/dL — ABNORMAL HIGH (ref 70–99)
Glucose-Capillary: 137 mg/dL — ABNORMAL HIGH (ref 70–99)
Glucose-Capillary: 115 mg/dL — ABNORMAL HIGH (ref 70–99)
Glucose-Capillary: 104 mg/dL — ABNORMAL HIGH (ref 70–99)

## 2024-07-19 LAB — MAGNESIUM: Magnesium: 1.9 mg/dL (ref 1.7–2.4)

## 2024-07-19 LAB — EXPECTORATED SPUTUM ASSESSMENT W GRAM STAIN, RFLX TO RESP C

## 2024-07-19 MED ORDER — HYDROCORTISONE 1 % EX CREA
1.0000 | TOPICAL_CREAM | Freq: Two times a day (BID) | CUTANEOUS | Status: DC
  Administered 2024-07-19 – 2024-07-21 (×5): 1 via TOPICAL
  Filled 2024-07-19: qty 28

## 2024-07-19 NOTE — Assessment & Plan Note (Signed)
 Doing well on RA this morning - Pulmonology consulted, appreciate recommendations  - Completed Cefepime  2 g every 8 hours (07/14/24 - 07/17/23 ) d/t itching  - Transitioned form UNASYN  ( 1/12 - 1/16) to Aiugmentin on 07/18/24 d/t rash c/o allergic reaction - Continue Augmentin  875-125 mg Q12 Hrs (1/16-1/25 ) for total of 14 days ABX  - Lower respiratory sputum w/ + Klebsiella P w/ concerned for Actinomyces   - Possible Bronch during this admission  - AFB culture still pending on collection   - Continue Prednisone  1 mg daily w/ breakfast (1/13 - 1/16)   - Continue ANORO ELLIPTA  1 puff daily   - Continue Singulair  10 mg Daily at bedtime  - Blood cx no growth  - Vitals per floor - AM BMP

## 2024-07-19 NOTE — Assessment & Plan Note (Signed)
 C/o intermittent hicc-up during round. Was given Thorazine  IV during ICU admission - Continue Baclofen 

## 2024-07-19 NOTE — Evaluation (Signed)
 Physical Therapy Evaluation & Discharge Patient Details Name: Shane Fleming MRN: [id number] DOB: Nov 15, 1970 Today's Date: 07/19/2024  History of Present Illness  54 y.o. male admitted 07/14/24 with worsening SOB and cough, recent PNA dx. Workup for CAP, bronchiectasis with exacerbation. Course complicated by hypotension, tachycardia 1/14 requiring transfer to ICU; suspect anaphylactic vs medication-induced shock. PMH includes severe eosinophilic asthma, bronchiectasis, prediabetes.   Clinical Impression  Patient evaluated by Physical Therapy with no further acute PT needs identified. PTA, pt indep, lives alone. Today, pt moving well, able to transfer and ambulate in hallway without assist; DOE 2/4, nonproductive cough, asymptomatic hypotension. All education has been completed and the patient has no further questions. Recommend continued ambulation with nursing staff and mobility specialist. Acute PT is signing off. Thank you for this referral.  HR 120s; SpO2 95% on RA Sitting BP 104/67 Standing BP 91/61 Standing 2-min BP 93/63 Post-ambulation BP 95/56    If plan is discharge home, recommend the following: Assist for transportation   Can travel by private vehicle    Yes    Equipment Recommendations None recommended by PT  Recommendations for Other Services   Mobility Specialist    Functional Status Assessment Patient has had a recent decline in their functional status and demonstrates the ability to make significant improvements in function in a reasonable and predictable amount of time.     Precautions / Restrictions Precautions Precautions: Fall;Other (comment) Recall of Precautions/Restrictions: Intact Precaution/Restrictions Comments: watch BP, HR Restrictions Weight Bearing Restrictions Per Provider Order: No      Mobility  Bed Mobility Overal bed mobility: Modified Independent             General bed mobility comments: HOB elevated    Transfers Overall  transfer level: Independent Equipment used: None               General transfer comment: multiple sit<>stands from EOB (2x) and recliner (3x) indep without DME    Ambulation/Gait Ambulation/Gait assistance: Supervision Gait Distance (Feet): 400 Feet Assistive device: None Gait Pattern/deviations: Step-through pattern, Decreased stride length Gait velocity: Decreased     General Gait Details: slow, steady gait without DME, supervision for safety due to first time walking; asymptomatic hypotension; nonproductive cough with DOE 2/4  Stairs            Wheelchair Mobility     Tilt Bed    Modified Rankin (Stroke Patients Only)       Balance Overall balance assessment: Needs assistance Sitting-balance support: No upper extremity supported, Feet supported Sitting balance-Leahy Scale: Good     Standing balance support: No upper extremity supported, During functional activity Standing balance-Leahy Scale: Good                               Pertinent Vitals/Pain Pain Assessment Pain Assessment: No/denies pain    Home Living Family/patient expects to be discharged to:: Private residence Living Arrangements: Alone Available Help at Discharge: Friend(s);Available PRN/intermittently Type of Home: Apartment Home Access: Level entry       Home Layout: One level Home Equipment: None      Prior Function Prior Level of Function : Independent/Modified Independent;Driving             Mobility Comments: indep without DME, reports working part-time sometimes (difficult to have active job due to chronic asthma) ADLs Comments: indep ADL/iADLs     Extremity/Trunk Assessment   Upper Extremity Assessment Upper Extremity Assessment: Overall  WFL for tasks assessed (notable muscle atrophy throughout)    Lower Extremity Assessment Lower Extremity Assessment: Overall WFL for tasks assessed (notable muscle atrophy throughout)    Cervical / Trunk  Assessment Cervical / Trunk Assessment: Other exceptions Cervical / Trunk Exceptions: notable muscle atrophy throughout  Communication   Communication Communication: No apparent difficulties    Cognition Arousal: Alert Behavior During Therapy: WFL for tasks assessed/performed, Flat affect   PT - Cognitive impairments: No apparent impairments                         Following commands: Intact       Cueing Cueing Techniques: Verbal cues     General Comments General comments (skin integrity, edema, etc.): sitting BP 104/67, standing BP 91/61, standing 2-min BP 93/63, post-ambulation BP 95/56; HR 120s; SpO2 95% on RA. educ re: role of acute PT, POC, activity recommendations, pulmonary hygiene, importance of ambulating with nursing staff (notified RN)    Exercises     Assessment/Plan    PT Assessment Patient does not need any further PT services  PT Problem List         PT Treatment Interventions      PT Goals (Current goals can be found in the Care Plan section)  Acute Rehab PT Goals PT Goal Formulation: All assessment and education complete, DC therapy    Frequency       Co-evaluation               AM-PAC PT 6 Clicks Mobility  Outcome Measure Help needed turning from your back to your side while in a flat bed without using bedrails?: None Help needed moving from lying on your back to sitting on the side of a flat bed without using bedrails?: None Help needed moving to and from a bed to a chair (including a wheelchair)?: None Help needed standing up from a chair using your arms (e.g., wheelchair or bedside chair)?: A Little Help needed to walk in hospital room?: A Little Help needed climbing 3-5 steps with a railing? : A Little 6 Click Score: 21    End of Session Equipment Utilized During Treatment: Gait belt Activity Tolerance: Patient tolerated treatment well Patient left: in chair;with call bell/phone within reach;with chair alarm set Nurse  Communication: Mobility status PT Visit Diagnosis: Other abnormalities of gait and mobility (R26.89)    Time: 8777-8756 PT Time Calculation (min) (ACUTE ONLY): 21 min   Charges:   PT Evaluation $PT Eval Moderate Complexity: 1 Mod   PT General Charges $$ ACUTE PT VISIT: 1 Visit       Darice Almas, PT, DPT Acute Rehabilitation Services  Personal: Secure Chat Rehab Office: [phone]  Darice LITTIE Almas 07/19/2024, 2:31 PM

## 2024-07-19 NOTE — Plan of Care (Signed)

## 2024-07-19 NOTE — Assessment & Plan Note (Signed)
 Significant weight lose w/ poor PO intake in the last 6 months - Continue Ensure w/ meal - Dietitian consult and follow - Continue Regular diet

## 2024-07-19 NOTE — Assessment & Plan Note (Signed)
 Phos 1.8 this morning  - Will hold on for replace Sodium Phosphate  30 mmol IV and continue monitoring d/t pt allergic reaction w/ unknown cause previously  - AM Phos

## 2024-07-19 NOTE — Assessment & Plan Note (Signed)
 Tachycardia on this admission. CTPE worked up and US  LEs were negative for PE and DVT. No significant finding on 07/17/24 Echo  - Continue Cardiac monitoring - AM BMP w/ Mg

## 2024-07-19 NOTE — Progress Notes (Signed)
 "    Daily Progress Note Intern Pager: [phone]  Patient name: Shane Fleming Medical record number: [id number] Date of birth: [birth year] Age: 54 y.o. Gender: male  Primary Care Provider: [facility], [place] Consultants: Pulmonology  Code Status: Full Code   Pt Overview and Major Events to Date:  07/14/24 : Admitted 07/16/24 : BB to MICU after Hypotensive episodes  07/19/24 : Transition back to floor  [name] is a 54YO Male w/ PMH of severe persistent w/ eosinophil feathers bronchiectasis ( he was on IL-5 therapy previously but has been losing access to [employer]), sever malnutrition, s/p appectomy in 2016. He has been admitted for pneumonia w/ bronchiectasis with acute on chronic cough. His hospitalization was c/b hypotension required ICU admission.   Assessment & Plan Pneumonia Eosinophilic asthma Bronchiectasis (HCC) Chronic cough Doing well on RA this morning - Pulmonology consulted, appreciate recommendations  - Completed Cefepime  2 g every 8 hours (07/14/24 - 07/17/23 ) d/t itching  - Transitioned form UNASYN  ( 1/12 - 1/16) to Aiugmentin on 07/18/24 d/t rash c/o allergic reaction - Continue Augmentin  875-125 mg Q12 Hrs (1/16-1/25 ) for total of 14 days ABX  - Lower respiratory sputum w/ + Klebsiella P w/ concerned for Actinomyces   - Possible Bronch during this admission  - AFB culture still pending on collection   - Continue Prednisone  1 mg daily w/ breakfast (1/13 - 1/16)   - Continue ANORO ELLIPTA  1 puff daily   - Continue Singulair  10 mg Daily at bedtime  - Blood cx no growth  - Vitals per floor - AM BMP  Prediabetes Last A1C on 1/12 was 6.4 in from 6.1 in 9/25 BG in 100s  - CBG ACHS. May need SSI while on steroid if BG become elevated Protein-calorie malnutrition, severe Significant weight lose w/ poor PO intake in the last 6 months - Continue Ensure w/ meal - Dietitian consult and follow - Continue Regular diet  Hypophosphatemia Phos  1.8 this morning  - Will hold on for replace Sodium Phosphate  30 mmol IV and continue monitoring d/t pt allergic reaction w/ unknown cause previously  - AM Phos  Itching C/o generalize itching this AM. Per RN his itching seems to r/t IV ABX - Pharmacy consult for medication cause - Atarax  TID PRN started OVN - Continue Eucerin BID  - Continue Hydrocortisone  cream 1% BID - Will continue to monitor  Tachycardia Tachycardia on this admission. CTPE worked up and US  LEs were negative for PE and DVT. No significant finding on 07/17/24 Echo  - Continue Cardiac monitoring - AM BMP w/ Mg  Hiccup C/o intermittent hicc-up during round. Was given Thorazine  IV during ICU admission - Continue Baclofen    FEN/GI: Regular  PPx: Xerelto 10 mg ( No pork product)  Dispo:Pending clinical improvement and PT/OT rec   Subjective:  Doing well this morning on RA. C/O generalize rash and feeling like heart racing Last BM was 07/18/24 per pt.   Objective: Temp:  [98.2 F (36.8 C)-98.9 F (37.2 C)] 98.5 F (36.9 C) (01/17 0911) Pulse Rate:  [98-112] 112 (01/17 0911) Resp:  [19-20] 19 (01/17 0911) BP: (97-111)/(53-65) 97/58 (01/17 0911) SpO2:  [91 %-98 %] 98 % (01/17 0911)  Physical Exam Pulmonary:     Breath sounds: Examination of the right-upper field reveals rales. Examination of the left-upper field reveals rales. Rales present.  Abdominal:     Palpations: Abdomen is soft.  Skin:    Capillary Refill: Capillary refill takes  less than 2 seconds.  Neurological:     Mental Status: He is alert and oriented to person, place, and time.      Laboratory: Most recent CBC Lab Results  Component Value Date   WBC 15.3 (H) 07/19/2024   HGB 11.8 (L) 07/19/2024   HCT 35.3 (L) 07/19/2024   MCV 84.7 07/19/2024   PLT 319 07/19/2024   Most recent BMP    Latest Ref Rng & Units 07/19/2024    5:10 AM  BMP  Glucose 70 - 99 mg/dL 878   BUN 6 - 20 mg/dL 14   Creatinine 9.38 - 1.24 mg/dL 9.05   Sodium 864  - 854 mmol/L 137   Potassium 3.5 - 5.1 mmol/L 3.8   Chloride 98 - 111 mmol/L 100   CO2 22 - 32 mmol/L 28   Calcium  8.9 - 10.3 mg/dL 8.4     Suzen Elder B, DO 07/19/2024, 11:28 AM  PGY-1, [facility] FPTS Intern pager: [phone], text pages welcome Secure chat group [facility] Teaching Service   "

## 2024-07-19 NOTE — Assessment & Plan Note (Signed)
 C/o generalize itching this AM. Per RN his itching seems to r/t IV ABX - Pharmacy consult for medication cause - Atarax  TID PRN started OVN - Continue Eucerin BID  - Continue Hydrocortisone  cream 1% BID - Will continue to monitor

## 2024-07-19 NOTE — Assessment & Plan Note (Signed)
 Last A1C on 1/12 was 6.4 in from 6.1 in 9/25 BG in 100s  - CBG ACHS. May need SSI while on steroid if BG become elevated

## 2024-07-20 LAB — GLUCOSE, CAPILLARY
Glucose-Capillary: 132 mg/dL — ABNORMAL HIGH (ref 70–99)
Glucose-Capillary: 114 mg/dL — ABNORMAL HIGH (ref 70–99)

## 2024-07-20 LAB — CBC
HCT: 38.1 % — ABNORMAL LOW (ref 39.0–52.0)
Hemoglobin: 12.8 g/dL — ABNORMAL LOW (ref 13.0–17.0)
MCH: 28.6 pg (ref 26.0–34.0)
MCHC: 33.6 g/dL (ref 30.0–36.0)
MCV: 85 fL (ref 80.0–100.0)
Platelets: 330 K/uL (ref 150–400)
RBC: 4.48 MIL/uL (ref 4.22–5.81)
RDW: 15.3 % (ref 11.5–15.5)
WBC: 16.5 K/uL — ABNORMAL HIGH (ref 4.0–10.5)
nRBC: 0 % (ref 0.0–0.2)

## 2024-07-20 LAB — BASIC METABOLIC PANEL WITH GFR
Anion gap: 8 (ref 5–15)
BUN: 16 mg/dL (ref 6–20)
CO2: 29 mmol/L (ref 22–32)
Calcium: 8.9 mg/dL (ref 8.9–10.3)
Chloride: 101 mmol/L (ref 98–111)
Creatinine, Ser: 0.83 mg/dL (ref 0.61–1.24)
GFR, Estimated: >60 mL/min
Glucose, Bld: 82 mg/dL (ref 70–99)
Potassium: 3.8 mmol/L (ref 3.5–5.1)
Sodium: 138 mmol/L (ref 135–145)

## 2024-07-20 LAB — PHOSPHORUS: Phosphorus: 2.6 mg/dL (ref 2.5–4.6)

## 2024-07-20 LAB — MAGNESIUM: Magnesium: 1.9 mg/dL (ref 1.7–2.4)

## 2024-07-20 MED ORDER — HYDROXYZINE HCL 10 MG PO TABS
10.0000 mg | ORAL_TABLET | Freq: Three times a day (TID) | ORAL | Status: DC | PRN
  Administered 2024-07-20 – 2024-07-21 (×2): 10 mg via ORAL
  Filled 2024-07-20 (×2): qty 1

## 2024-07-20 NOTE — Assessment & Plan Note (Signed)
 Stable on room air -Appreciate pulmonology recommendations  - Completed Cefepime  2 g every 8 hours (07/14/24 - 07/17/23 )   - Transitioned from  UNASYN  ( 1/12 - 1/16) to Augmentin  on 07/18/24 d/t rash c/o allergic reaction - Continue Augmentin  875-125 mg Q12 Hrs (1/16-1/25 ) for total of 14 days ABX  - Lower respiratory sputum w/ + Klebsiella P w/ concerned for Actinomyces   - outpatient follow-up with pulmonology - AFB culture still pending on collection   - Continue Prednisone  1 mg daily w/ breakfast   - Continue ANORO ELLIPTA  1 puff daily   - Continue Singulair  10 mg Daily at bedtime  -Baclofen  5 mg 3 times daily for hiccups - Blood cx no growth  - Vitals per floor - AM BMP

## 2024-07-20 NOTE — Evaluation (Signed)
 Occupational Therapy Evaluation and Discharge Patient Details Name: Shane Fleming MRN: [id number] DOB: Dec 08, 1970 Today's Date: 07/20/2024   History of Present Illness   54 y.o. male admitted 07/14/24 with worsening SOB and cough, recent PNA dx. Workup for CAP, bronchiectasis with exacerbation. Course complicated by hypotension, tachycardia 1/14 requiring transfer to ICU; suspect anaphylactic vs medication-induced shock. PMH includes severe eosinophilic asthma, bronchiectasis, prediabetes.     Clinical Impressions Pt endorses that he is completing ADL tasks at baseline level, does not indicate a need for acute OT services. Educated Pt on energy conservation strategies and Pt returned verbalization of understanding on implementation into ADL routine in home environment. Anticipate Pt can return home without OT follow-up once medically cleared for d/c. OT to sign off.      If plan is discharge home, recommend the following:   A little help with bathing/dressing/bathroom;Assist for transportation     Functional Status Assessment   Patient has had a recent decline in their functional status and demonstrates the ability to make significant improvements in function in a reasonable and predictable amount of time.     Equipment Recommendations   None recommended by OT     Recommendations for Other Services         Precautions/Restrictions   Precautions Precautions: Fall;Other (comment) Recall of Precautions/Restrictions: Intact Precaution/Restrictions Comments: watch BP, HR Restrictions Weight Bearing Restrictions Per Provider Order: No     Mobility Bed Mobility Overal bed mobility: Modified Independent                  Transfers Overall transfer level: Independent Equipment used: None                      Balance Overall balance assessment: No apparent balance deficits (not formally assessed)                                          ADL either performed or assessed with clinical judgement   ADL Overall ADL's : At baseline                                             Vision Patient Visual Report: No change from baseline Vision Assessment?: No apparent visual deficits     Perception         Praxis         Pertinent Vitals/Pain Pain Assessment Pain Assessment: No/denies pain     Extremity/Trunk Assessment Upper Extremity Assessment Upper Extremity Assessment: Overall WFL for tasks assessed   Lower Extremity Assessment Lower Extremity Assessment: Defer to PT evaluation   Cervical / Trunk Assessment Cervical / Trunk Assessment: Normal   Communication Communication Communication: No apparent difficulties   Cognition Arousal: Alert Behavior During Therapy: WFL for tasks assessed/performed Cognition: No apparent impairments                               Following commands: Intact       Cueing  General Comments   Cueing Techniques: Verbal cues  Educated Pt on energy conservation strategies. Pt returned vebalization of understanding   Exercises     Shoulder Instructions      Home Living Family/patient expects to be discharged  to:: Private residence Living Arrangements: Alone Available Help at Discharge: Friend(s);Available PRN/intermittently Type of Home: Apartment Home Access: Level entry     Home Layout: One level     Bathroom Shower/Tub: Producer, Television/film/video: Standard     Home Equipment: None          Prior Functioning/Environment Prior Level of Function : Independent/Modified Independent;Driving             Mobility Comments: indep without DME, reports working part-time sometimes (difficult to have active job due to chronic asthma) ADLs Comments: indep ADL/iADLs    OT Problem List:     OT Treatment/Interventions:        OT Goals(Current goals can be found in the care plan section)   Acute Rehab OT  Goals Patient Stated Goal: to go home   OT Frequency:       Co-evaluation              AM-PAC OT 6 Clicks Daily Activity     Outcome Measure Help from another person eating meals?: None Help from another person taking care of personal grooming?: None Help from another person toileting, which includes using toliet, bedpan, or urinal?: None Help from another person bathing (including washing, rinsing, drying)?: None Help from another person to put on and taking off regular upper body clothing?: None Help from another person to put on and taking off regular lower body clothing?: None 6 Click Score: 24   End of Session    Activity Tolerance: Patient tolerated treatment well Patient left: in bed;with call bell/phone within reach                   Time: 8587-8577 OT Time Calculation (min): 10 min Charges:  OT General Charges $OT Visit: 1 Visit OT Evaluation $OT Eval Low Complexity: 1 Low  Maurilio CROME, OTR/L.  [facility] Acute Rehabilitation  Office: [phone]   Maurilio PARAS Collen Vincent 07/20/2024, 4:02 PM

## 2024-07-20 NOTE — Plan of Care (Signed)
 FMTS Interim Progress Note  S: Reevaluated patient. Reports itching has improved a bit today. Tried to get up and walk but had worsening cough with this. Feels his eyes are a bit puffy. Continues to have some redness/warmth in b/l lower extremities.  O: BP 105/64 (BP Location: Left Arm)   Pulse (!) 103   Temp 98.3 F (36.8 C) (Oral)   Resp 18   Ht 5' 11 (1.803 m)   Wt 61 kg   SpO2 95%   BMI 18.76 kg/m    Gen: NAD, sitting on side of bed HEENT: bilateral eyes slightly injected, very mild puffiness under eyes CV: mildy tachcyardic regular rhythm Pulm: CTAB. intermittent cough. No wheezing Ext: no significant lower extremity edema. Both legs are warm to touch and erythematous but unchanged from prior exams.   A/P:  Itchy/puffy eyes and warm/erythematous legs ?allergic reaction but unclear source, possibly augmentin   Given that his symptoms overall are stable from prior, prefer to continue current course and monitor for any worsening. I've added on PRN atarax  should his itching worsen. Continue eye drops.  If developing worsening signs of allergy /anaphylaxis would probably DC augmentin  and speak with ID about other antibiotic coverage, now having been on cephalosporin and IV Unasyn  already, might need meropenem instead.  Romelle Booty, MD 07/20/2024, 11:19 AM PGY-3, [facility] Family Medicine Service pager [phone]

## 2024-07-20 NOTE — Assessment & Plan Note (Signed)
-   Continue Eucerin BID  - Continue Hydrocortisone  cream 1% BID - Will continue to monitor

## 2024-07-20 NOTE — Progress Notes (Signed)
 "    Daily Progress Note Intern Pager: [phone]  Patient name: Shane Fleming Medical record number: [id number] Date of birth: February 12, 1971 Age: 54 y.o. Gender: male  Primary Care Provider: [facility], [place] Consultants: Pulmonology Code Status: Full  Pt Overview and Major Events to Date:  1/12 admitted 1/14 transferred to ICU due to hypotension (medication induced versus anaphylactic shock), required pressors 1/17 transferred back to floor  Medical Decision Making:  Shane Fleming is a 54 y.o. male admitted for pneumonia with bronchiectasis, acute on chronic cough.  Required brief ICU admission due to hypotension but has since been stable. Pertinent PMH/PSH includes eosinophilic asthma, bronchiectasis, previously on IL-5 therapy but lost access to Dupixent  due to insurance, severe malnutrition.  Assessment & Plan Pneumonia Eosinophilic asthma Bronchiectasis (HCC) Chronic cough Stable on room air -Appreciate pulmonology recommendations  - Completed Cefepime  2 g every 8 hours (07/14/24 - 07/17/23 )   - Transitioned from  UNASYN  ( 1/12 - 1/16) to Augmentin  on 07/18/24 d/t rash c/o allergic reaction - Continue Augmentin  875-125 mg Q12 Hrs (1/16-1/25 ) for total of 14 days ABX  - Lower respiratory sputum w/ + Klebsiella P w/ concerned for Actinomyces   - outpatient follow-up with pulmonology - AFB culture still pending on collection   - Continue Prednisone  1 mg daily w/ breakfast   - Continue ANORO ELLIPTA  1 puff daily   - Continue Singulair  10 mg Daily at bedtime  -Baclofen  5 mg 3 times daily for hiccups - Blood cx no growth  - Vitals per floor - AM BMP  Protein-calorie malnutrition, severe Significant weight lose w/ poor PO intake in the last 6 months - Continue Ensure w/ meal - Dietitian consult and follow - Continue Regular diet  Itching - Continue Eucerin BID  - Continue Hydrocortisone  cream 1% BID - Will continue to monitor  Tachycardia Improved. CTPE  worked up and US  LEs were negative for PE and DVT. No significant finding on 07/17/24 Echo  - Continue Cardiac monitoring - AM BMP w/ Mg    FEN/GI: reg PPx: Xarelto  Dispo:Home likely today or tomorrow  Subjective:  NAEON. Feeling well this morning. Really feels like baclofen  has helped a lot with hiccups. Would prefer to stay another day in the hospital.  Objective: Temp:  [98.2 F (36.8 C)-98.5 F (36.9 C)] 98.3 F (36.8 C) (01/17 2014) Pulse Rate:  [102-112] 102 (01/17 2014) Resp:  [18-19] 18 (01/17 2014) BP: (97-106)/(51-69) 104/51 (01/17 2014) SpO2:  [93 %-100 %] 100 % (01/17 2014) Physical Exam: General: NAD  Cardiovascular: slightly tachycardic regular rhythm no murmur Respiratory: normal WOB on RA Abdomen: soft NTND Extremities: no significant lower ext edema  Laboratory: Most recent CBC Lab Results  Component Value Date   WBC 16.5 (H) 07/20/2024   HGB 12.8 (L) 07/20/2024   HCT 38.1 (L) 07/20/2024   MCV 85.0 07/20/2024   PLT 330 07/20/2024   Most recent BMP    Latest Ref Rng & Units 07/20/2024    4:00 AM  BMP  Glucose 70 - 99 mg/dL 82   BUN 6 - 20 mg/dL 16   Creatinine 9.38 - 1.24 mg/dL 9.16   Sodium 864 - 854 mmol/L 138   Potassium 3.5 - 5.1 mmol/L 3.8   Chloride 98 - 111 mmol/L 101   CO2 22 - 32 mmol/L 29   Calcium  8.9 - 10.3 mg/dL 8.9     Acid-fast smear/culture in process   Romelle Booty, MD 07/20/2024, 7:25 AM  PGY-3,  [facility] Family [facility] FPTS Intern pager: [phone], text pages welcome Secure chat group [facility] Teaching Service  "

## 2024-07-20 NOTE — Assessment & Plan Note (Signed)
 Significant weight lose w/ poor PO intake in the last 6 months - Continue Ensure w/ meal - Dietitian consult and follow - Continue Regular diet

## 2024-07-20 NOTE — Plan of Care (Signed)

## 2024-07-20 NOTE — Assessment & Plan Note (Signed)
 Improved. CTPE worked up and US  LEs were negative for PE and DVT. No significant finding on 07/17/24 Echo  - Continue Cardiac monitoring - AM BMP w/ Mg

## 2024-07-21 ENCOUNTER — Other Ambulatory Visit (HOSPITAL_COMMUNITY): Payer: Self-pay

## 2024-07-21 LAB — BASIC METABOLIC PANEL WITH GFR
Anion gap: 8 (ref 5–15)
BUN: 18 mg/dL (ref 6–20)
CO2: 29 mmol/L (ref 22–32)
Calcium: 8.9 mg/dL (ref 8.9–10.3)
Chloride: 99 mmol/L (ref 98–111)
Creatinine, Ser: 0.88 mg/dL (ref 0.61–1.24)
GFR, Estimated: >60 mL/min
Glucose, Bld: 85 mg/dL (ref 70–99)
Potassium: 3.7 mmol/L (ref 3.5–5.1)
Sodium: 137 mmol/L (ref 135–145)

## 2024-07-21 LAB — CBC
HCT: 37.3 % — ABNORMAL LOW (ref 39.0–52.0)
Hemoglobin: 12.7 g/dL — ABNORMAL LOW (ref 13.0–17.0)
MCH: 28.7 pg (ref 26.0–34.0)
MCHC: 34 g/dL (ref 30.0–36.0)
MCV: 84.2 fL (ref 80.0–100.0)
Platelets: 333 K/uL (ref 150–400)
RBC: 4.43 MIL/uL (ref 4.22–5.81)
RDW: 15.4 % (ref 11.5–15.5)
WBC: 14.3 K/uL — ABNORMAL HIGH (ref 4.0–10.5)
nRBC: 0 % (ref 0.0–0.2)

## 2024-07-21 LAB — CULTURE, RESPIRATORY W GRAM STAIN

## 2024-07-21 LAB — GLUCOSE, CAPILLARY: Glucose-Capillary: 167 mg/dL — ABNORMAL HIGH (ref 70–99)

## 2024-07-21 LAB — MAGNESIUM: Magnesium: 2 mg/dL (ref 1.7–2.4)

## 2024-07-21 LAB — PHOSPHORUS: Phosphorus: 4.2 mg/dL (ref 2.5–4.6)

## 2024-07-21 MED ORDER — ENSURE PLUS HIGH PROTEIN PO LIQD
237.0000 mL | Freq: Three times a day (TID) | ORAL | 0 refills | Status: AC
  Filled 2024-07-21: qty 21330, 30d supply, fill #0

## 2024-07-21 MED ORDER — HYDROXYZINE HCL 10 MG PO TABS
10.0000 mg | ORAL_TABLET | Freq: Three times a day (TID) | ORAL | 0 refills | Status: DC | PRN
  Filled 2024-07-21: qty 30, 10d supply, fill #0

## 2024-07-21 MED ORDER — BUDESON-GLYCOPYRROL-FORMOTEROL 160-9-4.8 MCG/ACT IN AERO
2.0000 | INHALATION_SPRAY | Freq: Two times a day (BID) | RESPIRATORY_TRACT | 0 refills | Status: AC
  Filled 2024-07-21: qty 10.7, 30d supply, fill #0

## 2024-07-21 MED ORDER — BUDESON-GLYCOPYRROL-FORMOTEROL 160-9-4.8 MCG/ACT IN AERO
2.0000 | INHALATION_SPRAY | Freq: Two times a day (BID) | RESPIRATORY_TRACT | Status: DC
  Filled 2024-07-21: qty 5.9

## 2024-07-21 MED ORDER — ADULT MULTIVITAMIN W/MINERALS CH
1.0000 | ORAL_TABLET | Freq: Every day | ORAL | 0 refills | Status: AC
  Filled 2024-07-21: qty 30, 30d supply, fill #0

## 2024-07-21 MED ORDER — BACLOFEN 5 MG PO TABS
5.0000 mg | ORAL_TABLET | Freq: Three times a day (TID) | ORAL | 0 refills | Status: AC
  Filled 2024-07-21: qty 30, 10d supply, fill #0

## 2024-07-21 MED ORDER — AMOXICILLIN-POT CLAVULANATE 875-125 MG PO TABS
1.0000 | ORAL_TABLET | Freq: Two times a day (BID) | ORAL | 0 refills | Status: AC
  Filled 2024-07-21: qty 14, 7d supply, fill #0

## 2024-07-21 MED ORDER — PREDNISONE 1 MG PO TABS
1.0000 mg | ORAL_TABLET | Freq: Every day | ORAL | 0 refills | Status: AC
  Filled 2024-07-21: qty 30, 30d supply, fill #0

## 2024-07-21 NOTE — Assessment & Plan Note (Deleted)
 Stable on room air -Appreciate pulmonology recommendations  - Completed Cefepime  2 g every 8 hours (07/14/24 - 07/17/23 )   - Transitioned from  UNASYN  ( 1/12 - 1/16) to Augmentin  on 07/18/24 d/t rash c/o allergic reaction - Continue Augmentin  875-125 mg Q12 Hrs (1/16-1/25 ) for total of 14 days ABX  - Lower respiratory sputum w/ + Klebsiella P w/ concerned for Actinomyces   - outpatient follow-up with pulmonology - AFB culture still pending on collection   - Continue Prednisone  1 mg daily w/ breakfast   - Continue ANORO ELLIPTA  1 puff daily   - Continue Singulair  10 mg Daily at bedtime  - augmentin  till 01/25 for total of 14 days  - 30 days 1mg  prednisone  daily until sees pulm -getting pulm appt sooner  - med education on d.c -Baclofen  5 mg 3 times daily for hiccups - Blood cx no growth  - Vitals per floor - AM BMP

## 2024-07-21 NOTE — Assessment & Plan Note (Deleted)
 Significant weight lose w/ poor PO intake in the last 6 months - Continue Ensure w/ meal - Dietitian consult and follow - Continue Regular diet  DME ensure on d/c??

## 2024-07-21 NOTE — Assessment & Plan Note (Deleted)
 Improved. CTPE worked up and US  LEs were negative for PE and DVT. No significant finding on 07/17/24 Echo  - Continue Cardiac monitoring - AM BMP w/ Mg

## 2024-07-21 NOTE — TOC Transition Note (Addendum)
 Transition of Care [facility]) - Discharge Note   Patient Details  Name: Shane Fleming MRN: [id number] Date of Birth: 1970-10-26  Transition of Care [facility]) CM/SW Contact:  Tom-Johnson, Duilio Heritage Daphne, RN Phone Number: 07/21/2024, 10:13 AM   Clinical Narrative:     Patient is scheduled for discharge today.  Readmission Risk Assessment done. Hospital f/u and discharge instructions on AVS. Prescriptions sent to [facility] pharmacy and patient will receive meds prior discharge. No ICM needs or recommendations noted. Patient drove self to the ED and will transport self home at discharge.  No further ICM needs noted.  11:10- MATCH done to assist with Prescription Meds from [facility].  No further TOC needs noted.       Final next level of care: Home/Self Care Barriers to Discharge: Barriers Resolved   Patient Goals and CMS Choice Patient states their goals for this hospitalization and ongoing recovery are:: To return home CMS Medicare.gov Compare Post Acute Care list provided to:: Patient Choice offered to / list presented to : NA      Discharge Placement                Patient to be transferred to facility by: Drove self.      Discharge Plan and Services Additional resources added to the After Visit Summary for                  DME Arranged: N/A DME Agency: NA       HH Arranged: NA HH Agency: NA        Social Drivers of Health (SDOH) Interventions SDOH Screenings   Food Insecurity: No Food Insecurity (07/15/2024)  Housing: Unknown (07/15/2024)  Transportation Needs: No Transportation Needs (07/15/2024)  Utilities: Not At Risk (07/15/2024)  Depression (PHQ2-9): Low Risk (03/26/2024)  Tobacco Use: Low Risk (07/14/2024)  Health Literacy: Adequate Health Literacy (07/17/2024)     Readmission Risk Interventions    07/21/2024   10:12 AM 07/17/2024    1:29 PM  Readmission Risk Prevention Plan  Post Dischage Appt Complete   Medication Screening Complete    Transportation Screening Complete Complete  PCP or Specialist Appt within 5-7 Days  Complete  Home Care Screening  Complete  Medication Review (RN CM)  Referral to Pharmacy

## 2024-07-21 NOTE — Progress Notes (Signed)
 DISCHARGE NOTE HOME Shane Fleming to be discharged Home per MD order. Discussed prescriptions and follow up appointments with the patient. Prescriptions given to patient; medication list explained in detail. Patient verbalized understanding.  Skin clean, dry and intact without evidence of skin break down, no evidence of skin tears noted. IV catheter discontinued intact. Site without signs and symptoms of complications. Dressing and pressure applied. Pt denies pain at the site currently. No complaints noted.  Patient free of lines, drains, and wounds.   An After Visit Summary (AVS) was printed and given to the patient. Patient escorted via wheelchair, and discharged home via private auto.  Peyton SHAUNNA Pepper, RN

## 2024-07-21 NOTE — Progress Notes (Signed)
 MATCH MEDICATION ASSISTANCE CARD Pharmacies please call [phone] for claim processing assistance.  Rx BIN: [id number] Rx Group: [id number] Rx PCN: [id number] Relationship Code: 1 Person Code: 01  Patient ID (MRN): [id number]          Patient Name: Shane Fleming        Patient DOB: )05-Jul-1970     Discharge Date: 07/21/2024  Expiration Date: 07/29/2024 (must be filled within 8 days of discharge)

## 2024-07-21 NOTE — Discharge Summary (Addendum)
 "  Family Medicine Teaching [facility] Discharge Summary  Patient name: Shane Fleming Medical record number: [id number] Date of birth: 01/05/1971 Age: 54 y.o. Gender: male Date of Admission: 07/14/2024  Date of Discharge: 07/21/2024 Admitting Physician: Otto ONEIDA Fairly, MD  Primary Care Provider: [facility], [place] Consultants: Pulmonology  Indication for Hospitalization: respiratory distress  Brief Hospital Course:  Shane Fleming is a 54 y.o.male with a history of severe persistent asthma w/eosinophilic features and bronchiectasis (previously on IL-5 therapy but lost access to Dupixent  w/o insurance), and severe malnutrition who was admitted to the [facility] Teaching Service at [facility] for pneumonia w/ bronchiectasis and acute on chronic cough. His hospital course is detailed below:  Pneumonia  Eosinophilic asthma Patient w/severe persistent eosinophilic asthma, presents to the ED for acute on chronic cough with SOB. He has not been able to afford Dupixent  injection to help w/ asthma exacerbation. Pulmonology was consulted given his history.  He received cefepime .  There was some concern for allergic response/itching related to cefepime , and he was switched to Unasyn  and ultimately was discharged on Augmentin  with an end date of 07/27/24. His lower respiratory cultures grew Klebsiella and acid-fast culture/smear are pending upon discharge. Patient will need to follow-up with pulmonology closely outpatient to consider further workup including possible bronchoscopy to rule out actinomyces (prior culture was positive for GPC however never speciated). Patient was continued on inhalers and 1 mg prednisone .  Hypotension / Tachycardia During this admission patient was noted to be in sinus tachycardia in the 120s with symptoms. CTA PE and DVT US  were negative. Received 1 dose of IV 5 mg metoprolol  resulting in hypotensive episode. However there was also concern for possible anaphylactic  reaction as patient had concern for many allergies including cefepime  and Unasyn  with associated rash, possible that this may have also been anaphylactic. Due to his hypotension he required a brief ICU stay during which he received vasopressors for about 1 day after which she was weaned off and transferred back to the floor.  Malnutrition Pt w/ recent weight loss and poor PO intake. Starting Ensure w/meals during this admission. Dietitian was consulted.  Itching/Rash Pt w/generalize itching and papular rash without known cause during this admission. Eucerin BID and Hydrocortisone  cream 1% BID were started with relief. Patient was instructed to continue these creams as needed on discharge.   Other chronic conditions were medically managed with home medications and formulary alternatives as necessary  PCP Follow-up Recommendations: Ensure pulmonology follow-up Follow-up acid-fast culture/smear and repeat respiratory culture Ensure compliance with inhalers, prednisone  Patient benefited from baclofen  for hiccups F/u on rash progression Consider allergy  testing   Discharge Diagnoses/Problem List:  Hospital Problems      Hospital     * (Principal) Pneumonia     Chronic cough     Bronchiectasis (HCC)     Protein-calorie malnutrition, severe     Eosinophilic asthma     Prediabetes     Hiccup     Chronic health problem     Itching     Hypophosphatemia     Tachycardia     Sepsis due to pneumonia [facility])   Disposition: Home  Discharge Condition: Stable  Discharge Exam:  General: A&O, NAD, sitting up comfortably in hospital bed  HEENT: No sign of trauma, EOM grossly intact Cardiac: RRR, no m/r/g Respiratory: CTAB, normal WOB, no w/c/r GI: Soft, NTTP, non-distended  Extremities: NTTP, no peripheral edema Skin: dry, peeling skin to torso from rash, improved papular rash to  arms Neuro: moves all four extremities appropriately, alert and oriented x3, CN II-XII grossly intact Psych:  Appropriate mood and affect   Significant Procedures:  Echocardiogram 01/15: IMPRESSIONS   1. Left ventricular ejection fraction, by estimation, is 60 to 65%. The  left ventricle has normal function. The left ventricle has no regional  wall motion abnormalities. Left ventricular diastolic parameters were  normal.   2. Right ventricular systolic function is normal. The right ventricular  size is normal.   3. The mitral valve is normal in structure. No evidence of mitral valve  regurgitation. No evidence of mitral stenosis.   4. The aortic valve is tricuspid. Aortic valve regurgitation is not  visualized. No aortic stenosis is present.   5. The inferior vena cava is normal in size with greater than 50%  respiratory variability, suggesting right atrial pressure of 3 mmHg.   VAS US  LOWER EXTREMITY VENOUS (DVT)  01/14: Summary: BILATERAL: - No evidence of deep vein thrombosis seen in the lower extremities, bilaterally. - No evidence of popliteal cyst, bilaterally.  Significant Labs and Imaging:  Recent Labs  Lab 07/20/24 0400 07/21/24 0338  WBC 16.5* 14.3*  HGB 12.8* 12.7*  HCT 38.1* 37.3*  PLT 330 333   Recent Labs  Lab 07/20/24 0400 07/21/24 0338  NA 138 137  K 3.8 3.7  CL 101 99  CO2 29 29  GLUCOSE 82 85  BUN 16 18  CREATININE 0.83 0.88  CALCIUM  8.9 8.9  MG 1.9 2.0  PHOS 2.6 4.2   Discharge Medications:  Allergies as of 07/21/2024       Reactions   Bactoshield Chg [chlorhexidine  Gluconate] Hives   CHG bath cloths   Porcine (pork) Protein-containing Drug Products    Religious        Medication List     TAKE these medications    albuterol  108 (90 Base) MCG/ACT inhaler Commonly known as: VENTOLIN  HFA Inhale 2 puffs into the lungs every 6 (six) hours as needed for wheezing or shortness of breath (when travelling).   albuterol  (2.5 MG/3ML) 0.083% nebulizer solution Commonly known as: PROVENTIL  Inhale 3 mLs into the lungs every 4 (four) hours as needed for  wheezing or shortness of breath.   amoxicillin -clavulanate 875-125 MG tablet Commonly known as: AUGMENTIN  Take 1 tablet by mouth every 12 (twelve) hours for 7 days.   Baclofen  5 MG Tabs Take 1 tablet (5 mg total) by mouth 3 (three) times daily.   budesonide  0.5 MG/2ML nebulizer solution Commonly known as: Pulmicort  Take 2 mLs (0.5 mg total) by nebulization 2 (two) times daily.   budesonide -glycopyrrolate -formoterol  160-9-4.8 MCG/ACT Aero inhaler Commonly known as: BREZTRI  Inhale 2 puffs into the lungs 2 (two) times daily.   feeding supplement Liqd Take 237 mLs by mouth 3 (three) times daily between meals.   hydrOXYzine  10 MG tablet Commonly known as: ATARAX  Take 1 tablet (10 mg total) by mouth 3 (three) times daily as needed for itching.   montelukast  10 MG tablet Commonly known as: SINGULAIR  Take 1 tablet (10 mg total) by mouth at bedtime.   multivitamin with minerals Tabs tablet Take 1 tablet by mouth daily.   predniSONE  1 MG tablet Commonly known as: DELTASONE  Take 1 tablet (1 mg total) by mouth daily with breakfast.   umeclidinium-vilanterol 62.5-25 MCG/ACT Aepb Commonly known as: Anoro Ellipta  Inhale 1 puff into the lungs daily.       Discharge Instructions: Please refer to Patient Instructions section of EMR for full details.  Patient  was counseled important signs and symptoms that should prompt return to medical care, changes in medications, dietary instructions, activity restrictions, and follow up appointments.   Follow-Up Appointments:  Follow-up Information     [facility], [place] Follow up in 3 day(s).   Contact information: [address] [place] [address] [phone]                Lupie Credit, DO 07/21/2024, 12:16 PM PGY-1, [place] [facility]  Upper Level Addendum: I have seen and evaluated this patient along with Dr. Lupie and reviewed the above note, making necessary revisions as appropriate. I  agree with the medical decision making and physical exam as noted above.  Lauraine Norse, DO [place] [facility], PGY-2 07/21/24 1:48 PM  FPTS Intern pager: [phone], text pages welcome Secure chat group [facility] [place] Teaching Service   "

## 2024-07-21 NOTE — Assessment & Plan Note (Deleted)
-   Continue Eucerin BID  - Continue Hydrocortisone  cream 1% BID - Will continue to monitor  Hydrocortisone  cream on d/c

## 2024-07-22 ENCOUNTER — Other Ambulatory Visit: Payer: Self-pay

## 2024-07-22 ENCOUNTER — Telehealth: Payer: Self-pay | Admitting: *Deleted

## 2024-07-22 LAB — ACID FAST SMEAR (AFB, MYCOBACTERIA): Acid Fast Smear: NEGATIVE

## 2024-07-22 NOTE — Transitions of Care (Post Inpatient/ED Visit) (Signed)
 "  07/22/2024  Name: Shane Fleming MRN: [id number] DOB: August 21, 1970  Today's TOC FU Call Status: Today's TOC FU Call Status:: Successful TOC FU Call Completed TOC FU Call Complete Date: 07/22/24  Patient's Name and Date of Birth confirmed. Name, DOB  Transition Care Management Follow-up Telephone Call Date of Discharge: 07/21/24 Discharge Facility: [facility]) Type of Discharge: Inpatient Admission Primary Inpatient Discharge Diagnosis:: Pneumonia How have you been since you were released from the hospital?: Better Any questions or concerns?: No  Items Reviewed: Did you receive and understand the discharge instructions provided?: Yes Medications obtained,verified, and reconciled?: Yes (Medications Reviewed) Any new allergies since your discharge?: No Dietary orders reviewed?: Yes Type of Diet Ordered:: Regular Do you have support at home?: Yes People in Home [RPT]: friend(s) Name of Support/Comfort Primary Source: I have a lot of friends  Medications Reviewed Today: Medications Reviewed Today     Reviewed by Lucky Andrea LABOR, RN (Registered Nurse) on 07/22/24 at 1014  Med List Status: <None>   Medication Order Taking? Sig Documenting Provider Last Dose Status Informant  albuterol  (PROVENTIL ) (2.5 MG/3ML) 0.083% nebulizer solution [id number] Yes Inhale 3 mLs into the lungs every 4 (four) hours as needed for wheezing or shortness of breath. Theotis Haze ORN, NP  Active Self, Pharmacy Records  albuterol  (VENTOLIN  HFA) 108 [phone] Base) MCG/ACT inhaler [id number] Yes Inhale 2 puffs into the lungs every 6 (six) hours as needed for wheezing or shortness of breath (when travelling). Kara Dorn NOVAK, MD  Active Self, Pharmacy Records  amoxicillin -clavulanate (AUGMENTIN ) 875-125 MG tablet [id number] Yes Take 1 tablet by mouth every 12 (twelve) hours for 7 days. Lafe Domino, DO  Active   Baclofen  5 MG TABS [id number] Yes Take 1 tablet (5 mg total) by mouth 3 (three) times daily. Lafe Domino, DO  Active   budesonide  (PULMICORT ) 0.5 MG/2ML nebulizer solution [id number]  Take 2 mLs (0.5 mg total) by nebulization 2 (two) times daily.  Patient not taking: Reported on 07/22/2024   Kara Dorn NOVAK, MD  Active Self, Pharmacy Records  budesonide -glycopyrrolate -formoterol  (BREZTRI ) 160-9-4.8 MCG/ACT AERO inhaler [id number] Yes Inhale 2 puffs into the lungs 2 (two) times daily. Lupie Credit, DO  Active   feeding supplement (ENSURE PLUS HIGH PROTEIN) LIQD [id number] Yes Take 237 mLs by mouth 3 (three) times daily between meals. Lafe Domino, DO  Active   hydrOXYzine  (ATARAX ) 10 MG tablet [id number] Yes Take 1 tablet (10 mg total) by mouth 3 (three) times daily as needed for itching. Lafe Domino, DO  Active   montelukast  (SINGULAIR ) 10 MG tablet [id number]  Take 1 tablet (10 mg total) by mouth at bedtime.  Patient not taking: Reported on 07/22/2024   Theotis Haze ORN, NP  Active Self, Pharmacy Records  Multiple Vitamin (MULTIVITAMIN WITH MINERALS) TABS tablet [id number] Yes Take 1 tablet by mouth daily. Lafe Domino, DO  Active   predniSONE  (DELTASONE ) 1 MG tablet [id number] Yes Take 1 tablet (1 mg total) by mouth daily with breakfast. Lafe Domino, DO  Active   umeclidinium-vilanterol (ANORO ELLIPTA ) 62.5-25 MCG/ACT AEPB [id number]  Inhale 1 puff into the lungs daily.  Patient not taking: Reported on 07/22/2024   Kara Dorn NOVAK, MD  Active Self, Pharmacy Records            Home Care and Equipment/Supplies: Were Home Health Services Ordered?: No Any new equipment or medical supplies ordered?: No  Functional Questionnaire: Do you need assistance with bathing/showering or dressing?: No Do you need assistance with meal preparation?:  No Do you need assistance with eating?: No Do you have difficulty maintaining continence: No Do you need assistance with getting out of bed/getting out of a chair/moving?: No Do you have difficulty managing or taking your medications?: No  Follow up  appointments reviewed: PCP Follow-up appointment confirmed?: Yes Date of PCP follow-up appointment?: 07/28/24 Follow-up Provider: PCP Haze Servant, NP Specialist Hospital Follow-up appointment confirmed?: Yes Date of Specialist follow-up appointment?: 07/30/24 Follow-Up Specialty Provider:: Pulmonology Do you need transportation to your follow-up appointment?: No Do you understand care options if your condition(s) worsen?: Yes-patient verbalized understanding  SDOH Interventions Today    Flowsheet Row Most Recent Value  SDOH Interventions   Food Insecurity Interventions Intervention Not Indicated  Housing Interventions Intervention Not Indicated  Transportation Interventions Intervention Not Indicated  Utilities Interventions Intervention Not Indicated    Goals Addressed             This Visit's Progress    VBCI Transitions of Care (TOC) Care Plan       Problems:  Recent Hospitalization for treatment of Pneumonia  Goal:  Over the next 30 days, the patient will not experience hospital readmission  Interventions:  Transitions of Care: Durable Medical Equipment (DME) reviewed with patient/caregiver Doctor Visits  - discussed the importance of doctor visits Post discharge activity limitations prescribed by provider reviewed Reviewed Signs and symptoms of infection SDOH assessment Medication review, discussed the importance of taking antibiotic until complete and taking prednisone  as directed Advised taking all medications to upcoming appointments  Reviewed future appointments including: PCP on 07/28/24 and Pulmonology on 07/30/24-patient aware and planning to attend Updated PCP in Epic Reviewed diet and breathing exercises   Patient Self Care Activities:  Attend all scheduled provider appointments Call pharmacy for medication refills 3-7 days in advance of running out of medications Call provider office for new concerns or questions  Notify RN Care Manager of [facility] call  rescheduling needs Take medications as prescribed   Perform breathing exercises several times a day  Plan:  Telephone follow up appointment with care management team member scheduled for:  07/29/24 at 12:45pm       Discussed and offered 30 day TOC program.  Patient       enrolled.  The patient has been provided with contact information for the care management team and has been advised to call with any health -related questions or concerns.  The patient verbalized understanding with current plan of care.  The patient is directed to their insurance card regarding availability of benefits coverage.    Andrea Dimes RN, BSN [place]  Value-Based [facility] RN Care Manager [phone]  "

## 2024-07-23 ENCOUNTER — Telehealth: Payer: Self-pay

## 2024-07-23 NOTE — Telephone Encounter (Signed)
 Received secure chat from inpatient pharmacy team that patient does not have prescription drug insurance but plan per inpatient team is to discharge patient with Breztri  inhaler.  Hospital Admission 07/14/24 - 07/21/24 for asthma exacerbation and pneumonia.  He was discharged with a Breztri  160-9-4.20mcg/act inhaler. Plan to f/u with Dr. Kara on 07/30/24.     Submitted Patient Assistance Application to [employer] for BREZTRI  along with provider portion, patient portion, medication list. Will update patient when we receive a response.   Phone #: [phone] Fax #: [phone]     Will update when we receive a response.

## 2024-07-25 NOTE — Telephone Encounter (Signed)
 Received a fax from  [employer] regarding an approval for BREZTRI  patient assistance from 07/23/2024 to 07/23/2025. Approval letter sent to scan center.  Phone #: [phone] Fax #: [phone]   Allow t7-10 business days for medication ot ship  Sherry Pennant, PharmD, MPH, BCPS, CPP Clinical Pharmacist

## 2024-07-28 ENCOUNTER — Ambulatory Visit: Payer: Self-pay | Admitting: Nurse Practitioner

## 2024-07-29 ENCOUNTER — Other Ambulatory Visit: Payer: Self-pay

## 2024-07-29 ENCOUNTER — Other Ambulatory Visit: Payer: Self-pay | Admitting: *Deleted

## 2024-07-29 DIAGNOSIS — J8283 Eosinophilic asthma: Secondary | ICD-10-CM

## 2024-07-29 MED ORDER — BENRALIZUMAB 30 MG/ML ~~LOC~~ SOAJ: 30.0000 mg | SUBCUTANEOUS | 3 refills | Status: AC

## 2024-07-29 NOTE — Transitions of Care (Post Inpatient/ED Visit) (Signed)
 " Transition of Care week 2  Visit Note  07/29/2024  Name: Shane Fleming MRN: [id number]          DOB: 1971-06-14  Situation: Patient enrolled in [facility] 30-day program. Visit completed with Mr. Asfaw by telephone.   Background:   Initial Transition Care Management Follow-up Telephone Call Discharge Date and Diagnosis: 07/21/24, Pneumonia   Past Medical History:  Diagnosis Date   Asthma    Prediabetes    Sinusitis 2018    Assessment: Patient Reported Symptoms: Cognitive Cognitive Status: Able to follow simple commands, Alert and oriented to person, place, and time, Normal speech and language skills      Neurological Neurological Review of Symptoms: No symptoms reported    HEENT HEENT Symptoms Reported: No symptoms reported      Cardiovascular Cardiovascular Symptoms Reported: No symptoms reported    Respiratory Respiratory Symptoms Reported: Productive cough Other Respiratory Symptoms: Productive cough in the morning, denies fever Additional Respiratory Details: Scheduled with Pulmonology on 07/30/24. Respiratory Management Strategies: Routine screening, Medication therapy, Coping strategies, Adequate rest Respiratory Self-Management Outcome: 3 (uncertain)  Endocrine Endocrine Symptoms Reported: No symptoms reported    Gastrointestinal Gastrointestinal Symptoms Reported: No symptoms reported      Genitourinary Genitourinary Symptoms Reported: No symptoms reported    Integumentary Integumentary Symptoms Reported: Skin changes Additional Integumentary Details: Dry skin, using a cream, reports drinking a lot of water Skin Management Strategies: Adequate rest, Routine screening Skin Self-Management Outcome: 3 (uncertain) Skin Comment: Advised using warm water when showering instead of hot water.  Musculoskeletal Musculoskelatal Symptoms Reviewed: No symptoms reported        Psychosocial Psychosocial Symptoms Reported: No symptoms reported         There were no  vitals filed for this visit.    Medications Reviewed Today     Reviewed by Lucky Andrea LABOR, RN (Registered Nurse) on 07/29/24 at 1301  Med List Status: <None>   Medication Order Taking? Sig Documenting Provider Last Dose Status Informant  albuterol  (PROVENTIL ) (2.5 MG/3ML) 0.083% nebulizer solution [id number] Yes Inhale 3 mLs into the lungs every 4 (four) hours as needed for wheezing or shortness of breath. Theotis Haze ORN, NP  Active Self, Pharmacy Records  albuterol  (VENTOLIN  HFA) 108 [phone] Base) MCG/ACT inhaler [id number] Yes Inhale 2 puffs into the lungs every 6 (six) hours as needed for wheezing or shortness of breath (when travelling). Kara Dorn NOVAK, MD  Active Self, Pharmacy Records  Baclofen  5 MG TABS [id number] Yes Take 1 tablet (5 mg total) by mouth 3 (three) times daily. Lafe Domino, DO  Active   budesonide  (PULMICORT ) 0.5 MG/2ML nebulizer solution [id number]  Take 2 mLs (0.5 mg total) by nebulization 2 (two) times daily.  Patient not taking: Reported on 07/29/2024   Kara Dorn NOVAK, MD  Active Self, Pharmacy Records  budesonide -glycopyrrolate -formoterol  (BREZTRI ) 160-9-4.8 MCG/ACT AERO inhaler [id number] Yes Inhale 2 puffs into the lungs 2 (two) times daily. Lupie Credit, DO  Active   feeding supplement (ENSURE PLUS HIGH PROTEIN) LIQD [id number] Yes Take 237 mLs by mouth 3 (three) times daily between meals. Lafe Domino, DO  Active   hydrOXYzine  (ATARAX ) 10 MG tablet [id number] Yes Take 1 tablet (10 mg total) by mouth 3 (three) times daily as needed for itching. Lafe Domino, DO  Active   montelukast  (SINGULAIR ) 10 MG tablet [id number]  Take 1 tablet (10 mg total) by mouth at bedtime.  Patient not taking: Reported on 07/29/2024   Theotis Haze ORN, NP  Active Self, Pharmacy  Records  Multiple Vitamin (MULTIVITAMIN WITH MINERALS) TABS tablet [id number] Yes Take 1 tablet by mouth daily. Lafe Domino, DO  Active   predniSONE  (DELTASONE ) 1 MG tablet [id number] Yes Take 1 tablet (1 mg  total) by mouth daily with breakfast. Lafe Domino, DO  Active   umeclidinium-vilanterol (ANORO ELLIPTA ) 62.5-25 MCG/ACT AEPB [id number]  Inhale 1 puff into the lungs daily.  Patient not taking: Reported on 07/29/2024   Kara Dorn NOVAK, MD  Active Self, Pharmacy Records            Recommendation:   Continue Current Plan of Care  Follow Up Plan:   Telephone follow-up in 1 week  Andrea Dimes RN, BSN [place]  Value-Based Care [facility] Health RN Care Manager [phone]     "

## 2024-07-29 NOTE — Patient Instructions (Signed)
 Visit Information  Thank you for taking time to visit with me today. Please don't hesitate to contact me if I can be of assistance to you before our next scheduled telephone appointment.  Following is a copy of your care plan:   Goals Addressed             This Visit's Progress    VBCI Transitions of Care (TOC) Care Plan       Problems:  Recent Hospitalization for treatment of Pneumonia  Goal:  Over the next 30 days, the patient will not experience hospital readmission  Interventions:  Transitions of Care: Doctor Visits  - discussed the importance of doctor visits Reviewed Signs and symptoms of infection Medication review, discussed the importance of taking prednisone  in the morning to avoid sleep difficulties Advised taking all medications to upcoming appointments  Reviewed future appointments including: PCP on 08/13/24 and Pulmonology on 07/30/24-patient aware and planning to attend Advised recording triggers and discussing with provider   Patient Self Care Activities:  Attend all scheduled provider appointments Call pharmacy for medication refills 3-7 days in advance of running out of medications Call provider office for new concerns or questions  Notify RN Care Manager of [facility] call rescheduling needs Take medications as prescribed   Perform breathing exercises several times a day  Plan:  Telephone follow up appointment with care management team member scheduled for:  08/06/24 at 11am        The patient verbalized understanding of instructions, educational materials, and care plan provided today and agreed to receive a mailed copy of patient instructions, educational materials, and care plan.   Telephone follow up appointment with care management team member scheduled for:08/06/24 at 11am  Please call the care guide team at [phone] if you need to cancel or reschedule your appointment.   Please call 1-800-273-TALK (toll free, 24 hour hotline) go to [facility] Urgent [facility] [address], [place] [phone]) call 911 if you are experiencing a Mental Health or Behavioral Health Crisis or need someone to talk to.  Andrea Dimes RN, BSN [place]  Value-Based Care [facility] Health RN Care Manager [phone]

## 2024-07-29 NOTE — Progress Notes (Signed)
 Rx for Fasenra  triaged to [facility].

## 2024-07-30 ENCOUNTER — Other Ambulatory Visit: Payer: Self-pay

## 2024-07-30 ENCOUNTER — Ambulatory Visit: Payer: Self-pay | Admitting: Pulmonary Disease

## 2024-07-30 ENCOUNTER — Encounter: Payer: Self-pay | Admitting: Pulmonary Disease

## 2024-07-30 VITALS — BP 114/70 | HR 119 | Ht 71.0 in | Wt 132.2 lb

## 2024-07-30 DIAGNOSIS — R21 Rash and other nonspecific skin eruption: Secondary | ICD-10-CM

## 2024-07-30 DIAGNOSIS — J4489 Other specified chronic obstructive pulmonary disease: Secondary | ICD-10-CM

## 2024-07-30 DIAGNOSIS — J8283 Eosinophilic asthma: Secondary | ICD-10-CM

## 2024-07-30 DIAGNOSIS — H538 Other visual disturbances: Secondary | ICD-10-CM

## 2024-07-30 DIAGNOSIS — J470 Bronchiectasis with acute lower respiratory infection: Secondary | ICD-10-CM

## 2024-07-30 LAB — COMPREHENSIVE METABOLIC PANEL WITH GFR
ALT: 34 U/L (ref 3–53)
AST: 30 U/L (ref 5–37)
Albumin: 3.6 g/dL (ref 3.5–5.2)
Alkaline Phosphatase: 79 U/L (ref 39–117)
BUN: 16 mg/dL (ref 6–23)
CO2: 33 meq/L — ABNORMAL HIGH (ref 19–32)
Calcium: 9.6 mg/dL (ref 8.4–10.5)
Chloride: 100 meq/L (ref 96–112)
Creatinine, Ser: 0.9 mg/dL (ref 0.40–1.50)
GFR: 95.77 mL/min
Glucose, Bld: 100 mg/dL — ABNORMAL HIGH (ref 70–99)
Potassium: 3.8 meq/L (ref 3.5–5.1)
Sodium: 137 meq/L (ref 135–145)
Total Bilirubin: 0.3 mg/dL (ref 0.2–1.2)
Total Protein: 8.9 g/dL — ABNORMAL HIGH (ref 6.0–8.3)

## 2024-07-30 MED ORDER — PREDNISONE 10 MG PO TABS
ORAL_TABLET | ORAL | 0 refills | Status: AC
  Filled 2024-07-30: qty 30, 12d supply, fill #0

## 2024-07-30 MED ORDER — BREZTRI AEROSPHERE 160-9-4.8 MCG/ACT IN AERO: INHALATION_SPRAY | RESPIRATORY_TRACT | Status: AC

## 2024-07-30 MED ORDER — DOXYCYCLINE HYCLATE 100 MG PO TABS
100.0000 mg | ORAL_TABLET | Freq: Two times a day (BID) | ORAL | 0 refills | Status: AC
  Filled 2024-07-30: qty 28, 14d supply, fill #0

## 2024-07-30 NOTE — Progress Notes (Signed)
 "  Established Patient Pulmonology Office Visit   Subjective:  Patient ID: Shane Fleming, male    DOB: Feb 26, 1971  MRN: [id number]  CC:  Chief Complaint  Patient presents with   Medical Management of Chronic Issues    [facility] - since he has left his skin has been really itchy / cough / eye bothering him     Discussed the use of AI scribe software for clinical note transcription with the patient, who gave verbal consent to proceed.  History of Present Illness Shane Fleming is a 54 year old male with bronchiectasis and severe persistent eosinophilic asthma who presents for follow-up.   He has felt unwell since discharge and notes that his condition is worse at home than in the hospital. He has significant difficulty walking due to symptoms.  He developed a diffuse, painful rash with dry skin after his second recent hospitalization. The rash causes substantial discomfort and disrupts sleep.  He has severe insomnia. He also reports new visual changes with blurriness and with mild eye pain. His eye drops are finished from the hospital   He has no appetite and is unable to eat. He has a persistent, relentless cough with green sputum, worse in the mornings.  He is using Breztri , which is now finished, and is still taking prednisone . He completed a course of Augmentin . He performs nebulizer treatments twice daily and uses a flutter valve about ten times a day.  He was hospitalized twice recently. The rash began during the second stay, and he feels his breathing and overall condition have worsened since that discharge.        ROS   Current Medications[1]      Objective:  BP 114/70   Pulse (!) 119   Ht 5' 11 (1.803 m) Comment: per pt  Wt 132 lb 3.2 oz (60 kg)   SpO2 92%   BMI 18.44 kg/m     Physical Exam Constitutional:      General: He is not in acute distress.    Appearance: Normal appearance. He is ill-appearing.  Eyes:     General: No scleral  icterus.    Conjunctiva/sclera: Conjunctivae normal.  Cardiovascular:     Rate and Rhythm: Normal rate and regular rhythm.  Pulmonary:     Breath sounds: Wheezing present. No rhonchi or rales.  Musculoskeletal:     Right lower leg: No edema.     Left lower leg: No edema.  Skin:    General: Skin is warm and dry.  Neurological:     General: No focal deficit present.      Diagnostic Review:  Last CBC Lab Results  Component Value Date   WBC 14.3 (H) 07/21/2024   HGB 12.7 (L) 07/21/2024   HCT 37.3 (L) 07/21/2024   MCV 84.2 07/21/2024   MCH 28.7 07/21/2024   RDW 15.4 07/21/2024   PLT 333 07/21/2024   Last metabolic panel Lab Results  Component Value Date   GLUCOSE 85 07/21/2024   NA 137 07/21/2024   K 3.7 07/21/2024   CL 99 07/21/2024   CO2 29 07/21/2024   BUN 18 07/21/2024   CREATININE 0.88 07/21/2024   GFRNONAA >60 07/21/2024   CALCIUM  8.9 07/21/2024   PHOS 4.2 07/21/2024   PROT 5.7 (L) 07/17/2024   ALBUMIN 2.4 (L) 07/17/2024   LABGLOB 4.3 03/26/2024   AGRATIO 1.2 08/18/2022   BILITOT 0.6 07/17/2024   ALKPHOS 63 07/17/2024   AST 37 07/17/2024   ALT 55 (  H) 07/17/2024   ANIONGAP 8 07/21/2024       Assessment & Plan:   Assessment & Plan Asthma-COPD overlap syndrome (HCC)     Bronchiectasis with acute lower respiratory infection (HCC)  Orders:   Comp Met (CMET); Future   CBC with Differential/Platelet; Future   IgE; Future   predniSONE  (DELTASONE ) 10 MG tablet; Take 4 tablets (40 mg total) by mouth daily with breakfast for 3 days, THEN 3 tablets (30 mg total) daily with breakfast for 3 days, THEN 2 tablets (20 mg total) daily with breakfast for 3 days, THEN 1 tablet (10 mg total) daily with breakfast for 3 days.   doxycycline  (VIBRA -TABS) 100 MG tablet; Take 1 tablet (100 mg total) by mouth 2 (two) times daily.  Eosinophilic asthma     Blurry vision  Orders:   Ambulatory referral to Ophthalmology   Assessment and Plan Assessment &  Plan Asthma-COPD overlap syndrome with bronchiectasis and acute lower respiratory infection Persistent productive cough. Awaiting Fasenra  injection. Approved for Breztri  inhaler via patient assistance. Recent hospitalizations for respiratory issues. - Continue nebulizer treatments twice daily. - Use flutter valve 2-3 times daily - Increase prednisone  to 4 tabs daily for 3 days, then 3 tabs daily for 3 days, taper to 10 mg. - Prescribed doxycycline  for 14 days, 1 tab twice daily. - Await Fasenra  injection approval and initiation. - Ensure Breztri  inhaler is received via patient assistance program.  Drug-induced skin eruption Dry skin and rash post-hospitalization, likely medication-related. Rash widespread, especially on back. Initial reaction led to ICU admission.  - Continue current management as skin condition is expected to improve over time.  Blurry vision Blurry vision since hospitalization, with mild discomfort. - Referred to ophthalmologist for further evaluation. - Use over-the-counter Visine eye drops for lubrication.      Return in about 1 month (around 08/30/2024) for f/u visit Dr. Kara.   Dorn KATHEE Kara, MD     [1]  Current Outpatient Medications:    albuterol  (PROVENTIL ) (2.5 MG/3ML) 0.083% nebulizer solution, Inhale 3 mLs into the lungs every 4 (four) hours as needed for wheezing or shortness of breath., Disp: 75 mL, Rfl: 12   albuterol  (VENTOLIN  HFA) 108 (90 Base) MCG/ACT inhaler, Inhale 2 puffs into the lungs every 6 (six) hours as needed for wheezing or shortness of breath (when travelling)., Disp: 20.1 g, Rfl: 6   Baclofen  5 MG TABS, Take 1 tablet (5 mg total) by mouth 3 (three) times daily., Disp: 30 each, Rfl: 0   benralizumab  (FASENRA  PEN) 30 MG/ML prefilled autoinjector, Inject 1 mL (30 mg total) into the skin every 8 (eight) weeks. ** patient assistance program **, Disp: 1 mL, Rfl: 3   budesonide  (PULMICORT ) 0.5 MG/2ML nebulizer solution, Take 2 mLs (0.5  mg total) by nebulization 2 (two) times daily., Disp: 120 mL, Rfl: 11   budesonide -glycopyrrolate -formoterol  (BREZTRI  AEROSPHERE) 160-9-4.8 MCG/ACT AERO inhaler, 4 samples, Disp: , Rfl:    budesonide -glycopyrrolate -formoterol  (BREZTRI ) 160-9-4.8 MCG/ACT AERO inhaler, Inhale 2 puffs into the lungs 2 (two) times daily., Disp: 10.7 g, Rfl: 0   doxycycline  (VIBRA -TABS) 100 MG tablet, Take 1 tablet (100 mg total) by mouth 2 (two) times daily., Disp: 28 tablet, Rfl: 0   feeding supplement (ENSURE PLUS HIGH PROTEIN) LIQD, Take 237 mLs by mouth 3 (three) times daily between meals., Disp: 21330 mL, Rfl: 0   hydrOXYzine  (ATARAX ) 10 MG tablet, Take 1 tablet (10 mg total) by mouth 3 (three) times daily as needed for itching., Disp: 30 tablet,  Rfl: 0   montelukast  (SINGULAIR ) 10 MG tablet, Take 1 tablet (10 mg total) by mouth at bedtime., Disp: 90 tablet, Rfl: 3   Multiple Vitamin (MULTIVITAMIN WITH MINERALS) TABS tablet, Take 1 tablet by mouth daily., Disp: 30 tablet, Rfl: 0   predniSONE  (DELTASONE ) 1 MG tablet, Take 1 tablet (1 mg total) by mouth daily with breakfast., Disp: 30 tablet, Rfl: 0   predniSONE  (DELTASONE ) 10 MG tablet, Take 4 tablets (40 mg total) by mouth daily with breakfast for 3 days, THEN 3 tablets (30 mg total) daily with breakfast for 3 days, THEN 2 tablets (20 mg total) daily with breakfast for 3 days, THEN 1 tablet (10 mg total) daily with breakfast for 3 days., Disp: 30 tablet, Rfl: 0  "

## 2024-07-30 NOTE — Assessment & Plan Note (Addendum)
" °  Orders:   Comp Met (CMET); Future   CBC with Differential/Platelet; Future   IgE; Future   predniSONE  (DELTASONE ) 10 MG tablet; Take 4 tablets (40 mg total) by mouth daily with breakfast for 3 days, THEN 3 tablets (30 mg total) daily with breakfast for 3 days, THEN 2 tablets (20 mg total) daily with breakfast for 3 days, THEN 1 tablet (10 mg total) daily with breakfast for 3 days.   doxycycline  (VIBRA -TABS) 100 MG tablet; Take 1 tablet (100 mg total) by mouth 2 (two) times daily.  "

## 2024-07-30 NOTE — Patient Instructions (Addendum)
 Continue breztri  inhaler 2 puffs twice daily - rinse mouth out after each use  Continue nebulizer treatments twice daily follow by flutter valve  Start doxycycline  twice daily for 14 days  Start extended steroid taper 40mg  daily x 3 days 30mg  daily x 3 days 20mg  daily x 3 days 10mg  daily x 3 days  Then 10mg  daily thereafter  We will follow up on the Fasenra  injections  Follow up in 1 month, call sooner if needed

## 2024-07-30 NOTE — Assessment & Plan Note (Addendum)
 SABRA

## 2024-07-31 ENCOUNTER — Other Ambulatory Visit: Payer: Self-pay

## 2024-07-31 LAB — CBC WITH DIFFERENTIAL/PLATELET
Basophils Absolute: 0.1 10*3/uL (ref 0.0–0.1)
Basophils Relative: 0.8 % (ref 0.0–3.0)
Eosinophils Absolute: 2.1 10*3/uL — ABNORMAL HIGH (ref 0.0–0.7)
Eosinophils Relative: 25.4 % — ABNORMAL HIGH (ref 0.0–5.0)
HCT: 42.1 % (ref 39.0–52.0)
Hemoglobin: 13.7 g/dL (ref 13.0–17.0)
Lymphocytes Relative: 24.6 % (ref 12.0–46.0)
Lymphs Abs: 2 10*3/uL (ref 0.7–4.0)
MCHC: 32.5 g/dL (ref 30.0–36.0)
MCV: 86.9 fl (ref 78.0–100.0)
Monocytes Absolute: 0.7 10*3/uL (ref 0.1–1.0)
Monocytes Relative: 8.4 % (ref 3.0–12.0)
Neutro Abs: 3.4 10*3/uL (ref 1.4–7.7)
Neutrophils Relative %: 40.8 % — ABNORMAL LOW (ref 43.0–77.0)
Platelets: 412 10*3/uL — ABNORMAL HIGH (ref 150.0–400.0)
RBC: 4.84 Mil/uL (ref 4.22–5.81)
RDW: 17.1 % — ABNORMAL HIGH (ref 11.5–15.5)
WBC: 8.3 10*3/uL (ref 4.0–10.5)

## 2024-07-31 LAB — ACID FAST CULTURE WITH REFLEXED SENSITIVITIES (MYCOBACTERIA): Acid Fast Culture: POSITIVE — AB

## 2024-07-31 LAB — IGE: IgE (Immunoglobulin E), Serum: 1349 kU/L — ABNORMAL HIGH

## 2024-07-31 LAB — ACID FAST ID BY PCR AND SUSCEPTIBILITIES
M Tuberculosis Complex: NEGATIVE
M avium complex: POSITIVE — AB

## 2024-08-01 ENCOUNTER — Telehealth: Payer: Self-pay | Admitting: Nurse Practitioner

## 2024-08-01 ENCOUNTER — Telehealth: Payer: Self-pay

## 2024-08-01 NOTE — Telephone Encounter (Signed)
 error

## 2024-08-01 NOTE — Telephone Encounter (Signed)
 Will discuss at next appointment

## 2024-08-01 NOTE — Telephone Encounter (Signed)
 Per secure chat with Dr. Kara, plan to start Fasenra . Patient would benefit from sample to avoid delays in care.   Called patient to discuss Fasenra  sample. He has been approved for free Fasenra  through patient assistance since June 2025 from chart review. He reports he stopped Fasenra  several months ago because it stopped coming in the mail. Unclear if he started Fasenra  previously or if he had prior doses.   Plan to START Fasenra  30mg  [place] every 4 weeks on Week 0, Week 4, Week 8. Then continue every 8 weeks thereafter.   Patient plans to come to clinic to receive Fasenra  sample.   He asks about a medication for hiccups. Reports hiccups worse overnight.   From last hospitalization discharge note:  PCP Follow-up Recommendations: Ensure pulmonology follow-up Follow-up acid-fast culture/smear and repeat respiratory culture Ensure compliance with inhalers, prednisone  Patient benefited from baclofen  for hiccups  Appears he had Rx for baclofen  but no more refills.   Advised to call PCP office to discuss request.   Patient verbalizes understanding and agreement with plan.

## 2024-08-01 NOTE — Telephone Encounter (Signed)
 Provided pt sample of Fasenra  Syringe 30 mg/mL, quantity 1 syringe:  NDC: 9689-8269-14 Lot: [id number] Exp: 8/26

## 2024-08-01 NOTE — Telephone Encounter (Unsigned)
 Copied from CRM #[id number]. Topic: Clinical - Medication Question >> Aug 01, 2024 10:40 AM Darshell M wrote: Reason for CRM: Patient reporting provider asked him to call to request medication for hiccups but nor recent visits. Patient has appointment 2/11.Patient CB# [phone]

## 2024-08-06 ENCOUNTER — Telehealth: Payer: Self-pay | Admitting: *Deleted

## 2024-08-06 ENCOUNTER — Encounter: Payer: Self-pay | Admitting: *Deleted

## 2024-08-07 ENCOUNTER — Encounter: Payer: Self-pay | Admitting: *Deleted

## 2024-08-07 NOTE — Telephone Encounter (Signed)
 Received fax from [facility] stating they do not have active rx on file for Fasenra .

## 2024-08-08 ENCOUNTER — Emergency Department (HOSPITAL_COMMUNITY)
Admission: EM | Admit: 2024-08-08 | Discharge: 2024-08-08 | Disposition: A | Discharge status: Home / Self Care | Payer: Self-pay | Source: Home / Self Care | Attending: Emergency Medicine | Admitting: Emergency Medicine

## 2024-08-08 ENCOUNTER — Encounter: Payer: Self-pay | Admitting: *Deleted

## 2024-08-08 ENCOUNTER — Encounter (HOSPITAL_COMMUNITY): Payer: Self-pay

## 2024-08-08 ENCOUNTER — Emergency Department (HOSPITAL_COMMUNITY): Payer: Self-pay

## 2024-08-08 ENCOUNTER — Other Ambulatory Visit: Payer: Self-pay

## 2024-08-08 DIAGNOSIS — L299 Pruritus, unspecified: Secondary | ICD-10-CM

## 2024-08-08 LAB — CBC WITH DIFFERENTIAL/PLATELET
Abs Immature Granulocytes: 0.03 10*3/uL (ref 0.00–0.07)
Basophils Absolute: 0 10*3/uL (ref 0.0–0.1)
Basophils Relative: 0 %
Eosinophils Absolute: 0 10*3/uL (ref 0.0–0.5)
Eosinophils Relative: 0 %
HCT: 42.2 % (ref 39.0–52.0)
Hemoglobin: 14.3 g/dL (ref 13.0–17.0)
Immature Granulocytes: 1 %
Lymphocytes Relative: 25 %
Lymphs Abs: 1.5 10*3/uL (ref 0.7–4.0)
MCH: 29.5 pg (ref 26.0–34.0)
MCHC: 33.9 g/dL (ref 30.0–36.0)
MCV: 87 fL (ref 80.0–100.0)
Monocytes Absolute: 0.8 10*3/uL (ref 0.1–1.0)
Monocytes Relative: 13 %
Neutro Abs: 3.6 10*3/uL (ref 1.7–7.7)
Neutrophils Relative %: 61 %
Platelets: 327 10*3/uL (ref 150–400)
RBC: 4.85 MIL/uL (ref 4.22–5.81)
RDW: 16.4 % — ABNORMAL HIGH (ref 11.5–15.5)
WBC: 5.9 10*3/uL (ref 4.0–10.5)
nRBC: 0 % (ref 0.0–0.2)

## 2024-08-08 LAB — COMPREHENSIVE METABOLIC PANEL WITH GFR
ALT: 29 U/L (ref 0–44)
AST: 37 U/L (ref 15–41)
Albumin: 3.2 g/dL — ABNORMAL LOW (ref 3.5–5.0)
Alkaline Phosphatase: 76 U/L (ref 38–126)
Anion gap: 12 (ref 5–15)
BUN: 20 mg/dL (ref 6–20)
CO2: 24 mmol/L (ref 22–32)
Calcium: 8.9 mg/dL (ref 8.9–10.3)
Chloride: 100 mmol/L (ref 98–111)
Creatinine, Ser: 0.96 mg/dL (ref 0.61–1.24)
GFR, Estimated: >60 mL/min
Glucose, Bld: 86 mg/dL (ref 70–99)
Potassium: 3.9 mmol/L (ref 3.5–5.1)
Sodium: 136 mmol/L (ref 135–145)
Total Bilirubin: 0.4 mg/dL (ref 0.0–1.2)
Total Protein: 7.6 g/dL (ref 6.5–8.1)

## 2024-08-08 MED ORDER — HYDROXYZINE HCL 25 MG PO TABS
25.0000 mg | ORAL_TABLET | Freq: Once | ORAL | Status: AC
  Administered 2024-08-08: 25 mg via ORAL
  Filled 2024-08-08: qty 1

## 2024-08-08 MED ORDER — HYDROXYZINE HCL 25 MG PO TABS
25.0000 mg | ORAL_TABLET | Freq: Three times a day (TID) | ORAL | 0 refills | Status: AC
  Filled 2024-08-08: qty 20, 7d supply, fill #0

## 2024-08-08 NOTE — ED Provider Triage Note (Signed)
 Emergency Medicine Provider Triage Evaluation Note  Shane Fleming , a 54 y.o. male  was evaluated in triage.  Pt complains of itching and hands peeling.  Patient reports that when he was discharged from the hospital he noted that his hands started to peel some and he noticed itching all over his body.  On physical exam is noted that he has peeling on his hands however there is none anywhere else.  Patient reports he also has a cough.  He was in the hospital for pneumonia and reports that he still has a cough.  Patient was stable in triage. Review of Systems  Positive: Itching Negative:   Physical Exam  BP 105/79 (BP Location: Left Arm)   Pulse (!) 109   Temp 97.9 F (36.6 C)   Resp (!) 23   Ht 5' 11 (1.803 m)   Wt 60 kg   SpO2 94%   BMI 18.44 kg/m  Gen:   Awake, no distress   Resp:  Normal effort  MSK:   Moves extremities without difficulty  Other:    Medical Decision Making  Medically screening exam initiated at 2:19 PM.  Appropriate orders placed.  Shane Fleming was informed that the remainder of the evaluation will be completed by another provider, this initial triage assessment does not replace that evaluation, and the importance of remaining in the ED until their evaluation is complete.     Shane Fleming, [place] 08/08/24 1421

## 2024-08-08 NOTE — ED Provider Notes (Signed)
 " [place] EMERGENCY DEPARTMENT AT [facility] Provider Note   CSN: [id number] Arrival date & time: 08/08/24  1309     Patient presents with: Pruritis (Peeling Skin)   Shane Fleming is a 54 y.o. male.  Patient with past history significant for asthma, prediabetes presents ED with concerns of pruritus.  Reports he has been having diffuse itching all over his body.  States he was recently hospitalized and at time of discharge home, he did not have any the symptoms.  Reports over the last 4 days has had peeling to the skin of his hands and his feet as well as diffuse body itching.  Denies any recent antibiotics or other therapies since being discharged in the hospital several weeks back.  Denies rash to those areas of redness, swelling, or generalized erythema.  Reports the rash is itchy and not painful.  HPI     Prior to Admission medications  Medication Sig Start Date End Date Taking? Authorizing Provider  hydrOXYzine  (ATARAX ) 25 MG tablet Take 1 tablet (25 mg total) by mouth 3 (three) times daily. 08/08/24  Yes Jessy Cybulski A, PA-C  albuterol  (PROVENTIL ) (2.5 MG/3ML) 0.083% nebulizer solution Inhale 3 mLs into the lungs every 4 (four) hours as needed for wheezing or shortness of breath. 03/26/24   Fleming, Zelda W, NP  albuterol  (VENTOLIN  HFA) 108 (90 Base) MCG/ACT inhaler Inhale 2 puffs into the lungs every 6 (six) hours as needed for wheezing or shortness of breath (when travelling). 09/07/23   Kara Dorn NOVAK, MD  Baclofen  5 MG TABS Take 1 tablet (5 mg total) by mouth 3 (three) times daily. 07/21/24   Lafe Domino, DO  benralizumab  (FASENRA  PEN) 30 MG/ML prefilled autoinjector Inject 1 mL (30 mg total) into the skin every 8 (eight) weeks. ** patient assistance program ** 07/29/24   Kara Dorn NOVAK, MD  budesonide  (PULMICORT ) 0.5 MG/2ML nebulizer solution Take 2 mLs (0.5 mg total) by nebulization 2 (two) times daily. 01/17/24   Kara Dorn NOVAK, MD   budesonide -glycopyrrolate -formoterol  (BREZTRI  AEROSPHERE) 160-9-4.8 MCG/ACT AERO inhaler 4 samples 07/30/24   Kara Dorn NOVAK, MD  budesonide -glycopyrrolate -formoterol  (BREZTRI ) 160-9-4.8 MCG/ACT AERO inhaler Inhale 2 puffs into the lungs 2 (two) times daily. 07/21/24   Majeed, Camie, DO  doxycycline  (VIBRA -TABS) 100 MG tablet Take 1 tablet (100 mg total) by mouth 2 (two) times daily. 07/30/24   Kara Dorn NOVAK, MD  feeding supplement (ENSURE PLUS HIGH PROTEIN) LIQD Take 237 mLs by mouth 3 (three) times daily between meals. 07/21/24   Lafe Domino, DO  montelukast  (SINGULAIR ) 10 MG tablet Take 1 tablet (10 mg total) by mouth at bedtime. 03/26/24   Fleming, Zelda W, NP  Multiple Vitamin (MULTIVITAMIN WITH MINERALS) TABS tablet Take 1 tablet by mouth daily. 07/21/24   Lafe Domino, DO  predniSONE  (DELTASONE ) 1 MG tablet Take 1 tablet (1 mg total) by mouth daily with breakfast. 07/21/24   Lafe Domino, DO  predniSONE  (DELTASONE ) 10 MG tablet Take 4 tablets (40 mg total) by mouth daily with breakfast for 3 days, THEN 3 tablets (30 mg total) daily with breakfast for 3 days, THEN 2 tablets (20 mg total) daily with breakfast for 3 days, THEN 1 tablet (10 mg total) daily with breakfast for 3 days. 07/30/24 08/11/24  Kara Dorn NOVAK, MD    Allergies: Bactoshield chg [chlorhexidine  gluconate] and Porcine (pork) protein-containing drug products    Review of Systems  Skin:  Positive for rash.  All other systems reviewed and are negative.  Updated Vital Signs BP 90/74 (BP Location: Right Arm)   Pulse (!) 101   Temp 99.1 F (37.3 C) (Oral)   Resp 18   Ht 5' 11 (1.803 m)   Wt 60 kg   SpO2 95%   BMI 18.44 kg/m   Physical Exam Vitals and nursing note reviewed.  Constitutional:      General: He is not in acute distress.    Appearance: He is well-developed.  HENT:     Head: Normocephalic and atraumatic.  Eyes:     Conjunctiva/sclera: Conjunctivae normal.  Cardiovascular:     Rate and Rhythm:  Normal rate and regular rhythm.     Heart sounds: No murmur heard. Pulmonary:     Effort: Pulmonary effort is normal. No respiratory distress.     Breath sounds: Normal breath sounds. No wheezing, rhonchi or rales.  Abdominal:     General: Abdomen is flat. Bowel sounds are normal. There is no distension.     Palpations: Abdomen is soft.     Tenderness: There is no abdominal tenderness. There is no guarding.  Musculoskeletal:        General: No swelling.     Cervical back: Neck supple.  Skin:    General: Skin is warm and dry.     Capillary Refill: Capillary refill takes less than 2 seconds.     Findings: Lesion present. No erythema or rash.     Comments: No collection of erythematous skin, vesicles, macules, or other abnormal findings except for dry peeling skin to bilateral hands more severe so than what is on bilateral feet  Neurological:     Mental Status: He is alert.  Psychiatric:        Mood and Affect: Mood normal.        (all labs ordered are listed, but only abnormal results are displayed) Labs Reviewed  CBC WITH DIFFERENTIAL/PLATELET - Abnormal; Notable for the following components:      Result Value   RDW 16.4 (*)    All other components within normal limits  COMPREHENSIVE METABOLIC PANEL WITH GFR - Abnormal; Notable for the following components:   Albumin 3.2 (*)    All other components within normal limits    EKG: None  Radiology: DG Chest 2 View Result Date: 08/08/2024 EXAM: 2 VIEW(S) XRAY OF THE CHEST 08/08/2024 02:37:35 PM COMPARISON: 07/17/2024, 07/16/2024 CLINICAL HISTORY: cough Cough. FINDINGS: LUNGS AND PLEURA: Similar hyperexpansion of the lungs with coarsened pulmonary interstitium and bronchiectasis. The pulmonary nodularity noted on the prior chest CT is less conspicuous by plain radiography. Biapical pleural thickening. No pleural effusion. No pneumothorax. HEART AND MEDIASTINUM: No acute abnormality of the cardiac and mediastinal silhouettes. BONES AND  SOFT TISSUES: No acute osseous abnormality. IMPRESSION: 1. Similar hyperexpansion of the lungs with coarsened pulmonary interstitium and bronchiectasis. Otherwise, no acute cardiopulmonary abnormality. Electronically signed by: Rogelia Myers MD 08/08/2024 03:48 PM EST RP Workstation: [id number]     Procedures   Medications Ordered in the ED  hydrOXYzine  (ATARAX ) tablet 25 mg (25 mg Oral Given 08/08/24 1931)                                    Medical Decision Making Amount and/or Complexity of Data Reviewed Labs: ordered.  Risk Prescription drug management.   This patient presents to the ED for concern of pruritus.  Differential diagnosis includes cellulitis, drug rash, anaphylaxis, SJS, TEN  Additional history obtained:  Additional history obtained from chart review   Lab Tests:  I Ordered, and personally interpreted labs.  The pertinent results include: CBC unremarkable, CMP with mild hypoalbuminemia   Imaging Studies ordered:  I ordered imaging studies including chest x-ray I independently visualized and interpreted imaging which showed similar hyperexpansion of the lungs with coarsened pulmonary interstitium and bronchiectasis. Otherwise, no acute cardiopulmonary abnormality. I agree with the radiologist interpretation   Medicines ordered and prescription drug management:  I ordered medication including hydroxyzine  for pruritus Reevaluation of the patient after these medicines showed that the patient improved I have reviewed the patients home medicines and have made adjustments as needed   Problem List / ED Course:  Patient presents the emergency department today with concerns of itching.  Reportedly has had itching of his skin and some skin flaking to his hands and feet.  Reports that has developed after being discharged from the hospital several weeks back when admitted for sepsis.  He denies any of the symptoms at the time of admission.  He does not report  any other symptoms such as fever, chills, body aches, nausea, vomiting, or diarrhea. On exam, patient does have some appears to be flaking of the skin.  No obvious discoloration of the skin noted.  That this is only flaking of the skin.  There is no clear rash.  No diffuse spread towards the legs, arms, or torso. Lab workup unremarkable with no obvious hepatic changes to explain itching.  CBC unremarkable.  Chest x-ray negative for acute findings.  Patient was given Atarax  for itching which did appear to have improvement. After observation, patient been stable.  Advised outpatient follow-up and management with Atarax  at home. Return precautions discussed such as concerns for new or worsening symptoms. Discharged home in stable condition.   Social Determinants of Health:  None  Final diagnoses:  Pruritus    ED Discharge Orders          Ordered    hydrOXYzine  (ATARAX ) 25 MG tablet  3 times daily        08/08/24 2235               Cecily Legrand DELENA DEVONNA 08/08/24 2300    Cottie Donnice PARAS, MD 08/08/24 2317  "

## 2024-08-08 NOTE — Discharge Instructions (Addendum)
 You were seen in the emergency department today for concerns of itchy skin.  You were found to have no obvious rash that appears to be due to infection.  The peeling that you are noticing to your hands and feet may be related to your recent treatment in the hospital with antibiotics.  This does not appear to be a rash to the medications but this is likely due to other factors.  Your labs were thankfully reassuring and normal and your chest x-ray did not show any signs of pneumonia.  I started you on a medication called Atarax  to help with the itching of the skin.  Please take this as prescribed up to 3 times a day.  For any concerns of new or worsening symptoms, return to the emergency department.

## 2024-08-13 ENCOUNTER — Ambulatory Visit: Payer: Self-pay | Admitting: Nurse Practitioner

## 2024-09-18 ENCOUNTER — Ambulatory Visit: Payer: Self-pay | Admitting: Pulmonary Disease

## 2024-10-03 ENCOUNTER — Ambulatory Visit: Payer: Self-pay | Admitting: Pulmonary Disease
